# Patient Record
Sex: Male | Born: 1935 | Race: White | Hispanic: No | Marital: Married | Smoking: Former smoker
Health system: Southern US, Community
[De-identification: ages and names within clinical notes are randomized; demographics above are authoritative.]

## PROBLEM LIST (undated history)

## (undated) DIAGNOSIS — N200 Calculus of kidney: Secondary | ICD-10-CM

## (undated) DIAGNOSIS — E785 Hyperlipidemia, unspecified: Secondary | ICD-10-CM

## (undated) DIAGNOSIS — N3941 Urge incontinence: Secondary | ICD-10-CM

## (undated) DIAGNOSIS — C189 Malignant neoplasm of colon, unspecified: Secondary | ICD-10-CM

## (undated) DIAGNOSIS — I10 Essential (primary) hypertension: Secondary | ICD-10-CM

## (undated) HISTORY — DX: Malignant neoplasm of colon, unspecified: C18.9

## (undated) HISTORY — DX: Hyperlipidemia, unspecified: E78.5

## (undated) HISTORY — DX: Calculus of kidney: N20.0

## (undated) HISTORY — DX: Urge incontinence: N39.41

## (undated) HISTORY — PX: CATARACT EXTRACTION W/ INTRAOCULAR LENS  IMPLANT, BILATERAL: SHX1307

## (undated) HISTORY — DX: Essential (primary) hypertension: I10

---
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
## 1998-02-14 DIAGNOSIS — N200 Calculus of kidney: Secondary | ICD-10-CM

## 1998-02-14 HISTORY — DX: Calculus of kidney: N20.0

## 1998-02-14 HISTORY — PX: LITHOTRIPSY: SUR834

## 2001-02-14 DIAGNOSIS — C189 Malignant neoplasm of colon, unspecified: Secondary | ICD-10-CM

## 2001-02-14 HISTORY — PX: APPENDECTOMY: SHX54

## 2001-02-14 HISTORY — PX: PARTIAL COLECTOMY: SHX5273

## 2001-02-14 HISTORY — DX: Malignant neoplasm of colon, unspecified: C18.9

## 2009-02-14 HISTORY — PX: OTHER SURGICAL HISTORY: SHX169

## 2010-09-07 ENCOUNTER — Ambulatory Visit: Payer: Self-pay | Admitting: Internal Medicine

## 2010-10-08 ENCOUNTER — Encounter: Payer: Self-pay | Admitting: Internal Medicine

## 2010-10-08 ENCOUNTER — Ambulatory Visit (INDEPENDENT_AMBULATORY_CARE_PROVIDER_SITE_OTHER): Payer: Medicare Other | Admitting: Internal Medicine

## 2010-10-08 DIAGNOSIS — I358 Other nonrheumatic aortic valve disorders: Secondary | ICD-10-CM | POA: Insufficient documentation

## 2010-10-08 DIAGNOSIS — Z85038 Personal history of other malignant neoplasm of large intestine: Secondary | ICD-10-CM | POA: Insufficient documentation

## 2010-10-08 DIAGNOSIS — N3941 Urge incontinence: Secondary | ICD-10-CM

## 2010-10-08 DIAGNOSIS — I359 Nonrheumatic aortic valve disorder, unspecified: Secondary | ICD-10-CM

## 2010-10-08 DIAGNOSIS — N200 Calculus of kidney: Secondary | ICD-10-CM | POA: Insufficient documentation

## 2010-10-08 DIAGNOSIS — I10 Essential (primary) hypertension: Secondary | ICD-10-CM

## 2010-10-08 DIAGNOSIS — E785 Hyperlipidemia, unspecified: Secondary | ICD-10-CM | POA: Insufficient documentation

## 2010-10-08 DIAGNOSIS — E1140 Type 2 diabetes mellitus with diabetic neuropathy, unspecified: Secondary | ICD-10-CM | POA: Insufficient documentation

## 2010-10-08 DIAGNOSIS — E119 Type 2 diabetes mellitus without complications: Secondary | ICD-10-CM

## 2010-10-08 DIAGNOSIS — C189 Malignant neoplasm of colon, unspecified: Secondary | ICD-10-CM

## 2010-10-08 LAB — CBC WITH DIFFERENTIAL/PLATELET
Basophils Absolute: 0 10*3/uL (ref 0.0–0.1)
Hemoglobin: 13.1 g/dL (ref 13.0–17.0)
Lymphocytes Relative: 13 % (ref 12.0–46.0)
Monocytes Relative: 6.6 % (ref 3.0–12.0)
Platelets: 286 10*3/uL (ref 150.0–400.0)
RDW: 13.2 % (ref 11.5–14.6)

## 2010-10-08 LAB — BASIC METABOLIC PANEL
Calcium: 10 mg/dL (ref 8.4–10.5)
GFR: 71.27 mL/min (ref >60.00)
Glucose, Bld: 153 mg/dL — ABNORMAL HIGH (ref 70–99)
Sodium: 137 mEq/L (ref 135–145)

## 2010-10-08 LAB — TSH: TSH: 1.37 u[IU]/mL (ref 0.35–5.50)

## 2010-10-08 LAB — HEPATIC FUNCTION PANEL
Alkaline Phosphatase: 88 U/L (ref 39–117)
Bilirubin, Direct: 0.1 mg/dL (ref 0.0–0.3)

## 2010-10-08 LAB — MICROALBUMIN / CREATININE URINE RATIO
Creatinine,U: 135.6 mg/dL
Microalb, Ur: 7.2 mg/dL — ABNORMAL HIGH (ref 0.0–1.9)

## 2010-10-08 LAB — LIPID PANEL
HDL: 47.4 mg/dL (ref >39.00)
Total CHOL/HDL Ratio: 3

## 2010-10-08 NOTE — Assessment & Plan Note (Signed)
BP Readings from Last 3 Encounters:  10/08/10 126/62   Good control Due for labs

## 2010-10-08 NOTE — Assessment & Plan Note (Signed)
Mild Advised using pad for now when out of house

## 2010-10-08 NOTE — Assessment & Plan Note (Signed)
Previously undiagnosed Sounds like mild stenosis or sclerosis Will consider echo

## 2010-10-08 NOTE — Patient Instructions (Signed)
Please request your records for the past 3 years Get your shingles vaccine (zostavax) Please check your sugars 1-2 times per month fasting. Call if they are over 200 regularly

## 2010-10-08 NOTE — Assessment & Plan Note (Signed)
Seems to have good control but doesn't check i advised checking once or twice a month Will check labs

## 2010-10-08 NOTE — Progress Notes (Signed)
Subjective:    Patient ID: Shane Grant, male    DOB: 1931-12-18, 77 y.o.   MRN: [id number]  HPI Establishing here Moved to [facility] about 2.5 months ago  Has diabetes diagnosed some years ago Checked sugars in past but not much lately No hypoglycemic reactions  HTN has been controlled Diagnosed about 15 years ago  High cholesterol for 10 years or so No troubles with the med Has had good numbers  Has noted occ urinary incontinence Gets urgency and then wets underwear before he makes it to toilet Not too bad Kidney stones in past--lithotripsy in past Had been following with urology  Colonic polyps in 2003----cancer there Required partial colectomy then Last colonoscopy in 2010  No current outpatient prescriptions on file prior to visit.    Allergies  Allergen Reactions  . Ancef (Cefazolin Sodium) Nausea Only    Past Medical History  Diagnosis Date  . Hypertension ~1997  . Hyperlipidemia   . Diabetes mellitus   . Colon cancer 2003    Partial colectomy for cancer in polyp  . Kidney stones 2000    lithotripsy  . Urge incontinence of urine     Past Surgical History  Procedure Date  . Tonsillectomy and adenoidectomy 1947  . Appendectomy 2003    with colectomy  . Partial colectomy 2003    ascending colon  . Hepatic lesion 2011    Cryoablation by radiologist  . Lithotripsy 2000    Family History  Problem Relation Age of Onset  . Heart disease Neg Hx   . Hypertension Neg Hx   . Diabetes Neg Hx     History   Social History  . Marital Status: Married    Spouse Name: N/A    Number of Children: 3  . Years of Education: N/A   Occupational History  . Scientific laboratory technician   Social History Main Topics  . Smoking status: Former Smoker    Types: Cigars  . Smokeless tobacco: Never Used  . Alcohol Use: Yes     Glass of wine or scotch before dinner  . Drug Use: No  . Sexually Active: Not on file   Other Topics Concern  . Not  on file   Social History Narrative   3 sons all married with 2 childrenHas living willWife, then son Arline Asp, is health care POANot sure about DNRWould probably not want tube feedings   Review of Systems  Constitutional:       Weight stable Trying to exercise regularly Wears seat belt  HENT: Negative for hearing loss, dental problem and tinnitus.        Keeps up with dentist  Eyes: Negative for visual disturbance.       Vision okay with glasses   Respiratory: Negative for cough and shortness of breath.   Cardiovascular: Negative for chest pain, palpitations and leg swelling.  Gastrointestinal: Negative for nausea, vomiting, constipation and blood in stool.       No heartburn  Genitourinary: Positive for decreased urine volume. Negative for frequency and difficulty urinating.       Weak urine flow occ incontinence  Musculoskeletal: Positive for arthralgias. Negative for back pain and joint swelling.       Occ knee pain--esp after all the moving Ibuprofen 200-400 really helps  Skin: Negative for rash.       No suspicious areas  Neurological: Positive for dizziness. Negative for syncope, light-headedness and headaches.       Mild orthostatic dizziness  at times   Hematological: Negative for adenopathy. Does not bruise/bleed easily.  Psychiatric/Behavioral: Negative for sleep disturbance and dysphoric mood. The patient is not nervous/anxious.        Objective:   Physical Exam  Constitutional: He appears well-developed and well-nourished. No distress.  HENT:  Mouth/Throat: Oropharynx is clear and moist. No oropharyngeal exudate.  Eyes: Conjunctivae and EOM are normal. Pupils are equal, round, and reactive to light.  Neck: Normal range of motion. Neck supple. No JVD present. No thyromegaly present.       No carotid bruits  Cardiovascular: Normal rate, regular rhythm and intact distal pulses.  Exam reveals no gallop.   Murmur heard.      Gr 2/6 aortic systolic murmur radiating to  right carotid  Pulmonary/Chest: Effort normal and breath sounds normal. No respiratory distress. He has no wheezes. He has no rales.  Abdominal: Soft. There is no tenderness.  Musculoskeletal: Normal range of motion. He exhibits edema. He exhibits no tenderness.       Trace right foot and ankle edema  Lymphadenopathy:    He has no cervical adenopathy.  Neurological:       Mild decreased sensation on right plantar foot  Skin: Skin is warm. No rash noted.       No lesions  Psychiatric: He has a normal mood and affect. His behavior is normal. Judgment and thought content normal.          Assessment & Plan:

## 2010-10-08 NOTE — Assessment & Plan Note (Signed)
No problems with med Will check labs 

## 2010-10-08 NOTE — Assessment & Plan Note (Signed)
Due for colonoscopy probably next year Will check records

## 2010-10-28 ENCOUNTER — Other Ambulatory Visit: Payer: Self-pay | Admitting: *Deleted

## 2010-10-28 MED ORDER — LISINOPRIL 10 MG PO TABS: 10.0000 mg | ORAL_TABLET | Freq: Every day | ORAL | Status: DC

## 2010-11-22 ENCOUNTER — Encounter: Payer: Self-pay | Admitting: Family Medicine

## 2010-11-22 ENCOUNTER — Ambulatory Visit (INDEPENDENT_AMBULATORY_CARE_PROVIDER_SITE_OTHER)
Admission: RE | Admit: 2010-11-22 | Discharge: 2010-11-22 | Disposition: A | Discharge status: Home / Self Care | Payer: Medicare Other | Source: Ambulatory Visit | Attending: Family Medicine | Admitting: Family Medicine

## 2010-11-22 ENCOUNTER — Ambulatory Visit (INDEPENDENT_AMBULATORY_CARE_PROVIDER_SITE_OTHER): Payer: Medicare Other | Admitting: Family Medicine

## 2010-11-22 VITALS — BP 112/80 | HR 78 | Temp 98.0°F | Ht 78.0 in | Wt 276.0 lb

## 2010-11-22 DIAGNOSIS — M25569 Pain in unspecified knee: Secondary | ICD-10-CM

## 2010-11-22 DIAGNOSIS — M25561 Pain in right knee: Secondary | ICD-10-CM

## 2010-11-22 MED ORDER — DICLOFENAC SODIUM 75 MG PO TBEC: 75.0000 mg | DELAYED_RELEASE_TABLET | Freq: Two times a day (BID) | ORAL | Status: DC

## 2010-11-22 NOTE — Progress Notes (Signed)
  Subjective:    Patient ID: Shane Grant, male    DOB: 1931-08-22, 77 y.o.   MRN: [id number]  HPI  Shane Grant, a 77 y.o. male presents today in the office for the following:    R knee pain: In June, moved from [place], at the end of the month, knee was bothering him a lot. Hurts a lot to sit down, feels week, feels like it will give way. He is in a mild degree of swelling. He has mainly pain medially, but primarily is having pain with forced flexion. He was able to play golf to 3 weeks ago when he tried. Pain when going up and down and walking up hills.  The PMH, PSH, Social History, Family History, Medications, and allergies have been reviewed in [facility], and have been updated if relevant.   Review of Systems REVIEW OF SYSTEMS  GEN: No fevers, chills. Nontoxic. Primarily MSK c/o today. MSK: Detailed in the HPI GI: tolerating PO intake without difficulty Neuro: No numbness, parasthesias, or tingling associated. Otherwise the pertinent positives of the ROS are noted above.      Objective:   Physical Exam   Physical Exam  Blood pressure 112/80, pulse 78, temperature 98 F (36.7 C), temperature source Oral, height 6\' 6"  (1.981 m), weight 276 lb (125.193 kg), SpO2 97.00%.  GEN: Well-developed,well-nourished,in no acute distress; alert,appropriate and cooperative throughout examination HEENT: Normocephalic and atraumatic without obvious abnormalities. Ears, externally no deformities PULM: Breathing comfortably in no respiratory distress EXT: No clubbing, cyanosis, or edema PSYCH: Normally interactive. Cooperative during the interview. Pleasant. Friendly and conversant. Not anxious or depressed appearing. Normal, full affect.  Gait: mild limp ROM: loss of 3 deg to 115 Effusion: mild effusion Echymosis or edema: none Patellar tendon NT Painful PLICA: neg Patellar grind: negative Medial and lateral patellar facet loading: negative medial and lateral joint  lines: ttp medial joint line Mcmurray's pain only Flexion-pinch pos Varus and valgus stress: stable Lachman: neg Ant and Post drawer: neg Hip abduction, IR, ER: WNL Hip flexion str: 5/5 Hip abd: 5/5 Quad: 5/5 VMO atrophy: mild Hamstring concentric and eccentric: 5/5       Assessment & Plan:   1. Right knee pain  DG Knee Bilateral Standing AP, DG Knee 1-2 Views Right    X-rays: AP Bilateral Weight-bearing, Weightbearing Lateral, Sunrise views Indication: knee pain Findings:  Mild deg tricompartmental OA  Change to voltaren, inject knee OA flare vs. Degenerative mensical tear  Knee Injection,R Patient verbally consented to procedure. Risks, benefits, and alternatives explained. Sterilely prepped with betadine. Ethyl cholride used for anesthesia. 9 cc Lidocaine 1% mixed with 1 cc of Depo-medrol 40 mg injected using the anterolateral approach without difficulty. No complications with procedure and tolerated well. Patient had decreased pain post-injection.

## 2010-11-22 NOTE — Patient Instructions (Signed)
Recheck in 1 month with Dr. Patsy Lager

## 2010-12-22 ENCOUNTER — Ambulatory Visit (INDEPENDENT_AMBULATORY_CARE_PROVIDER_SITE_OTHER): Payer: Medicare Other | Admitting: Family Medicine

## 2010-12-22 ENCOUNTER — Encounter: Payer: Self-pay | Admitting: Family Medicine

## 2010-12-22 VITALS — BP 118/64 | HR 92 | Temp 98.4°F | Wt 281.0 lb

## 2010-12-22 DIAGNOSIS — M25569 Pain in unspecified knee: Secondary | ICD-10-CM

## 2010-12-22 DIAGNOSIS — M25561 Pain in right knee: Secondary | ICD-10-CM

## 2010-12-22 NOTE — Progress Notes (Signed)
  Subjective:    Patient ID: Shane Grant, male    DOB: 14-Mar-1931, 77 y.o.   MRN: [id number]  HPI  Shane Grant, a 77 y.o. male presents today in the office for the following:    F/u R knee pain, s/p intraarticular injection, injury 6-8 weeks ago.  Every once in a while will hurt a little bit. Overall, he is doing much better. He played 318 holes of golf rounds a week or so after I saw him last. He has no functional limitation, he is walking on the treadmill and doing elliptical as well as drug abuse and quadriceps strengthening.   Review of Systems REVIEW OF SYSTEMS  GEN: No fevers, chills. Nontoxic. Primarily MSK c/o today. MSK: Detailed in the HPI GI: tolerating PO intake without difficulty Neuro: No numbness, parasthesias, or tingling associated. Otherwise the pertinent positives of the ROS are noted above.      Objective:   Physical Exam   Physical Exam  Blood pressure 118/64, pulse 92, temperature 98.4 F (36.9 C), temperature source Oral, weight 281 lb (127.461 kg).  GEN: WDWN, NAD, Non-toxic, A & O x 3 HEENT: Atraumatic, Normocephalic. Neck supple. No masses, No LAD. Ears and Nose: No external deformity. EXTR: No c/c/e NEURO Normal gait.  PSYCH: Normally interactive. Conversant. Not depressed or anxious appearing.  Calm demeanor.   Right knee: Mild effusion. Loss of about 40 of extension. Flexion to 115. Nontender on either joint line. Nontender with patellar compression. Stable anterior cruciate ligament, PCL, MCL, LCL. Nontender with a McMurray's test and bounce home test, as well as flexion pinched      Assessment & Plan:   Right knee: Pain is improved. At this point, think that all he needs to do his focus on quadriceps strengthening and being active. Follow up when necessary.

## 2011-01-25 ENCOUNTER — Other Ambulatory Visit: Payer: Self-pay | Admitting: Internal Medicine

## 2011-01-31 ENCOUNTER — Other Ambulatory Visit: Payer: Self-pay | Admitting: *Deleted

## 2011-01-31 MED ORDER — ATORVASTATIN CALCIUM 80 MG PO TABS: 80.0000 mg | ORAL_TABLET | Freq: Every day | ORAL | Status: DC

## 2011-03-18 LAB — HM DIABETES EYE EXAM

## 2011-03-28 DIAGNOSIS — H2510 Age-related nuclear cataract, unspecified eye: Secondary | ICD-10-CM | POA: Diagnosis not present

## 2011-03-31 ENCOUNTER — Ambulatory Visit (INDEPENDENT_AMBULATORY_CARE_PROVIDER_SITE_OTHER): Payer: Medicare Other | Admitting: Internal Medicine

## 2011-03-31 ENCOUNTER — Encounter: Payer: Self-pay | Admitting: Internal Medicine

## 2011-03-31 VITALS — BP 116/60 | HR 84 | Temp 98.1°F | Ht 78.0 in | Wt 276.0 lb

## 2011-03-31 DIAGNOSIS — I358 Other nonrheumatic aortic valve disorders: Secondary | ICD-10-CM

## 2011-03-31 DIAGNOSIS — I359 Nonrheumatic aortic valve disorder, unspecified: Secondary | ICD-10-CM

## 2011-03-31 DIAGNOSIS — E119 Type 2 diabetes mellitus without complications: Secondary | ICD-10-CM

## 2011-03-31 DIAGNOSIS — I10 Essential (primary) hypertension: Secondary | ICD-10-CM

## 2011-03-31 DIAGNOSIS — E785 Hyperlipidemia, unspecified: Secondary | ICD-10-CM

## 2011-03-31 MED ORDER — LISINOPRIL 10 MG PO TABS: 10.0000 mg | ORAL_TABLET | Freq: Every day | ORAL | Status: DC

## 2011-03-31 NOTE — Progress Notes (Signed)
Subjective:    Patient ID: Shane Grant, male    DOB: 03-24-1931, 78 y.o.   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 aspirin 81 MG tablet Take 81 mg by mouth daily.        Marland Kitchen atorvastatin (LIPITOR) 80 MG tablet Take 1 tablet (80 mg total) by mouth daily.  90 tablet  1  . glipiZIDE-metformin (METAGLIP) 5-500 MG per tablet take 1 tablet by mouth twice a day  60 tablet  2    Allergies  Allergen Reactions  . Ancef (Cefazolin Sodium) Nausea Only    Past Medical History  Diagnosis Date  . Hypertension ~1997  . Hyperlipidemia   . Diabetes mellitus   . Colon cancer 2003    Partial colectomy for cancer in polyp  . Kidney stones 2000    lithotripsy  . Urge incontinence of urine     Past Surgical History  Procedure Date  . Tonsillectomy and adenoidectomy 1947  . Appendectomy 2003    with colectomy  . Partial colectomy 2003    ascending colon  . Hepatic lesion 2011    Cryoablation by radiologist  . Lithotripsy 2000    Family History  Problem Relation Age of Onset  . Heart disease Neg Hx   . Hypertension Neg Hx   . Diabetes Neg Hx     History   Social History  . Marital Status: Married    Spouse Name: N/A    Number of Children: 3  . Years of Education: N/A   Occupational History  . Media planner   Social History Main Topics  . Smoking status: Former Smoker    Types: Cigars  . Smokeless tobacco: Never Used  . Alcohol Use: Yes     Glass of wine or scotch before dinner  . Drug Use: No  . Sexually Active: Not on file   Other Topics Concern  . Not on file   Social History Narrative   3 sons all married with 2 childrenHas living willWife, then son Arline Asp, is health care POANot sure about DNRWould probably not want tube feedings   Review of Systems Sleeps well Appetite is down slightly  Weight stable No dizziness or syncope     Objective:   Physical Exam  Constitutional: He appears well-developed and well-nourished. No distress.  Neck: Normal range of motion. Neck supple.  Cardiovascular: Normal rate, regular rhythm and intact distal pulses.  Exam reveals no gallop.   Murmur heard.      Gr 2/6 aortic murmur  Pulmonary/Chest: Effort normal and breath sounds normal. No respiratory distress. He has no wheezes. He has no rales.  Musculoskeletal: He exhibits no edema and no tenderness.  Lymphadenopathy:    He has no cervical adenopathy.  Skin:       Mycotic great toenails  Psychiatric:  He has a normal mood and affect. His behavior is normal. Judgment and thought content normal.          Assessment & Plan:

## 2011-03-31 NOTE — Assessment & Plan Note (Signed)
Lab Results  Component Value Date   HGBA1C 8.1* 10/08/2010   Not quite at goal Discussed lifestyle changes and weight loss

## 2011-03-31 NOTE — Assessment & Plan Note (Signed)
Lab Results  Component Value Date   LDLCALC 74 10/08/2010   At goal on med No apparent side effects

## 2011-03-31 NOTE — Assessment & Plan Note (Signed)
AS or sclerosis No symptoms so will hold off on echo

## 2011-03-31 NOTE — Assessment & Plan Note (Signed)
BP Readings from Last 3 Encounters:  03/31/11 116/60  12/22/10 118/64  11/22/10 112/80   Doing well No changes needed Lab Results  Component Value Date   CREATININE 1.1 10/08/2010

## 2011-04-01 LAB — HEMOGLOBIN A1C: Hgb A1c MFr Bld: 8.3 % — ABNORMAL HIGH (ref 4.6–6.5)

## 2011-04-21 ENCOUNTER — Other Ambulatory Visit: Payer: Self-pay | Admitting: Internal Medicine

## 2011-04-25 ENCOUNTER — Other Ambulatory Visit: Payer: Self-pay | Admitting: Internal Medicine

## 2011-07-21 ENCOUNTER — Other Ambulatory Visit: Payer: Self-pay | Admitting: Internal Medicine

## 2011-07-26 ENCOUNTER — Other Ambulatory Visit: Payer: Self-pay | Admitting: Internal Medicine

## 2011-10-03 ENCOUNTER — Encounter: Payer: Self-pay | Admitting: Internal Medicine

## 2011-10-03 ENCOUNTER — Ambulatory Visit (INDEPENDENT_AMBULATORY_CARE_PROVIDER_SITE_OTHER): Payer: Medicare Other | Admitting: Internal Medicine

## 2011-10-03 VITALS — BP 110/70 | HR 79 | Temp 97.9°F | Ht 78.0 in | Wt 260.0 lb

## 2011-10-03 DIAGNOSIS — E785 Hyperlipidemia, unspecified: Secondary | ICD-10-CM

## 2011-10-03 DIAGNOSIS — E119 Type 2 diabetes mellitus without complications: Secondary | ICD-10-CM | POA: Diagnosis not present

## 2011-10-03 DIAGNOSIS — I10 Essential (primary) hypertension: Secondary | ICD-10-CM

## 2011-10-03 DIAGNOSIS — Z0000 Encounter for general adult medical examination without abnormal findings: Secondary | ICD-10-CM | POA: Insufficient documentation

## 2011-10-03 DIAGNOSIS — IMO0002 Reserved for concepts with insufficient information to code with codable children: Secondary | ICD-10-CM | POA: Diagnosis not present

## 2011-10-03 DIAGNOSIS — S86911A Strain of unspecified muscle(s) and tendon(s) at lower leg level, right leg, initial encounter: Secondary | ICD-10-CM | POA: Insufficient documentation

## 2011-10-03 DIAGNOSIS — I358 Other nonrheumatic aortic valve disorders: Secondary | ICD-10-CM

## 2011-10-03 DIAGNOSIS — I359 Nonrheumatic aortic valve disorder, unspecified: Secondary | ICD-10-CM

## 2011-10-03 NOTE — Progress Notes (Signed)
Subjective:    Patient ID: Shane Grant, male    DOB: 07-30-1931, 78 y.o.   MRN: [id number]  HPI Here for Wellness visit and regular check up Reviewed his other physicians No depression or anhedonia Independent with ADLs and instrumental ADLs Continues to work out regularly--gym at [employer] No falls or instability Former smoker. Does drink alcohol regularly Vision is fine. Some hearing loss No cognitive problems  Has ongoing right knee pain Able to work out but it still hurts Had originally injured it a year ago while moving in Interested in PT  Checks sugars randomly---usually 11AM Now down in 150-160 range No low sugar reactions Did have eye exam this year  No chest pain No SOB No syncope but occ can have mild orthostatic dizziness  No myalgia or stomach problems on statin  Current Outpatient Prescriptions on File Prior to Visit  Medication Sig Dispense Refill  . aspirin 81 MG tablet Take 81 mg by mouth daily.        Marland Kitchen atorvastatin (LIPITOR) 80 MG tablet take 1 tablet by mouth once daily  90 tablet  1  . glipiZIDE-metformin (METAGLIP) 5-500 MG per tablet take 1 tablet by mouth twice a day  60 tablet  2  . lisinopril (PRINIVIL,ZESTRIL) 10 MG tablet take 1 tablet by mouth once daily  90 tablet  1  . polyethylene glycol (MIRALAX / GLYCOLAX) packet Take 17 g by mouth daily as needed.        Allergies  Allergen Reactions  . Ancef (Cefazolin Sodium) Nausea Only    Past Medical History  Diagnosis Date  . Hypertension ~1997  . Hyperlipidemia   . Diabetes mellitus   . Colon cancer 2003    Partial colectomy for cancer in polyp  . Kidney stones 2000    lithotripsy  . Urge incontinence of urine     Past Surgical History  Procedure Date  . Tonsillectomy and adenoidectomy 1947  . Appendectomy 2003    with colectomy  . Partial colectomy 2003    ascending colon  . Hepatic lesion 2011    Cryoablation by radiologist  . Lithotripsy 2000    Family  History  Problem Relation Age of Onset  . Heart disease Neg Hx   . Hypertension Neg Hx   . Diabetes Neg Hx     History   Social History  . Marital Status: Married    Spouse Name: N/A    Number of Children: 3  . Years of Education: N/A   Occupational History  . Scientific laboratory technician   Social History Main Topics  . Smoking status: Former Smoker    Types: Cigars  . Smokeless tobacco: Never Used  . Alcohol Use: Yes     Glass of wine or scotch before dinner  . Drug Use: No  . Sexually Active: Not on file   Other Topics Concern  . Not on file   Social History Narrative   3 sons all married with 2 childrenHas living willWife, then son Arline Asp, is health care POAWould accept resuscitation but no prolonged artificial ventilationWould probably not want tube feedings   Review of Systems Weight is down 16-20#--really working on fitness Sleeps well Bowels are regular using miralax 2-3 times per week    Objective:   Physical Exam  Constitutional: He is oriented to person, place, and time. He appears well-developed and well-nourished. No distress.  Neck: Normal range of motion. Neck supple. No thyromegaly present.  Cardiovascular: Normal rate,  regular rhythm and intact distal pulses.  Exam reveals no gallop.   Murmur heard.      Same Gr2-3/6 aortic systolic murmur towards right carotid  Pulmonary/Chest: Effort normal and breath sounds normal. No respiratory distress. He has no wheezes. He has no rales.  Abdominal: Soft. There is no tenderness.  Musculoskeletal: He exhibits no edema and no tenderness.       Fairly normal ROM of right knee No effusion No sig crepitus  Lymphadenopathy:    He has no cervical adenopathy.  Neurological: He is alert and oriented to person, place, and time.       President-- "Obama, Bush, Clinton" [phone] [id number] Recall 2/3  Psychiatric: He has a normal mood and affect. His behavior is normal. Thought content normal.           Assessment & Plan:

## 2011-10-03 NOTE — Assessment & Plan Note (Signed)
Seems to have better control with weight loss Discussed checking fasting sugars Due for labs

## 2011-10-03 NOTE — Assessment & Plan Note (Signed)
Still no symptoms Discussed whether we should do echo We agree to hold off unless he has symptoms

## 2011-10-03 NOTE — Assessment & Plan Note (Signed)
Lab Results  Component Value Date   LDLCALC 74 10/08/2010   No problems with the statin Due for labs

## 2011-10-03 NOTE — Assessment & Plan Note (Signed)
Mostly some pain on treadmill Discussed trying elliptical instead Will refer to PT if ongoing problems

## 2011-10-03 NOTE — Assessment & Plan Note (Signed)
I have personally reviewed the Medicare Annual Wellness questionnaire and have noted 1. The patient's medical and social history 2. Their use of alcohol, tobacco or illicit drugs 3. Their current medications and supplements 4. The patient's functional ability including ADL's, fall risks, home safety risks and hearing or visual             impairment. 5. Diet and physical activities 6. Evidence for depression or mood disorders  The patients weight, height, BMI and visual acuity have been recorded in the chart I have made referrals, counseling and provided education to the patient based review of the above and I have provided the pt with a written personalized care plan for preventive services.  I have provided you with a copy of your personalized plan for preventive services. Please take the time to review along with your updated medication list.  No sig concerns Due for colonoscopy in follow up in next couple of years

## 2011-10-03 NOTE — Assessment & Plan Note (Signed)
BP Readings from Last 3 Encounters:  10/03/11 110/70  03/31/11 116/60  12/22/10 118/64   Good control No changes needed

## 2011-10-04 ENCOUNTER — Encounter: Payer: Self-pay | Admitting: *Deleted

## 2011-10-04 LAB — LIPID PANEL
LDL Cholesterol: 80 mg/dL (ref 0–99)
Total CHOL/HDL Ratio: 3

## 2011-10-04 LAB — CBC WITH DIFFERENTIAL/PLATELET
Basophils Relative: 0.3 % (ref 0.0–3.0)
Eosinophils Relative: 3.5 % (ref 0.0–5.0)
HCT: 38.4 % — ABNORMAL LOW (ref 39.0–52.0)
Hemoglobin: 12.7 g/dL — ABNORMAL LOW (ref 13.0–17.0)
Lymphs Abs: 1.5 10*3/uL (ref 0.7–4.0)
MCV: 92.2 fl (ref 78.0–100.0)
Monocytes Absolute: 0.8 10*3/uL (ref 0.1–1.0)
Neutro Abs: 8.7 10*3/uL — ABNORMAL HIGH (ref 1.4–7.7)
Neutrophils Relative %: 75.8 % (ref 43.0–77.0)
RBC: 4.16 Mil/uL — ABNORMAL LOW (ref 4.22–5.81)
WBC: 11.5 10*3/uL — ABNORMAL HIGH (ref 4.5–10.5)

## 2011-10-04 LAB — BASIC METABOLIC PANEL
Chloride: 106 mEq/L (ref 96–112)
Potassium: 4.7 mEq/L (ref 3.5–5.1)
Sodium: 139 mEq/L (ref 135–145)

## 2011-10-04 LAB — TSH: TSH: 1.79 u[IU]/mL (ref 0.35–5.50)

## 2011-10-04 LAB — HEPATIC FUNCTION PANEL
Albumin: 4 g/dL (ref 3.5–5.2)
Total Bilirubin: 0.8 mg/dL (ref 0.3–1.2)

## 2011-10-18 ENCOUNTER — Other Ambulatory Visit: Payer: Self-pay | Admitting: Internal Medicine

## 2011-12-13 DIAGNOSIS — Z23 Encounter for immunization: Secondary | ICD-10-CM | POA: Diagnosis not present

## 2012-01-19 ENCOUNTER — Other Ambulatory Visit: Payer: Self-pay | Admitting: Internal Medicine

## 2012-01-29 ENCOUNTER — Other Ambulatory Visit: Payer: Self-pay | Admitting: Internal Medicine

## 2012-02-28 ENCOUNTER — Other Ambulatory Visit: Payer: Self-pay | Admitting: *Deleted

## 2012-02-28 MED ORDER — LISINOPRIL 10 MG PO TABS: 10.0000 mg | ORAL_TABLET | Freq: Every day | ORAL | Status: DC

## 2012-04-04 ENCOUNTER — Encounter: Payer: Self-pay | Admitting: Internal Medicine

## 2012-04-04 ENCOUNTER — Ambulatory Visit (INDEPENDENT_AMBULATORY_CARE_PROVIDER_SITE_OTHER): Payer: Medicare Other | Admitting: Internal Medicine

## 2012-04-04 VITALS — BP 128/70 | HR 77 | Temp 98.3°F | Ht 78.0 in | Wt 267.0 lb

## 2012-04-04 DIAGNOSIS — I10 Essential (primary) hypertension: Secondary | ICD-10-CM | POA: Diagnosis not present

## 2012-04-04 LAB — HM DIABETES FOOT EXAM

## 2012-04-04 MED ORDER — ATORVASTATIN CALCIUM 80 MG PO TABS: 80.0000 mg | ORAL_TABLET | Freq: Every day | ORAL | Status: DC

## 2012-04-04 MED ORDER — GLIPIZIDE 5 MG PO TABS: 5.0000 mg | ORAL_TABLET | Freq: Two times a day (BID) | ORAL | Status: DC

## 2012-04-04 MED ORDER — LISINOPRIL 10 MG PO TABS: 10.0000 mg | ORAL_TABLET | Freq: Every day | ORAL | Status: DC

## 2012-04-04 MED ORDER — METFORMIN HCL 500 MG PO TABS: 500.0000 mg | ORAL_TABLET | Freq: Two times a day (BID) | ORAL | Status: DC

## 2012-04-04 NOTE — Assessment & Plan Note (Signed)
BP Readings from Last 3 Encounters:  04/04/12 128/70  10/03/11 110/70  03/31/11 116/60   Good control No changes needed

## 2012-04-04 NOTE — Assessment & Plan Note (Signed)
Has had some dizziness ?mitral murmur also Will check echo

## 2012-04-04 NOTE — Addendum Note (Signed)
Addended by: Sueanne Margarita on: 04/04/2012 08:51 AM   Modules accepted: Orders

## 2012-04-04 NOTE — Assessment & Plan Note (Signed)
Hopefully still good control Goal under 8% Action if close or over 9%

## 2012-04-04 NOTE — Assessment & Plan Note (Signed)
May be arthritic or calcific tendonitis Discussed exercise with weights and stretching PT eval if worsens

## 2012-04-04 NOTE — Progress Notes (Signed)
Subjective:    Patient ID: Shane Grant, male    DOB: 1932/01/21, 79 y.o.   MRN: [id number]  HPI Doing well in general  Has had some pain in right shoulder Hurts when externally rotating fully or extending upwards Hasn't had to stop any activities  Right knee is "coming along very slowly" Stiff and some pain when first getting up after prolonged sitting or going up steps Still does exercise program 3 times per week---now on bicycle instead of treadmill  Checks sugars occasionally Has been higher recently---but erratic Average 200 but not fasting No hypoglycemic  No chest pain No SOB Stable exercise tolerance Occ mild dizziness but no syncope  Current Outpatient Prescriptions on File Prior to Visit  Medication Sig Dispense Refill  . aspirin 81 MG tablet Take 81 mg by mouth daily.        Marland Kitchen atorvastatin (LIPITOR) 80 MG tablet take 1 tablet by mouth once daily  90 tablet  1  . lisinopril (PRINIVIL,ZESTRIL) 10 MG tablet Take 1 tablet (10 mg total) by mouth daily.  90 tablet  3  . polyethylene glycol (MIRALAX / GLYCOLAX) packet Take 17 g by mouth daily as needed.       No current facility-administered medications on file prior to visit.    Allergies  Allergen Reactions  . Ancef (Cefazolin Sodium) Nausea Only    Past Medical History  Diagnosis Date  . Hypertension ~1997  . Hyperlipidemia   . Diabetes mellitus   . Colon cancer 2003    Partial colectomy for cancer in polyp  . Kidney stones 2000    lithotripsy  . Urge incontinence of urine     Past Surgical History  Procedure Laterality Date  . Tonsillectomy and adenoidectomy  1947  . Appendectomy  2003    with colectomy  . Partial colectomy  2003    ascending colon  . Hepatic lesion  2011    Cryoablation by radiologist  . Lithotripsy  2000    Family History  Problem Relation Age of Onset  . Heart disease Neg Hx   . Hypertension Neg Hx   . Diabetes Neg Hx     History   Social History  .  Marital Status: Married    Spouse Name: N/A    Number of Children: 3  . Years of Education: N/A   Occupational History  . Scientific laboratory technician   Social History Main Topics  . Smoking status: Former Smoker    Types: Cigars  . Smokeless tobacco: Never Used  . Alcohol Use: Yes     Comment: Glass of wine or scotch before dinner  . Drug Use: No  . Sexually Active: Not on file   Other Topics Concern  . Not on file   Social History Narrative   3 sons all married with 2 children   Has living will   Wife, then son Arline Asp, is health care POA   Would accept resuscitation but no prolonged artificial ventilation   Would probably not want tube feedings   Review of Systems Appetite is fine Weight is up a few pounds---?winter Sleeps well Mood is good     Objective:   Physical Exam  Constitutional: He appears well-developed and well-nourished. No distress.  Neck: Normal range of motion. Neck supple. No thyromegaly present.  Cardiovascular: Normal rate, regular rhythm and intact distal pulses.  Exam reveals no gallop.   Murmur heard. Has what may be 2 separate murmurs---aortic and mitral  Pulmonary/Chest:  Effort normal and breath sounds normal. No respiratory distress. He has no wheezes. He has no rales.  Musculoskeletal: He exhibits no edema and no tenderness.  Decreased abduction and external rotation of right shoulder without crepitus  Lymphadenopathy:    He has no cervical adenopathy.  Skin: No rash noted. No erythema.  No foot lesions  Psychiatric: He has a normal mood and affect. His behavior is normal.          Assessment & Plan:

## 2012-04-05 LAB — HM DIABETES EYE EXAM

## 2012-04-10 ENCOUNTER — Telehealth: Payer: Self-pay | Admitting: *Deleted

## 2012-04-10 MED ORDER — METFORMIN HCL ER 500 MG PO TB24: 500.0000 mg | ORAL_TABLET | Freq: Every day | ORAL | Status: DC

## 2012-04-10 NOTE — Telephone Encounter (Signed)
Spoke with patient and advised results rx sent to pharmacy by e-script Lab appt scheduled  

## 2012-04-10 NOTE — Telephone Encounter (Signed)
Message copied by Sueanne Margarita on Tue Apr 10, 2012  4:38 PM ------      Message from: [name]      Created: Wed Apr 04, 2012 12:11 PM       Please call him      His diabetes control has seriously worsened-- A1c up to 9.3%      Unfortunately, that means it is time to start meds      Please have him start metformin ER 500mg  with breakfast (#30 x 11)      Set up appt for 3 months to recheck on his control and labs      He needs to be more careful with his eating also ------

## 2012-04-11 ENCOUNTER — Telehealth: Payer: Self-pay

## 2012-04-11 NOTE — Telephone Encounter (Signed)
Pt left v/m pt thought was to increase metformin but when picked up med instructions read take once daily and that is half of what pt was taking.(Pt was taking Metformin 500 mg twice a day). Please clarify.

## 2012-04-11 NOTE — Telephone Encounter (Signed)
His metformin was not on his listed---somehow deleted. Not picked up on med reconciliation unfortunately  Apologize and tell him somehow we had dropped his metformin from his list and I didn't realize he was already on it. Please increase his metformin to 1000mg  bid (1 year Rx) He can use the rest of his 500mg  tabs (including the new ones) taking 2 bid Sorry for the misunderstanding and extra prescription Remind him that we are relying on him to check the list we give him to make sure everything is on it

## 2012-04-13 ENCOUNTER — Encounter: Payer: Self-pay | Admitting: Internal Medicine

## 2012-04-13 MED ORDER — METFORMIN HCL 1000 MG PO TABS: 1000.0000 mg | ORAL_TABLET | Freq: Two times a day (BID) | ORAL | Status: DC

## 2012-04-13 NOTE — Telephone Encounter (Signed)
Spoke with patient and advised results rx sent to pharmacy by e-script Patient understood

## 2012-04-27 ENCOUNTER — Other Ambulatory Visit: Payer: Self-pay

## 2012-04-27 ENCOUNTER — Other Ambulatory Visit (INDEPENDENT_AMBULATORY_CARE_PROVIDER_SITE_OTHER): Payer: Medicare Other

## 2012-04-27 DIAGNOSIS — I359 Nonrheumatic aortic valve disorder, unspecified: Secondary | ICD-10-CM

## 2012-05-08 ENCOUNTER — Other Ambulatory Visit: Payer: Medicare Other

## 2012-07-02 ENCOUNTER — Other Ambulatory Visit (INDEPENDENT_AMBULATORY_CARE_PROVIDER_SITE_OTHER): Payer: Medicare Other

## 2012-07-02 DIAGNOSIS — E119 Type 2 diabetes mellitus without complications: Secondary | ICD-10-CM | POA: Diagnosis not present

## 2012-10-03 ENCOUNTER — Ambulatory Visit (INDEPENDENT_AMBULATORY_CARE_PROVIDER_SITE_OTHER): Payer: Medicare Other | Admitting: Internal Medicine

## 2012-10-03 ENCOUNTER — Encounter: Payer: Self-pay | Admitting: Internal Medicine

## 2012-10-03 VITALS — BP 112/68 | HR 77 | Temp 98.1°F | Ht 78.0 in | Wt 263.0 lb

## 2012-10-03 DIAGNOSIS — Z0000 Encounter for general adult medical examination without abnormal findings: Secondary | ICD-10-CM

## 2012-10-03 DIAGNOSIS — I10 Essential (primary) hypertension: Secondary | ICD-10-CM | POA: Diagnosis not present

## 2012-10-03 DIAGNOSIS — E1149 Type 2 diabetes mellitus with other diabetic neurological complication: Secondary | ICD-10-CM

## 2012-10-03 DIAGNOSIS — N3941 Urge incontinence: Secondary | ICD-10-CM | POA: Diagnosis not present

## 2012-10-03 DIAGNOSIS — E785 Hyperlipidemia, unspecified: Secondary | ICD-10-CM

## 2012-10-03 DIAGNOSIS — I359 Nonrheumatic aortic valve disorder, unspecified: Secondary | ICD-10-CM

## 2012-10-03 DIAGNOSIS — I358 Other nonrheumatic aortic valve disorders: Secondary | ICD-10-CM

## 2012-10-03 LAB — CBC WITH DIFFERENTIAL/PLATELET
Basophils Relative: 0.3 % (ref 0.0–3.0)
Eosinophils Relative: 3 % (ref 0.0–5.0)
HCT: 38.2 % — ABNORMAL LOW (ref 39.0–52.0)
Hemoglobin: 13.1 g/dL (ref 13.0–17.0)
Lymphs Abs: 1.4 10*3/uL (ref 0.7–4.0)
MCV: 90.1 fl (ref 78.0–100.0)
Monocytes Absolute: 0.8 10*3/uL (ref 0.1–1.0)
Neutro Abs: 8.9 10*3/uL — ABNORMAL HIGH (ref 1.4–7.7)
Platelets: 297 10*3/uL (ref 150.0–400.0)
RBC: 4.24 Mil/uL (ref 4.22–5.81)
WBC: 11.5 10*3/uL — ABNORMAL HIGH (ref 4.5–10.5)

## 2012-10-03 LAB — HEPATIC FUNCTION PANEL
ALT: 23 U/L (ref 0–53)
AST: 15 U/L (ref 0–37)
Albumin: 3.9 g/dL (ref 3.5–5.2)
Total Protein: 7.2 g/dL (ref 6.0–8.3)

## 2012-10-03 LAB — LIPID PANEL
Cholesterol: 152 mg/dL (ref 0–200)
LDL Cholesterol: 81 mg/dL (ref 0–99)

## 2012-10-03 LAB — BASIC METABOLIC PANEL
BUN: 25 mg/dL — ABNORMAL HIGH (ref 6–23)
Chloride: 101 mEq/L (ref 96–112)
Potassium: 4.6 mEq/L (ref 3.5–5.1)

## 2012-10-03 LAB — HEMOGLOBIN A1C: Hgb A1c MFr Bld: 7.9 % — ABNORMAL HIGH (ref 4.6–6.5)

## 2012-10-03 NOTE — Assessment & Plan Note (Signed)
No problems with med.

## 2012-10-03 NOTE — Assessment & Plan Note (Signed)
I have personally reviewed the Medicare Annual Wellness questionnaire and have noted 1. The patient's medical and social history 2. Their use of alcohol, tobacco or illicit drugs 3. Their current medications and supplements 4. The patient's functional ability including ADL's, fall risks, home safety risks and hearing or visual             impairment. 5. Diet and physical activities 6. Evidence for depression or mood disorders  The patients weight, height, BMI and visual acuity have been recorded in the chart I have made referrals, counseling and provided education to the patient based review of the above and I have provided the pt with a written personalized care plan for preventive services.  I have provided you with a copy of your personalized plan for preventive services. Please take the time to review along with your updated medication list.  UTD on screening and imms Counseling done

## 2012-10-03 NOTE — Assessment & Plan Note (Signed)
Discussed checking fasting sugars  Will recheck labs on increased meds

## 2012-10-03 NOTE — Assessment & Plan Note (Signed)
Echo benign

## 2012-10-03 NOTE — Progress Notes (Signed)
Subjective:    Patient ID: Shane Grant, male    DOB: 31-Aug-1931, 79 y.o.   MRN: [id number]  HPI Here for Medicare wellness visit and follow up Regular exercise--walking and health center. Some leg aching No falls No depression or anhedonia No tobacco 0-2 alcohol drinks per day Independent with instrumental ADLs Mild vision and hearing problems No cognitive problems  About 1 month ago, got bad dizzy spell in [employer] Better after sitting for 10 minutes Still felt off when got home BP was 90 systolic Cut back on lisinopril to 5mg  daily Still dips down at times Still under 100 and never above 120 systolic Wonders about stopping No further dizzy spells  Checks sugars randomly Usually 150-170  Has nocturia x 3 Flow is weak Mild urge incontinence at times Not really a problem when he goes out--- more careful  Still with right shoulder pain Only hurts if lifting something heavy or reaches back Knee is better  Current Outpatient Prescriptions on File Prior to Visit  Medication Sig Dispense Refill  . aspirin 81 MG tablet Take 81 mg by mouth daily.        Marland Kitchen atorvastatin (LIPITOR) 80 MG tablet Take 1 tablet (80 mg total) by mouth daily.  90 tablet  3  . glipiZIDE (GLUCOTROL) 5 MG tablet Take 1 tablet (5 mg total) by mouth 2 (two) times daily before a meal.  180 tablet  3  . metFORMIN (GLUCOPHAGE) 1000 MG tablet Take 1 tablet (1,000 mg total) by mouth 2 (two) times daily with a meal.  180 tablet  3  . polyethylene glycol (MIRALAX / GLYCOLAX) packet Take 17 g by mouth daily as needed.       No current facility-administered medications on file prior to visit.    Allergies  Allergen Reactions  . Ancef [Cefazolin Sodium] Nausea Only    Past Medical History  Diagnosis Date  . Hypertension ~1997  . Hyperlipidemia   . Diabetes mellitus   . Colon cancer 2003    Partial colectomy for cancer in polyp  . Kidney stones 2000    lithotripsy  . Urge incontinence  of urine     Past Surgical History  Procedure Laterality Date  . Tonsillectomy and adenoidectomy  1947  . Appendectomy  2003    with colectomy  . Partial colectomy  2003    ascending colon  . Hepatic lesion  2011    Cryoablation by radiologist  . Lithotripsy  2000    Family History  Problem Relation Age of Onset  . Heart disease Neg Hx   . Hypertension Neg Hx   . Diabetes Neg Hx   . Cancer Brother     lung    History   Social History  . Marital Status: Married    Spouse Name: N/A    Number of Children: 3  . Years of Education: N/A   Occupational History  . Scientific laboratory technician    Retired   Social History Main Topics  . Smoking status: Former Smoker    Types: Cigars  . Smokeless tobacco: Never Used  . Alcohol Use: Yes     Comment: Glass of wine or scotch before dinner  . Drug Use: No  . Sexual Activity: Not on file   Other Topics Concern  . Not on file   Social History Narrative   3 sons all married with 2 children   Has living will   Wife, then son Arline Asp, is health  care POA   Would accept resuscitation but no prolonged artificial ventilation   Would probably not want tube feedings   Review of Systems Some rectal staining in underwear-- no incontinence. Will have loose stools at times. Goes bid regularly.  Discussed trying fiber---will consider using pad No blood Vision off some---will need cataract surgery soon Sleeps well in general Appetite is good Weight is down 4#     Objective:   Physical Exam  Constitutional: He is oriented to person, place, and time. He appears well-developed and well-nourished. No distress.  HENT:  Mouth/Throat: Oropharynx is clear and moist. No oropharyngeal exudate.  Neck: Normal range of motion. Neck supple. No thyromegaly present.  Cardiovascular: Normal rate, regular rhythm and intact distal pulses.  Exam reveals no gallop.   Murmur heard. Gr 3/6 aortic systolic murmur  Pulmonary/Chest: Effort normal  and breath sounds normal. No respiratory distress. He has no wheezes. He has no rales.  Abdominal: Soft. There is no tenderness.  Musculoskeletal: He exhibits no edema and no tenderness.  Lymphadenopathy:    He has no cervical adenopathy.  Neurological: He is alert and oriented to person, place, and time.  President-- "Obama, Bush, Clinton" [phone] [id number] Recall 3/3  Decreased sensation in feet  Skin:  Thickened toenails  No ulcers   Psychiatric: He has a normal mood and affect. His behavior is normal.          Assessment & Plan:

## 2012-10-03 NOTE — Assessment & Plan Note (Signed)
Mild symptoms No meds 

## 2012-10-03 NOTE — Assessment & Plan Note (Signed)
BP Readings from Last 3 Encounters:  10/03/12 112/68  04/04/12 128/70  10/03/11 110/70   Orthostatic spell so on decreased lisinopril No change now

## 2012-11-05 DIAGNOSIS — H2510 Age-related nuclear cataract, unspecified eye: Secondary | ICD-10-CM | POA: Diagnosis not present

## 2012-11-13 ENCOUNTER — Ambulatory Visit (INDEPENDENT_AMBULATORY_CARE_PROVIDER_SITE_OTHER): Payer: Medicare Other

## 2012-11-13 DIAGNOSIS — Z23 Encounter for immunization: Secondary | ICD-10-CM

## 2012-11-22 ENCOUNTER — Encounter: Payer: Self-pay | Admitting: Family Medicine

## 2012-11-22 ENCOUNTER — Ambulatory Visit (INDEPENDENT_AMBULATORY_CARE_PROVIDER_SITE_OTHER): Payer: Medicare Other | Admitting: Family Medicine

## 2012-11-22 VITALS — BP 96/62 | HR 89 | Temp 98.7°F | Ht 78.0 in | Wt 266.0 lb

## 2012-11-22 DIAGNOSIS — J029 Acute pharyngitis, unspecified: Secondary | ICD-10-CM | POA: Diagnosis not present

## 2012-11-22 DIAGNOSIS — J069 Acute upper respiratory infection, unspecified: Secondary | ICD-10-CM | POA: Diagnosis not present

## 2012-11-22 LAB — POCT RAPID STREP A (OFFICE): Rapid Strep A Screen: NEGATIVE

## 2012-11-22 NOTE — Progress Notes (Signed)
  Subjective:    Patient ID: Shane Grant, male    DOB: 06/02/1931, 79 y.o.   MRN: [id number]  Cough This is a new problem. The current episode started yesterday. The problem has been gradually worsening. The problem occurs every few minutes. The cough is productive of sputum. Associated symptoms include a sore throat. Pertinent negatives include no chills, ear congestion, ear pain, fever, headaches, nasal congestion, postnasal drip, rash, rhinorrhea, shortness of breath or wheezing. Associated symptoms comments: Severe ST  no sinus pressure or pain. Nothing aggravates the symptoms. Risk factors: former smoker.. cigars remotely. Treatments tried: aspirin. The treatment provided mild relief. There is no history of asthma, bronchiectasis, bronchitis, COPD, emphysema, environmental allergies or pneumonia.    No sick exposures.  Review of Systems  Constitutional: Negative for fever and chills.  HENT: Positive for sore throat. Negative for ear pain, postnasal drip and rhinorrhea.   Respiratory: Positive for cough. Negative for shortness of breath and wheezing.   Skin: Negative for rash.  Allergic/Immunologic: Negative for environmental allergies.  Neurological: Negative for headaches.       Objective:   Physical Exam        Assessment & Plan:

## 2012-11-22 NOTE — Assessment & Plan Note (Signed)
No sign of bacterial infection.. Likely viral pharyngitis/URI. Call if not improving.

## 2012-11-22 NOTE — Patient Instructions (Signed)
Likely viral pharyngitis. Symptomatic care such as ibuprofen 800 mg every eight hours for pain. Can use mucinex DM for cough... expect 7-10 days of illness.

## 2012-11-23 ENCOUNTER — Telehealth: Payer: Self-pay | Admitting: Internal Medicine

## 2012-11-23 ENCOUNTER — Encounter: Payer: Self-pay | Admitting: Family Medicine

## 2012-11-23 MED ORDER — MAGIC MOUTHWASH W/LIDOCAINE: 5.0000 mL | Freq: Four times a day (QID) | ORAL | Status: DC | PRN

## 2012-11-23 MED ORDER — AMOXICILLIN 500 MG PO CAPS: 1000.0000 mg | ORAL_CAPSULE | Freq: Two times a day (BID) | ORAL | Status: DC

## 2012-11-23 NOTE — Telephone Encounter (Signed)
Please make sure pt recieves mychart note by end of day.

## 2012-11-23 NOTE — Telephone Encounter (Signed)
Patient Information:  Caller Name: Tyler Aas  Phone: [phone]  Patient: Mani, Celestin  Gender: Male  DOB: Jun 11, 1931  Age: 79 Years  PCP: Tillman Abide [facility])  Office Follow Up:  Does the office need to follow up with this patient?: Yes  Instructions For The Office: Pls see RN note  RN Note:  Follow up regarding Sore throat w/ Cough.  Pt was seen on 10-10, negative rapid, no results seen for Culture, Pt is not getting any better, wife states throat pain is not improving after Motrin 800mg  q8 hrs since eval.  Per [facility], Dr Ermalene Searing advised Pt to call back if sxs worsen. No same day appts.  Discussed warm salt water gargling and herbal tea w/ honey until Pt hears back from office.  Please review w/ Dr Ermalene Searing and f/u w/ Wife.  Symptoms  Reason For Call & Symptoms: Sore Throat, Cough, green sputum.  Reviewed Health History In EMR: Yes  Reviewed Medications In EMR: Yes  Reviewed Allergies In EMR: Yes  Reviewed Surgeries / Procedures: Yes  Date of Onset of Symptoms: 11/22/2012  Treatments Tried: Motrin 800mg  q8 hrs, Mucinex DM  Treatments Tried Worked: No  Guideline(s) Used:  Sore Throat  Disposition Per Guideline:   See Today in Office  Reason For Disposition Reached:   Severe sore throat pain  Advice Given:  N/A  Patient Will Follow Care Advice:  YES

## 2012-11-24 NOTE — Telephone Encounter (Signed)
This should really not be sent to me while I am on vacation. Can you please have someone check on his status on Monday?

## 2012-11-26 ENCOUNTER — Encounter: Payer: Self-pay | Admitting: Family Medicine

## 2012-11-26 NOTE — Telephone Encounter (Signed)
Spoke with pt's wife, Tyler Aas.  Patient is much better and said he does not need to recheck.

## 2012-12-19 ENCOUNTER — Ambulatory Visit: Payer: Self-pay | Admitting: Ophthalmology

## 2012-12-19 DIAGNOSIS — I10 Essential (primary) hypertension: Secondary | ICD-10-CM | POA: Diagnosis not present

## 2012-12-19 DIAGNOSIS — H25049 Posterior subcapsular polar age-related cataract, unspecified eye: Secondary | ICD-10-CM | POA: Diagnosis not present

## 2012-12-19 DIAGNOSIS — Z01810 Encounter for preprocedural cardiovascular examination: Secondary | ICD-10-CM | POA: Diagnosis not present

## 2012-12-19 DIAGNOSIS — I119 Hypertensive heart disease without heart failure: Secondary | ICD-10-CM | POA: Diagnosis not present

## 2013-01-01 ENCOUNTER — Ambulatory Visit: Payer: Self-pay | Admitting: Ophthalmology

## 2013-01-01 DIAGNOSIS — Z888 Allergy status to other drugs, medicaments and biological substances status: Secondary | ICD-10-CM | POA: Diagnosis not present

## 2013-01-01 DIAGNOSIS — H9190 Unspecified hearing loss, unspecified ear: Secondary | ICD-10-CM | POA: Diagnosis not present

## 2013-01-01 DIAGNOSIS — Z7982 Long term (current) use of aspirin: Secondary | ICD-10-CM | POA: Diagnosis not present

## 2013-01-01 DIAGNOSIS — H269 Unspecified cataract: Secondary | ICD-10-CM | POA: Diagnosis not present

## 2013-01-01 DIAGNOSIS — Z85038 Personal history of other malignant neoplasm of large intestine: Secondary | ICD-10-CM | POA: Diagnosis not present

## 2013-01-01 DIAGNOSIS — H25049 Posterior subcapsular polar age-related cataract, unspecified eye: Secondary | ICD-10-CM | POA: Diagnosis not present

## 2013-01-01 DIAGNOSIS — Z79899 Other long term (current) drug therapy: Secondary | ICD-10-CM | POA: Diagnosis not present

## 2013-01-01 DIAGNOSIS — E119 Type 2 diabetes mellitus without complications: Secondary | ICD-10-CM | POA: Diagnosis not present

## 2013-01-01 DIAGNOSIS — I10 Essential (primary) hypertension: Secondary | ICD-10-CM | POA: Diagnosis not present

## 2013-01-09 DIAGNOSIS — H2510 Age-related nuclear cataract, unspecified eye: Secondary | ICD-10-CM | POA: Diagnosis not present

## 2013-01-22 ENCOUNTER — Ambulatory Visit: Payer: Self-pay | Admitting: Ophthalmology

## 2013-01-22 DIAGNOSIS — Z7982 Long term (current) use of aspirin: Secondary | ICD-10-CM | POA: Diagnosis not present

## 2013-01-22 DIAGNOSIS — Z79899 Other long term (current) drug therapy: Secondary | ICD-10-CM | POA: Diagnosis not present

## 2013-01-22 DIAGNOSIS — H9190 Unspecified hearing loss, unspecified ear: Secondary | ICD-10-CM | POA: Diagnosis not present

## 2013-01-22 DIAGNOSIS — E7800 Pure hypercholesterolemia, unspecified: Secondary | ICD-10-CM | POA: Diagnosis not present

## 2013-01-22 DIAGNOSIS — H269 Unspecified cataract: Secondary | ICD-10-CM | POA: Diagnosis not present

## 2013-01-22 DIAGNOSIS — E119 Type 2 diabetes mellitus without complications: Secondary | ICD-10-CM | POA: Diagnosis not present

## 2013-01-22 DIAGNOSIS — I059 Rheumatic mitral valve disease, unspecified: Secondary | ICD-10-CM | POA: Diagnosis not present

## 2013-01-22 DIAGNOSIS — H2510 Age-related nuclear cataract, unspecified eye: Secondary | ICD-10-CM | POA: Diagnosis not present

## 2013-01-22 DIAGNOSIS — I10 Essential (primary) hypertension: Secondary | ICD-10-CM | POA: Diagnosis not present

## 2013-01-22 DIAGNOSIS — Z888 Allergy status to other drugs, medicaments and biological substances status: Secondary | ICD-10-CM | POA: Diagnosis not present

## 2013-01-22 DIAGNOSIS — Z85038 Personal history of other malignant neoplasm of large intestine: Secondary | ICD-10-CM | POA: Diagnosis not present

## 2013-04-03 ENCOUNTER — Other Ambulatory Visit: Payer: Self-pay | Admitting: Internal Medicine

## 2013-04-09 ENCOUNTER — Ambulatory Visit: Payer: Medicare Other | Admitting: Internal Medicine

## 2013-04-12 ENCOUNTER — Ambulatory Visit: Payer: Medicare Other | Admitting: Internal Medicine

## 2013-04-17 ENCOUNTER — Telehealth: Payer: Self-pay | Admitting: Internal Medicine

## 2013-04-17 ENCOUNTER — Encounter: Payer: Self-pay | Admitting: Internal Medicine

## 2013-04-17 ENCOUNTER — Ambulatory Visit (INDEPENDENT_AMBULATORY_CARE_PROVIDER_SITE_OTHER): Payer: Medicare Other | Admitting: Internal Medicine

## 2013-04-17 VITALS — BP 130/68 | HR 88 | Temp 97.7°F | Wt 262.0 lb

## 2013-04-17 DIAGNOSIS — E1142 Type 2 diabetes mellitus with diabetic polyneuropathy: Secondary | ICD-10-CM

## 2013-04-17 DIAGNOSIS — E1149 Type 2 diabetes mellitus with other diabetic neurological complication: Secondary | ICD-10-CM

## 2013-04-17 DIAGNOSIS — N3941 Urge incontinence: Secondary | ICD-10-CM

## 2013-04-17 DIAGNOSIS — I10 Essential (primary) hypertension: Secondary | ICD-10-CM

## 2013-04-17 DIAGNOSIS — I739 Peripheral vascular disease, unspecified: Secondary | ICD-10-CM | POA: Insufficient documentation

## 2013-04-17 LAB — HEMOGLOBIN A1C: Hgb A1c MFr Bld: 9 % — ABNORMAL HIGH (ref 4.6–6.5)

## 2013-04-17 NOTE — Assessment & Plan Note (Signed)
Using protective garments

## 2013-04-17 NOTE — Assessment & Plan Note (Signed)
Right more than left Faint distal pulses Not likely spinal stenosis Will start with ABI

## 2013-04-17 NOTE — Assessment & Plan Note (Signed)
Hopefully still acceptable control High fastings may be acceptable if okay overall (next step lantus)

## 2013-04-17 NOTE — Telephone Encounter (Signed)
Relevant patient education assigned to patient using Emmi. ° °

## 2013-04-17 NOTE — Progress Notes (Signed)
Subjective:    Patient ID: Shane Grant, male    DOB: 24-Oct-1931, 80 y.o.   MRN: [id number]  HPI Doing well  Checks sugars about once a week--fasting Usually 150-160 No hypoglycemic reactions Exercises 3 days per week but has trouble walking--- gets pain in legs after about 1/4 mile  No chest pain No SOB Did have dizzy spell 3 mornings ago after standing up. BP 74/48 No edema  Still has urinary frequency Has incontinence Is starting to wear protective garment  Current Outpatient Prescriptions on File Prior to Visit  Medication Sig Dispense Refill  . aspirin 81 MG tablet Take 81 mg by mouth daily.        Marland Kitchen atorvastatin (LIPITOR) 80 MG tablet Take 1 tablet (80 mg total) by mouth daily.  90 tablet  3  . glipiZIDE (GLUCOTROL) 5 MG tablet TAKE 1 TABLET (5 MG TOTAL) BY MOUTH 2 (TWO) TIMES DAILY BEFORE A MEAL.  180 tablet  3  . lisinopril (PRINIVIL,ZESTRIL) 10 MG tablet Take 5 mg by mouth daily.      . metFORMIN (GLUCOPHAGE) 1000 MG tablet Take 1 tablet (1,000 mg total) by mouth 2 (two) times daily with a meal.  180 tablet  3  . polyethylene glycol (MIRALAX / GLYCOLAX) packet Take 17 g by mouth daily as needed.       No current facility-administered medications on file prior to visit.    Allergies  Allergen Reactions  . Ancef [Cefazolin Sodium] Nausea Only    Past Medical History  Diagnosis Date  . Hypertension ~1997  . Hyperlipidemia   . Diabetes mellitus   . Colon cancer 2003    Partial colectomy for cancer in polyp  . Kidney stones 2000    lithotripsy  . Urge incontinence of urine     Past Surgical History  Procedure Laterality Date  . Tonsillectomy and adenoidectomy  1947  . Appendectomy  2003    with colectomy  . Partial colectomy  2003    ascending colon  . Hepatic lesion  2011    Cryoablation by radiologist  . Lithotripsy  2000    Family History  Problem Relation Age of Onset  . Heart disease Neg Hx   . Hypertension Neg Hx   . Diabetes  Neg Hx   . Cancer Brother     lung    History   Social History  . Marital Status: Married    Spouse Name: N/A    Number of Children: 3  . Years of Education: N/A   Occupational History  . Land    Retired   Social History Main Topics  . Smoking status: Former Smoker    Types: Cigars  . Smokeless tobacco: Never Used  . Alcohol Use: Yes     Comment: Glass of wine or scotch before dinner  . Drug Use: No  . Sexual Activity: Not on file   Other Topics Concern  . Not on file   Social History Narrative   3 sons all married with 2 children   Has living will   Wife, then son Arva Chafe, is health care POA   Would accept resuscitation but no prolonged artificial ventilation   Would probably not want tube feedings    Review of Systems Weight is down 4# Sleeps well Bowels are okay No back pain or leg weakness    Objective:   Physical Exam  Constitutional: He appears well-developed and well-nourished. No distress.  Neck: Normal  range of motion. Neck supple. No thyromegaly present.  Cardiovascular: Normal rate and regular rhythm.  Exam reveals no gallop.   Murmur heard. Faint pedal pulses Good femoral pulses without bruit Soft systolic murmur over mitral area  Pulmonary/Chest: Effort normal and breath sounds normal. No respiratory distress. He has no wheezes. He has no rales.  Abdominal: Soft. There is no tenderness.  No palpable AAA  Musculoskeletal: He exhibits no edema and no tenderness.  Lymphadenopathy:    He has no cervical adenopathy.  Skin:  No foot lesions  Psychiatric: He has a normal mood and affect. His behavior is normal.          Assessment & Plan:

## 2013-04-17 NOTE — Assessment & Plan Note (Signed)
BP Readings from Last 3 Encounters:  04/17/13 130/68  11/22/12 96/62  10/03/12 112/68   Good control Orthostatic dizziness if dry---discussed

## 2013-04-17 NOTE — Progress Notes (Signed)
Pre visit review using our clinic review tool, if applicable. No additional management support is needed unless otherwise documented below in the visit note. 

## 2013-04-17 NOTE — Assessment & Plan Note (Signed)
Mild sensory change but no pain

## 2013-04-18 ENCOUNTER — Other Ambulatory Visit: Payer: Self-pay | Admitting: Internal Medicine

## 2013-04-19 ENCOUNTER — Telehealth: Payer: Self-pay

## 2013-04-19 NOTE — Telephone Encounter (Signed)
Relevant patient education assigned to patient using Emmi. ° °

## 2013-04-25 ENCOUNTER — Encounter (INDEPENDENT_AMBULATORY_CARE_PROVIDER_SITE_OTHER): Payer: Medicare Other

## 2013-04-25 DIAGNOSIS — I739 Peripheral vascular disease, unspecified: Secondary | ICD-10-CM | POA: Diagnosis not present

## 2013-04-27 ENCOUNTER — Other Ambulatory Visit: Payer: Self-pay | Admitting: Internal Medicine

## 2013-05-07 ENCOUNTER — Other Ambulatory Visit: Payer: Self-pay | Admitting: Internal Medicine

## 2013-07-26 ENCOUNTER — Other Ambulatory Visit: Payer: Self-pay | Admitting: Internal Medicine

## 2013-07-26 DIAGNOSIS — E1149 Type 2 diabetes mellitus with other diabetic neurological complication: Secondary | ICD-10-CM

## 2013-07-30 ENCOUNTER — Other Ambulatory Visit (INDEPENDENT_AMBULATORY_CARE_PROVIDER_SITE_OTHER): Payer: Medicare Other

## 2013-07-30 DIAGNOSIS — E1149 Type 2 diabetes mellitus with other diabetic neurological complication: Secondary | ICD-10-CM | POA: Diagnosis not present

## 2013-07-30 LAB — HEMOGLOBIN A1C: Hgb A1c MFr Bld: 8.7 % — ABNORMAL HIGH (ref 4.6–6.5)

## 2013-08-09 ENCOUNTER — Encounter: Payer: Self-pay | Admitting: Internal Medicine

## 2013-08-09 ENCOUNTER — Ambulatory Visit (INDEPENDENT_AMBULATORY_CARE_PROVIDER_SITE_OTHER): Payer: Medicare Other | Admitting: Internal Medicine

## 2013-08-09 VITALS — BP 110/70 | HR 85 | Wt 259.0 lb

## 2013-08-09 DIAGNOSIS — E1149 Type 2 diabetes mellitus with other diabetic neurological complication: Secondary | ICD-10-CM | POA: Diagnosis not present

## 2013-08-09 MED ORDER — CANAGLIFLOZIN 100 MG PO TABS: 1.0000 | ORAL_TABLET | Freq: Every day | ORAL | Status: DC

## 2013-08-09 NOTE — Progress Notes (Signed)
Pre visit review using our clinic review tool, if applicable. No additional management support is needed unless otherwise documented below in the visit note. 

## 2013-08-09 NOTE — Progress Notes (Signed)
   Subjective:    Patient ID: Shane Grant, male    DOB: 03/28/1931, 80 y.o.   MRN: [id number]  HPI Here to discuss the elevated sugar He is surprised due to better sugars lately (120-130 fasting) and some weight loss Has tried to be compliant with diet Limited exercise tolerance---tires after walking a while  No hypoglycemic reactions  Current Outpatient Prescriptions on File Prior to Visit  Medication Sig Dispense Refill  . aspirin 81 MG tablet Take 81 mg by mouth daily.        Marland Kitchen atorvastatin (LIPITOR) 80 MG tablet TAKE 1 TABLET (80 MG TOTAL) BY MOUTH DAILY.  90 tablet  1  . glipiZIDE (GLUCOTROL) 5 MG tablet TAKE 1 TABLET (5 MG TOTAL) BY MOUTH 2 (TWO) TIMES DAILY BEFORE A MEAL.  180 tablet  3  . lisinopril (PRINIVIL,ZESTRIL) 10 MG tablet TAKE 1 TABLET (10 MG TOTAL) BY MOUTH DAILY.  90 tablet  2  . metFORMIN (GLUCOPHAGE) 1000 MG tablet TAKE 1 TABLET (1,000 MG TOTAL) BY MOUTH 2 (TWO) TIMES DAILY WITH A MEAL.  180 tablet  3  . polyethylene glycol (MIRALAX / GLYCOLAX) packet Take 17 g by mouth daily as needed.       No current facility-administered medications on file prior to visit.    Allergies  Allergen Reactions  . Ancef [Cefazolin Sodium] Nausea Only    Past Medical History  Diagnosis Date  . Hypertension ~1997  . Hyperlipidemia   . Diabetes mellitus   . Colon cancer 2003    Partial colectomy for cancer in polyp  . Kidney stones 2000    lithotripsy  . Urge incontinence of urine     Past Surgical History  Procedure Laterality Date  . Tonsillectomy and adenoidectomy  1947  . Appendectomy  2003    with colectomy  . Partial colectomy  2003    ascending colon  . Hepatic lesion  2011    Cryoablation by radiologist  . Lithotripsy  2000    Family History  Problem Relation Age of Onset  . Heart disease Neg Hx   . Hypertension Neg Hx   . Diabetes Neg Hx   . Cancer Brother     lung    History   Social History  . Marital Status: Married    Spouse  Name: N/A    Number of Children: 3  . Years of Education: N/A   Occupational History  . Land    Retired   Social History Main Topics  . Smoking status: Former Smoker    Types: Cigars  . Smokeless tobacco: Never Used  . Alcohol Use: Yes     Comment: Glass of wine or scotch before dinner  . Drug Use: No  . Sexual Activity: Not on file   Other Topics Concern  . Not on file   Social History Narrative   3 sons all married with 2 children   Has living will   Wife, then son Arva Chafe, is health care POA   Would accept resuscitation but no prolonged artificial ventilation   Would probably not want tube feedings   Review of Systems     Objective:   Physical Exam        Assessment & Plan:

## 2013-08-09 NOTE — Assessment & Plan Note (Signed)
Discussed options He is willing to try another med Will try low dose invokana Discussed possible side effects--including cost, urinary symptoms, etc  Counseled all of ~15 minute visit

## 2013-08-19 DIAGNOSIS — IMO0001 Reserved for inherently not codable concepts without codable children: Secondary | ICD-10-CM | POA: Diagnosis not present

## 2013-08-19 LAB — HM DIABETES EYE EXAM

## 2013-09-06 ENCOUNTER — Other Ambulatory Visit: Payer: Self-pay | Admitting: Internal Medicine

## 2013-09-06 ENCOUNTER — Encounter: Payer: Self-pay | Admitting: Internal Medicine

## 2013-10-31 ENCOUNTER — Other Ambulatory Visit: Payer: Self-pay | Admitting: Internal Medicine

## 2013-11-20 ENCOUNTER — Ambulatory Visit (INDEPENDENT_AMBULATORY_CARE_PROVIDER_SITE_OTHER): Payer: Medicare Other

## 2013-11-20 DIAGNOSIS — Z23 Encounter for immunization: Secondary | ICD-10-CM | POA: Diagnosis not present

## 2013-11-27 ENCOUNTER — Ambulatory Visit (INDEPENDENT_AMBULATORY_CARE_PROVIDER_SITE_OTHER): Payer: Medicare Other | Admitting: Family Medicine

## 2013-11-27 ENCOUNTER — Encounter: Payer: Self-pay | Admitting: Family Medicine

## 2013-11-27 ENCOUNTER — Other Ambulatory Visit: Payer: Self-pay | Admitting: Family Medicine

## 2013-11-27 ENCOUNTER — Ambulatory Visit (INDEPENDENT_AMBULATORY_CARE_PROVIDER_SITE_OTHER)
Admission: RE | Admit: 2013-11-27 | Discharge: 2013-11-27 | Disposition: A | Discharge status: Home / Self Care | Payer: Medicare Other | Source: Ambulatory Visit | Attending: Family Medicine | Admitting: Family Medicine

## 2013-11-27 VITALS — BP 102/58 | HR 94 | Temp 98.4°F | Ht 78.0 in | Wt 257.2 lb

## 2013-11-27 DIAGNOSIS — M25561 Pain in right knee: Secondary | ICD-10-CM | POA: Diagnosis not present

## 2013-11-27 DIAGNOSIS — M1711 Unilateral primary osteoarthritis, right knee: Secondary | ICD-10-CM | POA: Diagnosis not present

## 2013-11-27 DIAGNOSIS — S8991XA Unspecified injury of right lower leg, initial encounter: Secondary | ICD-10-CM | POA: Diagnosis not present

## 2013-11-27 NOTE — Progress Notes (Signed)
Pre visit review using our clinic review tool, if applicable. No additional management support is needed unless otherwise documented below in the visit note. 

## 2013-11-27 NOTE — Progress Notes (Signed)
Dr. Frederico Hamman T. Arvon Schreiner, MD, [facility] [place], [address] Phone: [phone] Fax: [phone]  11/27/2013  Patient: Shane Grant, MRN: [id number], DOB: 1931/05/25, 80 y.o.  Primary Physician:  Viviana Simpler, MD  Chief Complaint: Knee Pain and Fall  Subjective:   Shane Grant is a 80 y.o. very pleasant male patient who presents with the following:  Fall and knee pain: The patient fell over the weekend, and he traumatized his right knee. He struck the anterior aspect, and he thinks that he also struck the medial aspect. There is one abrasion. There is no significant bruising. He is having some great deal difficulty walking. He is not using either a cane or a walker right now. They do not have one at home. Prior to this he did have some knee pain, but it was only intermittent. I saw him several years ago, and at that point we injected his knee, and he got over the problem that he was having. Now he has an effusion, and he is also having difficulty extending as well as flexing his knee.   Past Medical History, Surgical History, Social History, Family History, Problem List, Medications, and Allergies have been reviewed and updated if relevant.  GEN: No fevers, chills. Nontoxic. Primarily MSK c/o today. MSK: Detailed in the HPI GI: tolerating PO intake without difficulty Neuro: No numbness, parasthesias, or tingling associated. Otherwise the pertinent positives of the ROS are noted above.   Objective:   BP 102/58  Pulse 94  Temp(Src) 98.4 F (36.9 C) (Oral)  Ht 6\' 6"  (1.981 m)  Wt 257 lb 4 oz (116.688 kg)  BMI 29.73 kg/m2  SpO2 95%   GEN: WDWN, NAD, Non-toxic, Alert & Oriented x 3 HEENT: Atraumatic, Normocephalic.  Ears and Nose: No external deformity. EXTR: No clubbing/cyanosis/edema PSYCH: Normally interactive. Conversant. Not depressed or anxious appearing.  Calm demeanor.   RIGHT knee: The patient  lacks 10 of ext. Flexion to 90. There is no significant tenderness  Proximal tibia. There is also no tenderness at the patella or the fibula. LCL and MCL appear intact. Moderate effusion. All other exam limited.  Radiology: Dg Knee Ap/lat W/sunrise Right  11/27/2013   CLINICAL DATA:  Right knee pain after trauma.  EXAM: DG KNEE - 3 VIEWS  COMPARISON:  November 22, 2010.  FINDINGS: There is no evidence of fracture or dislocation. Minimal suprapatellar joint effusion is noted. Severe narrowing of the medial and lateral joint spaces is noted, as well as moderate narrowing of the patellofemoral space. Osteophyte formation is noted in multiple areas. Soft tissues are unremarkable.  IMPRESSION: Severe degenerative joint disease. No acute abnormality seen in the right knee.   Electronically Signed   By: Sabino Dick M.D.   On: 11/27/2013 12:01    Assessment and Plan:   Right knee pain - Plan: CANCELED: DG Knee Complete 4 Views Right  Primary osteoarthritis of right knee  MDM Number of Diagnoses or Management Options Primary osteoarthritis of right knee:  Right knee pain:    >25 minutes spent in face to face time with patient, >50% spent in counselling or coordination of care: management, anatomy  Trauma without occult fracture. Certainly will have bone bruising, at least meniscal contusion. Walker for 10-14 days, transition to cane when able. 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   Close f/u  Follow-up: Return in about 1 month (around 12/28/2013).  Signed,  Frederico Hamman  Celedonio Savage, MD   Patient's Medications  New Prescriptions   No medications on file  Previous Medications   ASPIRIN 81 MG TABLET    Take 81 mg by mouth daily.     ATORVASTATIN (LIPITOR) 80 MG TABLET    TAKE 1 TABLET (80 MG TOTAL) BY MOUTH DAILY.   GLIPIZIDE (GLUCOTROL) 5 MG TABLET    TAKE 1 TABLET (5 MG TOTAL) BY MOUTH 2 (TWO) TIMES DAILY BEFORE A MEAL.   LISINOPRIL (PRINIVIL,ZESTRIL) 10  MG TABLET    TAKE 1 TABLET (10 MG TOTAL) BY MOUTH DAILY.   METFORMIN (GLUCOPHAGE) 1000 MG TABLET    TAKE 1 TABLET (1,000 MG TOTAL) BY MOUTH 2 (TWO) TIMES DAILY WITH A MEAL.   POLYETHYLENE GLYCOL (MIRALAX / GLYCOLAX) PACKET    Take 17 g by mouth daily as needed.  Modified Medications   No medications on file  Discontinued Medications   No medications on file

## 2013-12-04 ENCOUNTER — Encounter: Payer: Self-pay | Admitting: Internal Medicine

## 2013-12-04 ENCOUNTER — Ambulatory Visit (INDEPENDENT_AMBULATORY_CARE_PROVIDER_SITE_OTHER): Payer: Medicare Other | Admitting: Internal Medicine

## 2013-12-04 VITALS — BP 108/58 | HR 74 | Temp 97.6°F | Ht 78.0 in | Wt 259.0 lb

## 2013-12-04 DIAGNOSIS — E1159 Type 2 diabetes mellitus with other circulatory complications: Secondary | ICD-10-CM | POA: Diagnosis not present

## 2013-12-04 DIAGNOSIS — C189 Malignant neoplasm of colon, unspecified: Secondary | ICD-10-CM | POA: Diagnosis not present

## 2013-12-04 DIAGNOSIS — I10 Essential (primary) hypertension: Secondary | ICD-10-CM

## 2013-12-04 DIAGNOSIS — E1165 Type 2 diabetes mellitus with hyperglycemia: Secondary | ICD-10-CM | POA: Diagnosis not present

## 2013-12-04 DIAGNOSIS — E1142 Type 2 diabetes mellitus with diabetic polyneuropathy: Secondary | ICD-10-CM

## 2013-12-04 DIAGNOSIS — I739 Peripheral vascular disease, unspecified: Secondary | ICD-10-CM

## 2013-12-04 DIAGNOSIS — Z7189 Other specified counseling: Secondary | ICD-10-CM | POA: Insufficient documentation

## 2013-12-04 DIAGNOSIS — IMO0002 Reserved for concepts with insufficient information to code with codable children: Secondary | ICD-10-CM

## 2013-12-04 DIAGNOSIS — E785 Hyperlipidemia, unspecified: Secondary | ICD-10-CM | POA: Diagnosis not present

## 2013-12-04 DIAGNOSIS — E1140 Type 2 diabetes mellitus with diabetic neuropathy, unspecified: Secondary | ICD-10-CM | POA: Diagnosis not present

## 2013-12-04 DIAGNOSIS — E1151 Type 2 diabetes mellitus with diabetic peripheral angiopathy without gangrene: Secondary | ICD-10-CM | POA: Insufficient documentation

## 2013-12-04 DIAGNOSIS — Z0000 Encounter for general adult medical examination without abnormal findings: Secondary | ICD-10-CM | POA: Diagnosis not present

## 2013-12-04 LAB — CBC WITH DIFFERENTIAL/PLATELET
BASOS PCT: 0.8 % (ref 0.0–3.0)
Basophils Absolute: 0.1 10*3/uL (ref 0.0–0.1)
Eosinophils Absolute: 0.3 10*3/uL (ref 0.0–0.7)
Eosinophils Relative: 2.5 % (ref 0.0–5.0)
HEMATOCRIT: 39.7 % (ref 39.0–52.0)
HEMOGLOBIN: 12.9 g/dL — AB (ref 13.0–17.0)
LYMPHS ABS: 1.4 10*3/uL (ref 0.7–4.0)
Lymphocytes Relative: 10.7 % — ABNORMAL LOW (ref 12.0–46.0)
MCHC: 32.5 g/dL (ref 30.0–36.0)
MCV: 92.4 fl (ref 78.0–100.0)
MONOS PCT: 4.8 % (ref 3.0–12.0)
Monocytes Absolute: 0.6 10*3/uL (ref 0.1–1.0)
NEUTROS ABS: 10.9 10*3/uL — AB (ref 1.4–7.7)
Neutrophils Relative %: 81.2 % — ABNORMAL HIGH (ref 43.0–77.0)
Platelets: 321 10*3/uL (ref 150.0–400.0)
RBC: 4.29 Mil/uL (ref 4.22–5.81)
RDW: 13.2 % (ref 11.5–15.5)
WBC: 13.5 10*3/uL — ABNORMAL HIGH (ref 4.0–10.5)

## 2013-12-04 LAB — HM DIABETES FOOT EXAM

## 2013-12-04 LAB — HEMOGLOBIN A1C: Hgb A1c MFr Bld: 9.3 % — ABNORMAL HIGH (ref 4.6–6.5)

## 2013-12-04 NOTE — Assessment & Plan Note (Signed)
BP Readings from Last 3 Encounters:  12/04/13 108/58  11/27/13 102/58  08/09/13 110/70   Good control On ACEI

## 2013-12-04 NOTE — Progress Notes (Signed)
Subjective:    Patient ID: Shane Grant, male    DOB: [birth year], 80 y.o.   MRN: [id number]  HPI Here for Medicare wellness and follow up Reviewed form and advanced directives Reviewed other physicians Did have one recent fall--bruised knee (but now better) 1 drink daily usually. No tobacco. Tries to exercise regularly Vision is okay No cognitive problems Independent with all instrumental ADLs  Checks his sugars occasionally-- "all over the place" Not sure about the variability No hypoglycemic episodes At most mild foot numbness-- no pain  Ongoing pain in legs if he pushes it Has to limit walking--- only 1/4-1/2 mile and then needs to rest briefly No chest pain No SOB Rare dizzy sensation upon first standing--no recently. No syncope No edema  No problems with statin No myalgias No GI problems  Current Outpatient Prescriptions on File Prior to Visit  Medication Sig Dispense Refill  . aspirin 81 MG tablet Take 81 mg by mouth daily.        Marland Kitchen atorvastatin (LIPITOR) 80 MG tablet TAKE 1 TABLET (80 MG TOTAL) BY MOUTH DAILY.  90 tablet  1  . glipiZIDE (GLUCOTROL) 5 MG tablet TAKE 1 TABLET (5 MG TOTAL) BY MOUTH 2 (TWO) TIMES DAILY BEFORE A MEAL.  180 tablet  3  . metFORMIN (GLUCOPHAGE) 1000 MG tablet TAKE 1 TABLET (1,000 MG TOTAL) BY MOUTH 2 (TWO) TIMES DAILY WITH A MEAL.  180 tablet  3  . polyethylene glycol (MIRALAX / GLYCOLAX) packet Take 17 g by mouth daily as needed.       No current facility-administered medications on file prior to visit.    Allergies  Allergen Reactions  . Invokana [Canagliflozin]     Raised sugars and he felt bad  . Ancef [Cefazolin Sodium] Nausea Only    Past Medical History  Diagnosis Date  . Hypertension ~1997  . Hyperlipidemia   . Diabetes mellitus   . Colon cancer 2003    Partial colectomy for cancer in polyp  . Kidney stones 2000    lithotripsy  . Urge incontinence of urine     Past Surgical History  Procedure Laterality Date    . Tonsillectomy and adenoidectomy  1947  . Appendectomy  2003    with colectomy  . Partial colectomy  2003    ascending colon  . Hepatic lesion  2011    Cryoablation by radiologist  . Lithotripsy  2000  . Cataract extraction w/ intraocular lens  implant, bilateral Bilateral 11/14, 12/14    Dr Shane Grant    Family History  Problem Relation Age of Onset  . Heart disease Neg Hx   . Hypertension Neg Hx   . Diabetes Neg Hx   . Cancer Brother     lung    History   Social History  . Marital Status: Married    Spouse Name: N/A    Number of Children: 3  . Years of Education: N/A   Occupational History  . Land    Retired   Social History Main Topics  . Smoking status: Former Smoker    Types: Cigars  . Smokeless tobacco: Never Used  . Alcohol Use: Yes     Comment: Glass of wine or scotch before dinner  . Drug Use: No  . Sexual Activity: Not on file   Other Topics Concern  . Not on file   Social History Narrative   3 sons all married with 2 children   Has living will  Wife, then son Shane Grant, is health care POA   Would accept resuscitation but no prolonged artificial ventilation   Would probably not want tube feedings   Review of Systems Bowels are fine. Has had 2 colonoscopies after cancerous polyps--he is comfortable not doing any more. Sleeps fine Appetite is okay--has cut down on eating Weight is down 20-30# over the past few years Regular with dentist--teeth are okay. No sig arthritis problems    Objective:   Physical Exam  Constitutional: He is oriented to person, place, and time. He appears well-developed and well-nourished. No distress.  Neck: Normal range of motion. Neck supple. No thyromegaly present.  Cardiovascular: Normal rate, regular rhythm, normal heart sounds and intact distal pulses.  Exam reveals no gallop.   No murmur heard. Pulmonary/Chest: Effort normal and breath sounds normal. No respiratory distress. He has no  wheezes. He has no rales.  Abdominal: Soft. There is no tenderness.  Musculoskeletal: He exhibits no edema and no tenderness.  Lymphadenopathy:    He has no cervical adenopathy.  Neurological: He is alert and oriented to person, place, and time.  President-- "Obama, Bush, Clinton" [phone] [id number] Recall 1/3  Absent fine touch sensation in feet  Skin: No rash noted.  No foot lesions Several mycotic toenails  Psychiatric: He has a normal mood and affect. His behavior is normal.          Assessment & Plan:

## 2013-12-04 NOTE — Assessment & Plan Note (Signed)
Stable status No intervention indicated (except pushing through pain as much as possible)

## 2013-12-04 NOTE — Assessment & Plan Note (Signed)
lantus if a1c up more

## 2013-12-04 NOTE — Progress Notes (Signed)
   Subjective:    Patient ID: Shane Grant, male    DOB: 1931-08-19, 80 y.o.   MRN: [id number]  HPI    Review of Systems     Objective:   Physical Exam  Cardiovascular:  Murmur heard. Soft systolic murmur heard at base and apex          Assessment & Plan:

## 2013-12-04 NOTE — Assessment & Plan Note (Signed)
I have personally reviewed the Medicare Annual Wellness questionnaire and have noted 1. The patient's medical and social history 2. Their use of alcohol, tobacco or illicit drugs 3. Their current medications and supplements 4. The patient's functional ability including ADL's, fall risks, home safety risks and hearing or visual             impairment. 5. Diet and physical activities 6. Evidence for depression or mood disorders  The patients weight, height, BMI and visual acuity have been recorded in the chart I have made referrals, counseling and provided education to the patient based review of the above and I have provided the pt with a written personalized care plan for preventive services.  I have provided you with a copy of your personalized plan for preventive services. Please take the time to review along with your updated medication list.  Will need prevnar next time--had flu shot 2 weeks ago No PSA due to age 80 to stay fit Will monitor cognitive status--very minor deficiencies today

## 2013-12-04 NOTE — Progress Notes (Signed)
Pre visit review using our clinic review tool, if applicable. No additional management support is needed unless otherwise documented below in the visit note. 

## 2013-12-04 NOTE — Assessment & Plan Note (Signed)
Numb but no pain No Rx needed

## 2013-12-04 NOTE — Assessment & Plan Note (Signed)
See social history 

## 2013-12-04 NOTE — Assessment & Plan Note (Signed)
No problems with statin 

## 2013-12-04 NOTE — Assessment & Plan Note (Signed)
Working on fitness If A1c over 9%, will add low dose lantus

## 2013-12-04 NOTE — Assessment & Plan Note (Signed)
He prefers no follow colonoscopy at this point

## 2013-12-05 ENCOUNTER — Telehealth: Payer: Self-pay | Admitting: Internal Medicine

## 2013-12-05 LAB — LIPID PANEL
CHOL/HDL RATIO: 4
Cholesterol: 161 mg/dL (ref 0–200)
HDL: 42.5 mg/dL (ref >39.00)
LDL CALC: 85 mg/dL (ref 0–99)
NONHDL: 118.5
Triglycerides: 167 mg/dL — ABNORMAL HIGH (ref 0.0–149.0)
VLDL: 33.4 mg/dL (ref 0.0–40.0)

## 2013-12-05 LAB — COMPREHENSIVE METABOLIC PANEL
ALK PHOS: 100 U/L (ref 39–117)
ALT: 21 U/L (ref 0–53)
AST: 17 U/L (ref 0–37)
Albumin: 3.3 g/dL — ABNORMAL LOW (ref 3.5–5.2)
BILIRUBIN TOTAL: 0.9 mg/dL (ref 0.2–1.2)
BUN: 24 mg/dL — ABNORMAL HIGH (ref 6–23)
CO2: 28 mEq/L (ref 19–32)
Calcium: 9.7 mg/dL (ref 8.4–10.5)
Chloride: 104 mEq/L (ref 96–112)
Creatinine, Ser: 1.3 mg/dL (ref 0.4–1.5)
GFR: 59.08 mL/min — ABNORMAL LOW (ref >60.00)
GLUCOSE: 195 mg/dL — AB (ref 70–99)
Potassium: 5 mEq/L (ref 3.5–5.1)
Sodium: 139 mEq/L (ref 135–145)
Total Protein: 6.9 g/dL (ref 6.0–8.3)

## 2013-12-05 LAB — T4, FREE: Free T4: 1.18 ng/dL (ref 0.60–1.60)

## 2013-12-05 NOTE — Telephone Encounter (Signed)
emmi emailed °

## 2013-12-09 ENCOUNTER — Other Ambulatory Visit: Payer: Self-pay | Admitting: *Deleted

## 2013-12-09 MED ORDER — INSULIN GLARGINE 100 UNIT/ML SOLOSTAR PEN: 10.0000 [IU] | PEN_INJECTOR | Freq: Every day | SUBCUTANEOUS | Status: DC

## 2013-12-09 MED ORDER — "PEN NEEDLES 5/16"" 31G X 8 MM MISC": Status: DC

## 2013-12-30 ENCOUNTER — Ambulatory Visit: Payer: Medicare Other | Admitting: Family Medicine

## 2014-03-05 ENCOUNTER — Ambulatory Visit (INDEPENDENT_AMBULATORY_CARE_PROVIDER_SITE_OTHER): Payer: Medicare Other | Admitting: Internal Medicine

## 2014-03-05 ENCOUNTER — Encounter: Payer: Self-pay | Admitting: Internal Medicine

## 2014-03-05 VITALS — BP 100/60 | HR 108 | Temp 97.0°F | Wt 251.0 lb

## 2014-03-05 DIAGNOSIS — E1159 Type 2 diabetes mellitus with other circulatory complications: Secondary | ICD-10-CM | POA: Diagnosis not present

## 2014-03-05 DIAGNOSIS — IMO0002 Reserved for concepts with insufficient information to code with codable children: Secondary | ICD-10-CM

## 2014-03-05 DIAGNOSIS — E1151 Type 2 diabetes mellitus with diabetic peripheral angiopathy without gangrene: Secondary | ICD-10-CM

## 2014-03-05 DIAGNOSIS — E1165 Type 2 diabetes mellitus with hyperglycemia: Principal | ICD-10-CM

## 2014-03-05 LAB — HEMOGLOBIN A1C: Hgb A1c MFr Bld: 7.7 % — ABNORMAL HIGH (ref 4.6–6.5)

## 2014-03-05 NOTE — Progress Notes (Signed)
Pre visit review using our clinic review tool, if applicable. No additional management support is needed unless otherwise documented below in the visit note. 

## 2014-03-05 NOTE — Assessment & Plan Note (Signed)
Much better based on his measurements i suspect that is as much due to his diet changes as the insulin---but he is tolerating this well Counseled all of 15 minute visit

## 2014-03-05 NOTE — Progress Notes (Signed)
Subjective:    Patient ID: Shane Grant, male    DOB: 1931-10-29, 81 y.o.   MRN: [id number]  HPI Here for follow up of diabetes  He is very satisfied with the insulin Has just been taking the 10 units No problems using it fasting sugars every morning--- 10 day average went from 188 to 123 in past month  Has really adjusted his diet---despite getting conflicting ideas from his reading Has cut out white bread--just having rye and sourdough Stopped his fruit juices Weight down 8# despite starting the insulin  Current Outpatient Prescriptions on File Prior to Visit  Medication Sig Dispense Refill  . aspirin 81 MG tablet Take 81 mg by mouth daily.      Marland Kitchen atorvastatin (LIPITOR) 80 MG tablet TAKE 1 TABLET (80 MG TOTAL) BY MOUTH DAILY. 90 tablet 1  . glipiZIDE (GLUCOTROL) 5 MG tablet TAKE 1 TABLET (5 MG TOTAL) BY MOUTH 2 (TWO) TIMES DAILY BEFORE A MEAL. 180 tablet 3  . Insulin Glargine (LANTUS SOLOSTAR) 100 UNIT/ML Solostar Pen Inject 10 Units into the skin daily. 15 mL 11  . Insulin Pen Needle (PEN NEEDLES 31GX5/16") 31G X 8 MM MISC Use to inject insulin dx: E11.40 100 each 1  . lisinopril (PRINIVIL,ZESTRIL) 5 MG tablet Take 5 mg by mouth daily.    . metFORMIN (GLUCOPHAGE) 1000 MG tablet TAKE 1 TABLET (1,000 MG TOTAL) BY MOUTH 2 (TWO) TIMES DAILY WITH A MEAL. 180 tablet 3  . polyethylene glycol (MIRALAX / GLYCOLAX) packet Take 17 g by mouth daily as needed.     No current facility-administered medications on file prior to visit.    Allergies  Allergen Reactions  . Invokana [Canagliflozin]     Raised sugars and he felt bad  . Ancef [Cefazolin Sodium] Nausea Only    Past Medical History  Diagnosis Date  . Hypertension ~1997  . Hyperlipidemia   . Diabetes mellitus   . Colon cancer 2003    Partial colectomy for cancer in polyp  . Kidney stones 2000    lithotripsy  . Urge incontinence of urine     Past Surgical History  Procedure Laterality Date  . Tonsillectomy and  adenoidectomy  1947  . Appendectomy  2003    with colectomy  . Partial colectomy  2003    ascending colon  . Hepatic lesion  2011    Cryoablation by radiologist  . Lithotripsy  2000  . Cataract extraction w/ intraocular lens  implant, bilateral Bilateral 11/14, 12/14    Dr George Ina    Family History  Problem Relation Age of Onset  . Heart disease Neg Hx   . Hypertension Neg Hx   . Diabetes Neg Hx   . Cancer Brother     lung    History   Social History  . Marital Status: Married    Spouse Name: N/A    Number of Children: 3  . Years of Education: N/A   Occupational History  . Land    Retired   Social History Main Topics  . Smoking status: Former Smoker    Types: Cigars  . Smokeless tobacco: Never Used  . Alcohol Use: Yes     Comment: Glass of wine or scotch before dinner  . Drug Use: No  . Sexual Activity: Not on file   Other Topics Concern  . Not on file   Social History Narrative   3 sons all married with 2 children   Has living will  Wife, then son Arva Chafe, is health care POA   Would accept resuscitation but no prolonged artificial ventilation   Would probably not want tube feedings   Review of Systems Still on glipizide and metformin Sleeping fine No pain or problems with the injection sites    Objective:   Physical Exam  Psychiatric: He has a normal mood and affect. His behavior is normal.          Assessment & Plan:

## 2014-04-05 ENCOUNTER — Other Ambulatory Visit: Payer: Self-pay | Admitting: Internal Medicine

## 2014-04-17 ENCOUNTER — Other Ambulatory Visit: Payer: Self-pay | Admitting: Internal Medicine

## 2014-04-23 ENCOUNTER — Other Ambulatory Visit: Payer: Self-pay | Admitting: Internal Medicine

## 2014-06-04 ENCOUNTER — Encounter: Payer: Self-pay | Admitting: Internal Medicine

## 2014-06-04 ENCOUNTER — Ambulatory Visit (INDEPENDENT_AMBULATORY_CARE_PROVIDER_SITE_OTHER): Payer: Medicare Other | Admitting: Internal Medicine

## 2014-06-04 VITALS — BP 120/70 | HR 77 | Temp 98.6°F | Wt 249.0 lb

## 2014-06-04 DIAGNOSIS — E785 Hyperlipidemia, unspecified: Secondary | ICD-10-CM

## 2014-06-04 DIAGNOSIS — I739 Peripheral vascular disease, unspecified: Secondary | ICD-10-CM

## 2014-06-04 DIAGNOSIS — I10 Essential (primary) hypertension: Secondary | ICD-10-CM | POA: Diagnosis not present

## 2014-06-04 DIAGNOSIS — I350 Nonrheumatic aortic (valve) stenosis: Secondary | ICD-10-CM | POA: Insufficient documentation

## 2014-06-04 DIAGNOSIS — E1140 Type 2 diabetes mellitus with diabetic neuropathy, unspecified: Secondary | ICD-10-CM

## 2014-06-04 DIAGNOSIS — I358 Other nonrheumatic aortic valve disorders: Secondary | ICD-10-CM

## 2014-06-04 DIAGNOSIS — I359 Nonrheumatic aortic valve disorder, unspecified: Secondary | ICD-10-CM

## 2014-06-04 LAB — HM DIABETES FOOT EXAM

## 2014-06-04 LAB — HEMOGLOBIN A1C: Hgb A1c MFr Bld: 6.7 % — ABNORMAL HIGH (ref 4.6–6.5)

## 2014-06-04 MED ORDER — LISINOPRIL 10 MG PO TABS: 5.0000 mg | ORAL_TABLET | Freq: Every day | ORAL | Status: DC

## 2014-06-04 NOTE — Assessment & Plan Note (Signed)
Seems to have better control Very slight neuropathic changes--- may remit with better control Will recheck A1c

## 2014-06-04 NOTE — Assessment & Plan Note (Signed)
Still notes right > left leg pain after walking ~1/4 mile. Normal circulation so probably from spinal stenosis

## 2014-06-04 NOTE — Assessment & Plan Note (Signed)
BP Readings from Last 3 Encounters:  06/04/14 120/70  03/05/14 100/60  12/04/13 108/58   Good control Not sure if lisinopril 5 or 10--but will continue the current dose

## 2014-06-04 NOTE — Progress Notes (Signed)
Subjective:    Patient ID: Shane Grant, male    DOB: 1931/10/27, 81 y.o.   MRN: [id number]  HPI Here for follow up of diabetes and other conditions  Still satisfied with the insulin Continues to check daily in AM 75-158====average about 120 No hypoglycemic reactions He doesn't notice pain or sensory changes in feet Really has cut out sugars so weight down  No chest pain  No SOB Does get some mild orthostatic dizziness but no syncope No ankle swelling He is not sure of lisinopril dose--may be cutting 10's in half  No myalgia No GI problems  Current Outpatient Prescriptions on File Prior to Visit  Medication Sig Dispense Refill  . aspirin 81 MG tablet Take 81 mg by mouth daily.      Marland Kitchen atorvastatin (LIPITOR) 80 MG tablet TAKE 1 TABLET BY MOUTH EVERY DAY 90 tablet 3  . glipiZIDE (GLUCOTROL) 5 MG tablet TAKE 1 TABLET (5 MG TOTAL) BY MOUTH 2 (TWO) TIMES DAILY BEFORE A MEAL. 180 tablet 2  . Insulin Glargine (LANTUS SOLOSTAR) 100 UNIT/ML Solostar Pen Inject 10 Units into the skin daily. 15 mL 11  . Insulin Pen Needle (PEN NEEDLES 31GX5/16") 31G X 8 MM MISC Use to inject insulin dx: E11.40 100 each 1  . metFORMIN (GLUCOPHAGE) 1000 MG tablet TAKE 1 TABLET (1,000 MG TOTAL) BY MOUTH 2 (TWO) TIMES DAILY WITH A MEAL. 180 tablet 3  . polyethylene glycol (MIRALAX / GLYCOLAX) packet Take 17 g by mouth daily as needed.     No current facility-administered medications on file prior to visit.    Allergies  Allergen Reactions  . Invokana [Canagliflozin]     Raised sugars and he felt bad  . Ancef [Cefazolin Sodium] Nausea Only    Past Medical History  Diagnosis Date  . Hypertension ~1997  . Hyperlipidemia   . Diabetes mellitus   . Colon cancer 2003    Partial colectomy for cancer in polyp  . Kidney stones 2000    lithotripsy  . Urge incontinence of urine     Past Surgical History  Procedure Laterality Date  . Tonsillectomy and adenoidectomy  1947  . Appendectomy  2003   with colectomy  . Partial colectomy  2003    ascending colon  . Hepatic lesion  2011    Cryoablation by radiologist  . Lithotripsy  2000  . Cataract extraction w/ intraocular lens  implant, bilateral Bilateral 11/14, 12/14    Dr Shane Grant    Family History  Problem Relation Age of Onset  . Heart disease Neg Hx   . Hypertension Neg Hx   . Diabetes Neg Hx   . Cancer Brother     lung    History   Social History  . Marital Status: Married    Spouse Name: N/A  . Number of Children: 3  . Years of Education: N/A   Occupational History  . Land    Retired   Social History Main Topics  . Smoking status: Former Smoker    Types: Cigars  . Smokeless tobacco: Never Used  . Alcohol Use: Yes     Comment: Glass of wine or scotch before dinner  . Drug Use: No  . Sexual Activity: Not on file   Other Topics Concern  . Not on file   Social History Narrative   3 sons all married with 2 children   Has living will   Wife, then son Shane Grant, is health care POA  Would accept resuscitation but no prolonged artificial ventilation   Would probably not want tube feedings   Review of Systems Sleeps fairly well. Does have frequent nocturia. No daytime urgency but does wear a pad if out for a while due to some leakage Appetite is fine Bowels are good    Objective:   Physical Exam  Constitutional: He appears well-developed and well-nourished. No distress.  Neck: Normal range of motion. Neck supple. No thyromegaly present.  Cardiovascular: Normal rate, regular rhythm and intact distal pulses.  Exam reveals no gallop.   Soft aortic systolic murmur  Pulmonary/Chest: Effort normal and breath sounds normal. No respiratory distress. He has no wheezes. He has no rales.  Musculoskeletal: He exhibits no edema or tenderness.  Lymphadenopathy:    He has no cervical adenopathy.  Neurological:  Only decrease in filament testing--feels soft touch on feet  Skin:  No foot  lesions  Psychiatric: He has a normal mood and affect. His behavior is normal.          Assessment & Plan:

## 2014-06-04 NOTE — Assessment & Plan Note (Signed)
Probably just sclerosis since echo fairly normal in 2014

## 2014-06-04 NOTE — Assessment & Plan Note (Signed)
No problems with statin Lab Results  Component Value Date   LDLCALC 85 12/04/2013

## 2014-06-05 ENCOUNTER — Telehealth: Payer: Self-pay | Admitting: Internal Medicine

## 2014-06-05 ENCOUNTER — Other Ambulatory Visit: Payer: Self-pay | Admitting: Internal Medicine

## 2014-06-06 NOTE — Op Note (Signed)
PATIENT NAME:  Shane Grant, Shane Grant MR#:  [id number] DATE OF BIRTH:  [birth year]  DATE OF PROCEDURE:  01/01/2013  ADDENDUM:   Please note the cefuroxime was not placed within the anterior chamber due to Keflex allergy.  Rather, 0.2 mL of a 50/50 mixture of Vigamox and BSS was placed within the eye at the end of the case.    ____________________________ Livingston Diones. Salina Stanfield, MD wlp:cc D: 01/01/2013 20:44:21 ET T: 01/01/2013 21:31:10 ET JOB#: [id number]  cc: Katisha Shimizu L. Elisse Pennick, MD, <Dictator> Livingston Diones Kawika Bischoff MD ELECTRONICALLY SIGNED 01/02/2013 13:28

## 2014-06-06 NOTE — Op Note (Signed)
PATIENT NAME:  Shane Grant, Shane Grant MR#:  [id number] DATE OF BIRTH:  10-19-1931  DATE OF PROCEDURE:  01/22/2013  PREOPERATIVE DIAGNOSIS: Visually significant cataract of the left eye.   POSTOPERATIVE DIAGNOSIS: Visually significant cataract of the left eye.   OPERATIVE PROCEDURE: Cataract extraction by phacoemulsification with implant of intraocular lens to the left eye.   SURGEON: Birder Robson, MD.   ANESTHESIA:  1. Managed anesthesia care.  2. Topical tetracaine drops followed by 2% Xylocaine jelly applied in the preoperative holding area.   COMPLICATIONS: None.   TECHNIQUE:  Stop and chop.   DESCRIPTION OF PROCEDURE: The patient was examined and consented in the preoperative holding area where the aforementioned topical anesthesia was applied to the left eye and then brought back to the Operating Room where the left eye was prepped and draped in the usual sterile ophthalmic fashion and a lid speculum was placed. A paracentesis was created with the side port blade and the anterior chamber was filled with viscoelastic. A near clear corneal incision was performed with the steel keratome. A continuous curvilinear capsulorrhexis was performed with a cystotome followed by the capsulorrhexis forceps. Hydrodissection and hydrodelineation were carried out with BSS on a blunt cannula. The lens was removed in a stop and chop technique and the remaining cortical material was removed with the irrigation-aspiration handpiece. The capsular bag was inflated with viscoelastic and the Tecnis ZCB00 22.0-diopter lens, serial number [id number] was placed in the capsular bag without complication. The remaining viscoelastic was removed from the eye with the irrigation-aspiration handpiece. The wounds were hydrated. The anterior chamber was flushed with Miostat and the eye was inflated to physiologic pressure. Please note that cefuroxime was not placed in the anterior chamber due to CEPHALOSPORIN allergy. The wounds  were found to be water tight. The eye was dressed with Vigamox. The patient was given protective glasses to wear throughout the day and a shield with which to sleep tonight. The patient was also given drops with which to begin a drop regimen today and will follow-up with me in one day.   ____________________________ Livingston Diones. Calieb Lichtman, MD wlp:gb D: 01/22/2013 22:09:23 ET T: 01/22/2013 22:41:49 ET JOB#: [id number]  cc: Xaniyah Buchholz L. Arcadia Gorgas, MD, <Dictator> Livingston Diones Teana Lindahl MD ELECTRONICALLY SIGNED 01/23/2013 12:08

## 2014-06-06 NOTE — Op Note (Signed)
PATIENT NAME:  Shane Grant, Shane Grant MR#:  [id number] DATE OF BIRTH:  Aug 07, 1931  DATE OF PROCEDURE:   01/01/2013  PREOPERATIVE DIAGNOSIS: Visually significant cataract of the right eye.   POSTOPERATIVE DIAGNOSIS: Visually significant cataract of the right eye.   OPERATIVE PROCEDURE: Cataract extraction by phacoemulsification with implant of intraocular lens to right eye.   SURGEON: Birder Robson, MD.   ANESTHESIA:  1. Managed anesthesia care.  2. Topical tetracaine drops followed by 2% Xylocaine jelly applied in the preoperative holding area.   COMPLICATIONS: None.   TECHNIQUE:  Stop and chop.  DESCRIPTION OF PROCEDURE: The patient was examined and consented in the preoperative holding area where the aforementioned topical anesthesia was applied to the right eye and then brought back to the Operating Room where the right eye was prepped and draped in the usual sterile ophthalmic fashion and a lid speculum was placed. A paracentesis was created with the side port blade and the anterior chamber was filled with viscoelastic. A near clear corneal incision was performed with the steel keratome. A continuous curvilinear capsulorrhexis was performed with a cystotome followed by the capsulorrhexis forceps. Hydrodissection and hydrodelineation were carried out with BSS on a blunt cannula. The lens was removed in a stop and chop technique and the remaining cortical material was removed with the irrigation-aspiration handpiece. The capsular bag was inflated with viscoelastic and the Tecnis ZCB00 21.0-diopter lens, serial number [id number] was placed in the capsular bag without complication. The remaining viscoelastic was removed from the eye with the irrigation-aspiration handpiece. The wounds were hydrated. The anterior chamber was flushed with Miostat and the eye was inflated to physiologic pressure. 0.1 mL of cefuroxime concentration 10 mg/mL was placed in the anterior chamber. The wounds were found to be  water tight. The eye was dressed with Vigamox. The patient was given protective glasses to wear throughout the day and a shield with which to sleep tonight. The patient was also given drops with which to begin a drop regimen today and will follow up with me in one day.   ____________________________ Livingston Diones. Brookelin Felber, MD wlp:dmm D: 01/25/2013 12:12:00 ET T: 01/25/2013 12:19:52 ET JOB#: [id number]  cc: [place] Talcott L. Enis Riecke, MD, <Dictator> Livingston Diones Tyreisha Ungar MD ELECTRONICALLY SIGNED 01/30/2013 9:49

## 2014-07-19 ENCOUNTER — Other Ambulatory Visit: Payer: Self-pay | Admitting: Internal Medicine

## 2014-08-22 DIAGNOSIS — H3554 Dystrophies primarily involving the retinal pigment epithelium: Secondary | ICD-10-CM | POA: Diagnosis not present

## 2014-08-22 LAB — HM DIABETES EYE EXAM

## 2014-08-26 ENCOUNTER — Encounter: Payer: Self-pay | Admitting: Internal Medicine

## 2014-10-02 ENCOUNTER — Other Ambulatory Visit: Payer: Self-pay | Admitting: Internal Medicine

## 2014-12-12 ENCOUNTER — Other Ambulatory Visit: Payer: Self-pay | Admitting: Internal Medicine

## 2014-12-18 ENCOUNTER — Encounter: Payer: Medicare Other | Admitting: Internal Medicine

## 2015-01-01 ENCOUNTER — Ambulatory Visit (INDEPENDENT_AMBULATORY_CARE_PROVIDER_SITE_OTHER): Payer: Medicare Other | Admitting: Internal Medicine

## 2015-01-01 ENCOUNTER — Encounter: Payer: Self-pay | Admitting: Internal Medicine

## 2015-01-01 VITALS — BP 110/60 | HR 76 | Temp 98.0°F | Ht 78.0 in | Wt 243.0 lb

## 2015-01-01 DIAGNOSIS — I739 Peripheral vascular disease, unspecified: Secondary | ICD-10-CM | POA: Diagnosis not present

## 2015-01-01 DIAGNOSIS — E1140 Type 2 diabetes mellitus with diabetic neuropathy, unspecified: Secondary | ICD-10-CM | POA: Diagnosis not present

## 2015-01-01 DIAGNOSIS — E785 Hyperlipidemia, unspecified: Secondary | ICD-10-CM | POA: Diagnosis not present

## 2015-01-01 DIAGNOSIS — Z0000 Encounter for general adult medical examination without abnormal findings: Secondary | ICD-10-CM

## 2015-01-01 DIAGNOSIS — I700 Atherosclerosis of aorta: Secondary | ICD-10-CM

## 2015-01-01 DIAGNOSIS — Z23 Encounter for immunization: Secondary | ICD-10-CM | POA: Diagnosis not present

## 2015-01-01 DIAGNOSIS — I10 Essential (primary) hypertension: Secondary | ICD-10-CM

## 2015-01-01 DIAGNOSIS — Z85038 Personal history of other malignant neoplasm of large intestine: Secondary | ICD-10-CM

## 2015-01-01 DIAGNOSIS — IMO0001 Reserved for inherently not codable concepts without codable children: Secondary | ICD-10-CM

## 2015-01-01 MED ORDER — TETANUS-DIPHTHERIA TOXOIDS TD 5-2 LFU IM INJ: 0.5000 mL | INJECTION | Freq: Once | INTRAMUSCULAR | Status: DC

## 2015-01-01 NOTE — Assessment & Plan Note (Signed)
Doing great with lifestyle Will check A1c again

## 2015-01-01 NOTE — Assessment & Plan Note (Signed)
No worrisome symptoms  No colonoscopy after discussion

## 2015-01-01 NOTE — Assessment & Plan Note (Signed)
BP Readings from Last 3 Encounters:  01/01/15 110/60  06/04/14 120/70  03/05/14 100/60   Good control On ACEI

## 2015-01-01 NOTE — Progress Notes (Signed)
Subjective:    Patient ID: Shane Grant, male    DOB: 1931/12/11, 81 y.o.   MRN: [id number]  HPI Here for Medicare wellness and follow up of chronic medical conditions Reviewed form and advanced directives Reviewed other doctors 1-2 drinks--wine or scotch--daily No tobacco Regular exercise-- 3 days per week Vision is good. Cataracts removed >1 year ago Hearing okay with aides No falls No depression or anhedonia Independent with instrumental ADLS Mild memory issues---but nothing that is worrisome  Has really given up sweets and carbs Has lost 18# in past year Sugars 85-150---mostly in 120's.  Checks once a day Decreased sensation in feet---no pain though No leg pain but has leg weakness with extended walking  Worsening urinary incontinence Has to wear a pad all the time Trying to limit travel due to this--but no changes in local activities He thinks the stream is fine  Bowels are fine Some leakage once in a while No blood  No chest pain No SOB No dizziness or syncope. Just slight orthostatic dizziness--passes quickly Exercise tolerance better on exercise bike/elliptical--than walking No edema  Current Outpatient Prescriptions on File Prior to Visit  Medication Sig Dispense Refill  . aspirin 81 MG tablet Take 81 mg by mouth daily.      Marland Kitchen atorvastatin (LIPITOR) 80 MG tablet TAKE 1 TABLET BY MOUTH EVERY DAY 90 tablet 3  . B-D ULTRAFINE III SHORT PEN 31G X 8 MM MISC USE TO INJECT INSULIN DX: E11.40 100 each 3  . glipiZIDE (GLUCOTROL) 5 MG tablet TAKE 1 TABLET (5 MG TOTAL) BY MOUTH 2 (TWO) TIMES DAILY BEFORE A MEAL. 180 tablet 2  . Insulin Glargine (LANTUS SOLOSTAR) 100 UNIT/ML Solostar Pen Inject 10 Units into the skin daily. - Subcutaneous 15 mL 0  . lisinopril (PRINIVIL,ZESTRIL) 10 MG tablet TAKE 1 TABLET (10 MG TOTAL) BY MOUTH DAILY. 90 tablet 3  . metFORMIN (GLUCOPHAGE) 1000 MG tablet TAKE 1 TABLET (1,000 MG TOTAL) BY MOUTH 2 (TWO) TIMES DAILY WITH A MEAL. 180  tablet 3  . polyethylene glycol (MIRALAX / GLYCOLAX) packet Take 17 g by mouth daily as needed.     No current facility-administered medications on file prior to visit.    Allergies  Allergen Reactions  . Invokana [Canagliflozin]     Raised sugars and he felt bad  . Ancef [Cefazolin Sodium] Nausea Only    Past Medical History  Diagnosis Date  . Hypertension ~1997  . Hyperlipidemia   . Diabetes mellitus   . Colon cancer ([place]) 2003    Partial colectomy for cancer in polyp  . Kidney stones 2000    lithotripsy  . Urge incontinence of urine     Past Surgical History  Procedure Laterality Date  . Tonsillectomy and adenoidectomy  1947  . Appendectomy  2003    with colectomy  . Partial colectomy  2003    ascending colon  . Hepatic lesion  2011    Cryoablation by radiologist  . Lithotripsy  2000  . Cataract extraction w/ intraocular lens  implant, bilateral Bilateral 11/14, 12/14    Dr George Ina    Family History  Problem Relation Age of Onset  . Heart disease Neg Hx   . Hypertension Neg Hx   . Diabetes Neg Hx   . Cancer Brother     lung    Social History   Social History  . Marital Status: Married    Spouse Name: N/A  . Number of Children: 3  . Years of  Education: N/A   Occupational History  . Land    Retired   Social History Main Topics  . Smoking status: Former Smoker    Types: Cigars  . Smokeless tobacco: Never Used  . Alcohol Use: Yes     Comment: Glass of wine or scotch before dinner  . Drug Use: No  . Sexual Activity: Not on file   Other Topics Concern  . Not on file   Social History Narrative   3 sons all married with 2 children   Has living will   Wife, then son Arva Chafe, is health care POA   Would accept resuscitation but no prolonged artificial ventilation   Would probably not want tube feedings   Review of Systems Nocturia x 4--but sleeps okay Appetite is okay Wears seat belt Teeth are good---keeps up with  dentist No sig joint problems No rashes or suspicious lesions    Objective:   Physical Exam  Constitutional: He is oriented to person, place, and time. He appears well-developed and well-nourished. No distress.  HENT:  Mouth/Throat: Oropharynx is clear and moist. No oropharyngeal exudate.  Neck: Normal range of motion. Neck supple. No thyromegaly present.  Cardiovascular: Normal rate and regular rhythm.  Exam reveals no gallop.   Faint pedal pulses Gr 3/6 aortic systolic murmur radiating to apex  Pulmonary/Chest: Effort normal and breath sounds normal. No respiratory distress. He has no wheezes. He has no rales.  Abdominal: Soft. There is no tenderness.  Musculoskeletal: He exhibits no edema or tenderness.  Lymphadenopathy:    He has no cervical adenopathy.  Neurological: He is alert and oriented to person, place, and time.  President-- "Obama, Clinton, (then) Bush" [id number] D-l-r-o-w Recall 2/3  Decreased sensation in feet  Skin: No rash noted. No erythema.  No foot lesions  Psychiatric: He has a normal mood and affect.          Assessment & Plan:

## 2015-01-01 NOTE — Assessment & Plan Note (Signed)
No striking stenosis No action needed

## 2015-01-01 NOTE — Assessment & Plan Note (Signed)
Still with symptoms but vascular studies reassuring No action needed

## 2015-01-01 NOTE — Assessment & Plan Note (Signed)
I have personally reviewed the Medicare Annual Wellness questionnaire and have noted 1. The patient's medical and social history 2. Their use of alcohol, tobacco or illicit drugs 3. Their current medications and supplements 4. The patient's functional ability including ADL's, fall risks, home safety risks and hearing or visual             impairment. 5. Diet and physical activities 6. Evidence for depression or mood disorders  The patients weight, height, BMI and visual acuity have been recorded in the chart I have made referrals, counseling and provided education to the patient based review of the above and I have provided the pt with a written personalized care plan for preventive services.  I have provided you with a copy of your personalized plan for preventive services. Please take the time to review along with your updated medication list.  Flu and prevnar today No cancer screening due to age Otherwise UTD

## 2015-01-01 NOTE — Assessment & Plan Note (Signed)
No problems with statin 

## 2015-01-01 NOTE — Addendum Note (Signed)
Addended by: Despina Hidden on: 01/01/2015 03:30 PM   Modules accepted: Orders

## 2015-01-01 NOTE — Progress Notes (Signed)
Pre visit review using our clinic review tool, if applicable. No additional management support is needed unless otherwise documented below in the visit note. 

## 2015-01-02 LAB — T4, FREE: Free T4: 0.91 ng/dL (ref 0.60–1.60)

## 2015-01-02 LAB — CBC WITH DIFFERENTIAL/PLATELET
BASOS PCT: 0.2 % (ref 0.0–3.0)
Basophils Absolute: 0 10*3/uL (ref 0.0–0.1)
EOS ABS: 0.4 10*3/uL (ref 0.0–0.7)
EOS PCT: 3.2 % (ref 0.0–5.0)
HEMATOCRIT: 39.7 % (ref 39.0–52.0)
Hemoglobin: 13 g/dL (ref 13.0–17.0)
LYMPHS PCT: 15.4 % (ref 12.0–46.0)
Lymphs Abs: 1.8 10*3/uL (ref 0.7–4.0)
MCHC: 32.7 g/dL (ref 30.0–36.0)
MCV: 91.1 fl (ref 78.0–100.0)
MONO ABS: 0.6 10*3/uL (ref 0.1–1.0)
Monocytes Relative: 5.2 % (ref 3.0–12.0)
NEUTROS ABS: 9 10*3/uL — AB (ref 1.4–7.7)
Neutrophils Relative %: 76 % (ref 43.0–77.0)
PLATELETS: 290 10*3/uL (ref 150.0–400.0)
RBC: 4.36 Mil/uL (ref 4.22–5.81)
RDW: 13.5 % (ref 11.5–15.5)
WBC: 11.9 10*3/uL — ABNORMAL HIGH (ref 4.0–10.5)

## 2015-01-02 LAB — HEMOGLOBIN A1C: HEMOGLOBIN A1C: 6.6 % — AB (ref 4.6–6.5)

## 2015-01-02 LAB — COMPREHENSIVE METABOLIC PANEL
ALT: 17 U/L (ref 0–53)
AST: 15 U/L (ref 0–37)
Albumin: 3.8 g/dL (ref 3.5–5.2)
Alkaline Phosphatase: 84 U/L (ref 39–117)
BUN: 31 mg/dL — AB (ref 6–23)
CHLORIDE: 106 meq/L (ref 96–112)
CO2: 26 meq/L (ref 19–32)
CREATININE: 1.18 mg/dL (ref 0.40–1.50)
Calcium: 10.1 mg/dL (ref 8.4–10.5)
GFR: 62.97 mL/min (ref >60.00)
Glucose, Bld: 82 mg/dL (ref 70–99)
Potassium: 4.7 mEq/L (ref 3.5–5.1)
SODIUM: 140 meq/L (ref 135–145)
Total Bilirubin: 0.6 mg/dL (ref 0.2–1.2)
Total Protein: 6.7 g/dL (ref 6.0–8.3)

## 2015-01-02 LAB — LIPID PANEL
CHOLESTEROL: 163 mg/dL (ref 0–200)
HDL: 46.9 mg/dL (ref >39.00)
LDL CALC: 99 mg/dL (ref 0–99)
NONHDL: 116.56
Total CHOL/HDL Ratio: 3
Triglycerides: 86 mg/dL (ref 0.0–149.0)
VLDL: 17.2 mg/dL (ref 0.0–40.0)

## 2015-01-06 ENCOUNTER — Other Ambulatory Visit: Payer: Self-pay | Admitting: Internal Medicine

## 2015-02-09 ENCOUNTER — Encounter: Payer: Self-pay | Admitting: Internal Medicine

## 2015-02-12 ENCOUNTER — Encounter: Payer: Self-pay | Admitting: *Deleted

## 2015-04-14 ENCOUNTER — Encounter: Payer: Self-pay | Admitting: Internal Medicine

## 2015-04-14 ENCOUNTER — Ambulatory Visit (INDEPENDENT_AMBULATORY_CARE_PROVIDER_SITE_OTHER): Payer: Medicare Other | Admitting: Internal Medicine

## 2015-04-14 VITALS — BP 118/60 | HR 70 | Temp 98.7°F | Wt 246.0 lb

## 2015-04-14 DIAGNOSIS — J209 Acute bronchitis, unspecified: Secondary | ICD-10-CM | POA: Diagnosis not present

## 2015-04-14 DIAGNOSIS — R29898 Other symptoms and signs involving the musculoskeletal system: Secondary | ICD-10-CM | POA: Insufficient documentation

## 2015-04-14 MED ORDER — INSULIN GLARGINE 100 UNIT/ML SOLOSTAR PEN: PEN_INJECTOR | SUBCUTANEOUS | Status: DC

## 2015-04-14 MED ORDER — ATORVASTATIN CALCIUM 80 MG PO TABS: 80.0000 mg | ORAL_TABLET | Freq: Every day | ORAL | Status: DC

## 2015-04-14 MED ORDER — METFORMIN HCL 1000 MG PO TABS: 1000.0000 mg | ORAL_TABLET | Freq: Two times a day (BID) | ORAL | Status: DC

## 2015-04-14 NOTE — Assessment & Plan Note (Signed)
Brief Also "smoky" vision and shuffling All in 1 day but separate No symptoms that suggest true TIA---suspect side effect from the cold meds No action now Discussed warning signs of CVA--- call 911 if these occur

## 2015-04-14 NOTE — Assessment & Plan Note (Signed)
Probably viral Discussed supportive Rx Consider antibiotic if worsening in several days

## 2015-04-14 NOTE — Progress Notes (Signed)
Pre visit review using our clinic review tool, if applicable. No additional management support is needed unless otherwise documented below in the visit note. 

## 2015-04-14 NOTE — Addendum Note (Signed)
Addended by: Despina Hidden on: 04/14/2015 08:30 AM   Modules accepted: Orders

## 2015-04-14 NOTE — Progress Notes (Signed)
Subjective:    Patient ID: Shane Grant, male    DOB: Aug 04, 1931, 82 y.o.   MRN: [id number]  HPI Here due to cough--with wife  In the 4th day of cold Thick congestion in chest Cough productive at times---not able to sleep in bed No fever No chills or sweats Not SOB No ear pain but does have sore throat at first No muscle aches  Incident 3 days ago--was on couch and couldn't get up Needed security to help him get up--brought him to bed Then able to stand and use walker Arms were not weak Noticed some "smoky" vision that morning Shuffling walking that afternoon--only 1 time before the incident on couch No facial droop, aphasia, dysphagia, no arm weakness  Took aspirin for the cold. mucinex at first --- with decongestant (same day as episode)  Current Outpatient Prescriptions on File Prior to Visit  Medication Sig Dispense Refill  . aspirin 81 MG tablet Take 81 mg by mouth daily.      Marland Kitchen atorvastatin (LIPITOR) 80 MG tablet Take 1 tablet (80 mg total) by mouth daily. 90 tablet 0  . B-D ULTRAFINE III SHORT PEN 31G X 8 MM MISC USE TO INJECT INSULIN DX: E11.40 100 each 3  . glipiZIDE (GLUCOTROL) 5 MG tablet TAKE 1 TABLET BY MOUTH TWICE A DAY BEFORE A MEAL 180 tablet 2  . Insulin Glargine (LANTUS SOLOSTAR) 100 UNIT/ML Solostar Pen Inject 10 Units into the skin daily. - Subcutaneous 15 mL 0  . lisinopril (PRINIVIL,ZESTRIL) 10 MG tablet TAKE 1 TABLET (10 MG TOTAL) BY MOUTH DAILY. 90 tablet 3  . metFORMIN (GLUCOPHAGE) 1000 MG tablet Take 1 tablet (1,000 mg total) by mouth 2 (two) times daily with a meal. 180 tablet 0  . polyethylene glycol (MIRALAX / GLYCOLAX) packet Take 17 g by mouth daily as needed.     No current facility-administered medications on file prior to visit.    Allergies  Allergen Reactions  . Invokana [Canagliflozin]     Raised sugars and he felt bad  . Ancef [Cefazolin Sodium] Nausea Only    Past Medical History  Diagnosis Date  . Hypertension ~1997  .  Hyperlipidemia   . Diabetes mellitus   . Colon cancer ([place]) 2003    Partial colectomy for cancer in polyp  . Kidney stones 2000    lithotripsy  . Urge incontinence of urine     Past Surgical History  Procedure Laterality Date  . Tonsillectomy and adenoidectomy  1947  . Appendectomy  2003    with colectomy  . Partial colectomy  2003    ascending colon  . Hepatic lesion  2011    Cryoablation by radiologist  . Lithotripsy  2000  . Cataract extraction w/ intraocular lens  implant, bilateral Bilateral 11/14, 12/14    Dr George Ina    Family History  Problem Relation Age of Onset  . Heart disease Neg Hx   . Hypertension Neg Hx   . Diabetes Neg Hx   . Cancer Brother     lung    Social History   Social History  . Marital Status: Married    Spouse Name: N/A  . Number of Children: 3  . Years of Education: N/A   Occupational History  . Land    Retired   Social History Main Topics  . Smoking status: Former Smoker    Types: Cigars  . Smokeless tobacco: Never Used  . Alcohol Use: Yes  Comment: Glass of wine or scotch before dinner  . Drug Use: No  . Sexual Activity: Not on file   Other Topics Concern  . Not on file   Social History Narrative   3 sons all married with 2 children   Has living will   Wife, then son Arva Chafe, is health care POA   Would accept resuscitation but no prolonged artificial ventilation   Would probably not want tube feedings   Review of Systems Appetite is okay No vomiting or diarrhea No rash    Objective:   Physical Exam  Constitutional: He is oriented to person, place, and time. He appears well-developed and well-nourished. No distress.  HENT:  No sinus tenderness Pharynx without exudate or injection  Neck: Normal range of motion. Neck supple. No thyromegaly present.  Pulmonary/Chest: Effort normal. No respiratory distress. He has no wheezes. He has no rales.  Scattered rhonchi  Lymphadenopathy:    He  has no cervical adenopathy.  Neurological: He is alert and oriented to person, place, and time. He has normal strength. No cranial nerve deficit. He exhibits normal muscle tone. He displays a negative Romberg sign. Coordination and gait normal.          Assessment & Plan:

## 2015-04-21 ENCOUNTER — Encounter: Payer: Self-pay | Admitting: Internal Medicine

## 2015-05-16 ENCOUNTER — Encounter: Payer: Self-pay | Admitting: Internal Medicine

## 2015-06-04 ENCOUNTER — Other Ambulatory Visit: Payer: Self-pay

## 2015-06-04 MED ORDER — INSULIN PEN NEEDLE 31G X 8 MM MISC: Status: DC

## 2015-06-04 NOTE — Telephone Encounter (Signed)
Rx sent electronically.  

## 2015-07-03 ENCOUNTER — Ambulatory Visit (INDEPENDENT_AMBULATORY_CARE_PROVIDER_SITE_OTHER): Payer: Medicare Other | Admitting: Internal Medicine

## 2015-07-03 ENCOUNTER — Encounter: Payer: Self-pay | Admitting: Internal Medicine

## 2015-07-03 VITALS — BP 118/76 | HR 75 | Temp 98.0°F | Wt 245.5 lb

## 2015-07-03 DIAGNOSIS — E1140 Type 2 diabetes mellitus with diabetic neuropathy, unspecified: Secondary | ICD-10-CM | POA: Diagnosis not present

## 2015-07-03 DIAGNOSIS — IMO0001 Reserved for inherently not codable concepts without codable children: Secondary | ICD-10-CM

## 2015-07-03 DIAGNOSIS — I739 Peripheral vascular disease, unspecified: Secondary | ICD-10-CM

## 2015-07-03 DIAGNOSIS — I700 Atherosclerosis of aorta: Secondary | ICD-10-CM | POA: Diagnosis not present

## 2015-07-03 DIAGNOSIS — I10 Essential (primary) hypertension: Secondary | ICD-10-CM

## 2015-07-03 LAB — MICROALBUMIN / CREATININE URINE RATIO
CREATININE, U: 88.9 mg/dL
MICROALB/CREAT RATIO: 4.4 mg/g (ref 0.0–30.0)
Microalb, Ur: 3.9 mg/dL — ABNORMAL HIGH (ref 0.0–1.9)

## 2015-07-03 LAB — HEMOGLOBIN A1C: Hgb A1c MFr Bld: 6.8 % — ABNORMAL HIGH (ref 4.6–6.5)

## 2015-07-03 LAB — HM DIABETES FOOT EXAM

## 2015-07-03 NOTE — Progress Notes (Signed)
Subjective:    Patient ID: Shane Grant, male    DOB: [birth year], 82 y.o.   MRN: [id number]  HPI Here for follow up of diabetes and other chronic health conditions  Doing well No new concerns Did have the dizziness with standing up This is gone since he stopped the lisinopril No chest pain No SOB No syncope No edema  Still will have leg pain after walking a short time Better after stopping or resting briefly Still tolerates the bike better  Feels his sugars are better than before Checking sugars fasting daily--- 80's -120 generally No hypoglycemic reactions Slight sensory change in feet--barely noticeable (like cut himself trimming nails and didn't notice)  Current Outpatient Prescriptions on File Prior to Visit  Medication Sig Dispense Refill  . aspirin 81 MG tablet Take 81 mg by mouth daily.      Marland Kitchen atorvastatin (LIPITOR) 80 MG tablet Take 1 tablet (80 mg total) by mouth daily. 90 tablet 0  . glipiZIDE (GLUCOTROL) 5 MG tablet TAKE 1 TABLET BY MOUTH TWICE A DAY BEFORE A MEAL 180 tablet 2  . Insulin Glargine (LANTUS SOLOSTAR) 100 UNIT/ML Solostar Pen Inject 10 Units into the skin daily. - Subcutaneous 15 mL 0  . Insulin Pen Needle (B-D ULTRAFINE III SHORT PEN) 31G X 8 MM MISC USE TO INJECT INSULIN DX: E11.40 100 each 3  . metFORMIN (GLUCOPHAGE) 1000 MG tablet Take 1 tablet (1,000 mg total) by mouth 2 (two) times daily with a meal. 180 tablet 0  . polyethylene glycol (MIRALAX / GLYCOLAX) packet Take 17 g by mouth daily as needed.     No current facility-administered medications on file prior to visit.    Allergies  Allergen Reactions  . Invokana [Canagliflozin]     Raised sugars and he felt bad  . Ancef [Cefazolin Sodium] Nausea Only    Past Medical History  Diagnosis Date  . Hypertension ~1997  . Hyperlipidemia   . Diabetes mellitus   . Colon cancer ([place]) 2003    Partial colectomy for cancer in polyp  . Kidney stones 2000    lithotripsy  . Urge incontinence  of urine     Past Surgical History  Procedure Laterality Date  . Tonsillectomy and adenoidectomy  1947  . Appendectomy  2003    with colectomy  . Partial colectomy  2003    ascending colon  . Hepatic lesion  2011    Cryoablation by radiologist  . Lithotripsy  2000  . Cataract extraction w/ intraocular lens  implant, bilateral Bilateral 11/14, 12/14    Dr George Ina    Family History  Problem Relation Age of Onset  . Heart disease Neg Hx   . Hypertension Neg Hx   . Diabetes Neg Hx   . Cancer Brother     lung    Social History   Social History  . Marital Status: Married    Spouse Name: N/A  . Number of Children: 3  . Years of Education: N/A   Occupational History  . Land    Retired   Social History Main Topics  . Smoking status: Former Smoker    Types: Cigars  . Smokeless tobacco: Never Used  . Alcohol Use: Yes     Comment: Glass of wine or scotch before dinner  . Drug Use: No  . Sexual Activity: Not on file   Other Topics Concern  . Not on file   Social History Narrative   3 sons all  married with 2 children   Has living will   Wife, then son Arva Chafe, is health care POA   Would accept resuscitation but no prolonged artificial ventilation   Would probably not want tube feedings   Review of Systems Ongoing incontinence --wears pad. Nocturia x 3 usually Sleeps fairly well despite that Appetite is good Weight is up slightly    Objective:   Physical Exam  Constitutional: He appears well-developed and well-nourished. No distress.  Neck: Normal range of motion. Neck supple. No thyromegaly present.  Cardiovascular: Normal rate and regular rhythm.  Exam reveals no gallop.   Faint pedal pulses Gr 2/6 systolic murmur at base to left sternal border and carotids  Pulmonary/Chest: Effort normal and breath sounds normal. No respiratory distress. He has no wheezes. He has no rales.  Musculoskeletal: He exhibits no edema.  Lymphadenopathy:      He has no cervical adenopathy.  Neurological:  Mildly decreased sensation in feet  Skin:  No foot lesions  Psychiatric: He has a normal mood and affect.          Assessment & Plan:

## 2015-07-03 NOTE — Assessment & Plan Note (Signed)
No sig stenosis but did have orthostasis (better off ACEI)

## 2015-07-03 NOTE — Assessment & Plan Note (Signed)
Seems to have good control Due for A1c Mild neuropathy doesn't need Rx

## 2015-07-03 NOTE — Progress Notes (Signed)
Pre visit review using our clinic review tool, if applicable. No additional management support is needed unless otherwise documented below in the visit note. 

## 2015-07-03 NOTE — Assessment & Plan Note (Signed)
Mild and stable No Rx needed

## 2015-07-03 NOTE — Assessment & Plan Note (Signed)
BP Readings from Last 3 Encounters:  07/03/15 118/76  04/14/15 118/60  01/01/15 110/60   Has been low Symptoms so lisinopril stopped

## 2015-07-16 ENCOUNTER — Other Ambulatory Visit: Payer: Self-pay

## 2015-07-16 MED ORDER — METFORMIN HCL 1000 MG PO TABS: 1000.0000 mg | ORAL_TABLET | Freq: Two times a day (BID) | ORAL | Status: DC

## 2015-07-16 NOTE — Telephone Encounter (Signed)
Rx sent electronically.  

## 2015-08-05 ENCOUNTER — Other Ambulatory Visit: Payer: Self-pay

## 2015-08-05 MED ORDER — ATORVASTATIN CALCIUM 80 MG PO TABS: 80.0000 mg | ORAL_TABLET | Freq: Every day | ORAL | Status: DC

## 2015-08-05 NOTE — Telephone Encounter (Signed)
Rx sent electronically.  

## 2015-08-24 ENCOUNTER — Other Ambulatory Visit: Payer: Self-pay

## 2015-08-24 MED ORDER — INSULIN GLARGINE 100 UNIT/ML SOLOSTAR PEN: PEN_INJECTOR | SUBCUTANEOUS | Status: DC

## 2015-08-24 NOTE — Telephone Encounter (Signed)
Rx sent electronically.  

## 2015-10-09 ENCOUNTER — Other Ambulatory Visit: Payer: Self-pay | Admitting: Internal Medicine

## 2015-11-24 ENCOUNTER — Ambulatory Visit (INDEPENDENT_AMBULATORY_CARE_PROVIDER_SITE_OTHER): Payer: Medicare Other

## 2015-11-24 DIAGNOSIS — Z23 Encounter for immunization: Secondary | ICD-10-CM | POA: Diagnosis not present

## 2016-01-06 ENCOUNTER — Other Ambulatory Visit: Payer: Self-pay | Admitting: Internal Medicine

## 2016-01-22 ENCOUNTER — Ambulatory Visit (INDEPENDENT_AMBULATORY_CARE_PROVIDER_SITE_OTHER): Payer: Medicare Other | Admitting: Internal Medicine

## 2016-01-22 ENCOUNTER — Encounter: Payer: Self-pay | Admitting: Internal Medicine

## 2016-01-22 ENCOUNTER — Other Ambulatory Visit: Payer: Self-pay

## 2016-01-22 VITALS — BP 122/84 | HR 96 | Temp 97.7°F | Ht 78.0 in | Wt 245.0 lb

## 2016-01-22 DIAGNOSIS — N3941 Urge incontinence: Secondary | ICD-10-CM

## 2016-01-22 DIAGNOSIS — Z7189 Other specified counseling: Secondary | ICD-10-CM

## 2016-01-22 DIAGNOSIS — I10 Essential (primary) hypertension: Secondary | ICD-10-CM

## 2016-01-22 DIAGNOSIS — Z0000 Encounter for general adult medical examination without abnormal findings: Secondary | ICD-10-CM

## 2016-01-22 DIAGNOSIS — I739 Peripheral vascular disease, unspecified: Secondary | ICD-10-CM

## 2016-01-22 DIAGNOSIS — I350 Nonrheumatic aortic (valve) stenosis: Secondary | ICD-10-CM

## 2016-01-22 DIAGNOSIS — E785 Hyperlipidemia, unspecified: Secondary | ICD-10-CM | POA: Diagnosis not present

## 2016-01-22 DIAGNOSIS — E1140 Type 2 diabetes mellitus with diabetic neuropathy, unspecified: Secondary | ICD-10-CM

## 2016-01-22 LAB — COMPREHENSIVE METABOLIC PANEL
ALT: 19 U/L (ref 0–53)
AST: 15 U/L (ref 0–37)
Albumin: 4.1 g/dL (ref 3.5–5.2)
Alkaline Phosphatase: 86 U/L (ref 39–117)
BILIRUBIN TOTAL: 0.6 mg/dL (ref 0.2–1.2)
BUN: 26 mg/dL — ABNORMAL HIGH (ref 6–23)
CO2: 29 meq/L (ref 19–32)
CREATININE: 1.05 mg/dL (ref 0.40–1.50)
Calcium: 10.2 mg/dL (ref 8.4–10.5)
Chloride: 107 mEq/L (ref 96–112)
GFR: 71.86 mL/min (ref >60.00)
GLUCOSE: 144 mg/dL — AB (ref 70–99)
Potassium: 4.8 mEq/L (ref 3.5–5.1)
SODIUM: 143 meq/L (ref 135–145)
Total Protein: 7.3 g/dL (ref 6.0–8.3)

## 2016-01-22 LAB — CBC WITH DIFFERENTIAL/PLATELET
BASOS ABS: 0.1 10*3/uL (ref 0.0–0.1)
Basophils Relative: 0.5 % (ref 0.0–3.0)
EOS ABS: 0.3 10*3/uL (ref 0.0–0.7)
Eosinophils Relative: 2.6 % (ref 0.0–5.0)
HCT: 40.1 % (ref 39.0–52.0)
Hemoglobin: 13.4 g/dL (ref 13.0–17.0)
LYMPHS ABS: 1.2 10*3/uL (ref 0.7–4.0)
Lymphocytes Relative: 10.7 % — ABNORMAL LOW (ref 12.0–46.0)
MCHC: 33.4 g/dL (ref 30.0–36.0)
MCV: 90.6 fl (ref 78.0–100.0)
MONOS PCT: 5.4 % (ref 3.0–12.0)
Monocytes Absolute: 0.6 10*3/uL (ref 0.1–1.0)
NEUTROS ABS: 9.2 10*3/uL — AB (ref 1.4–7.7)
NEUTROS PCT: 80.8 % — AB (ref 43.0–77.0)
PLATELETS: 277 10*3/uL (ref 150.0–400.0)
RBC: 4.43 Mil/uL (ref 4.22–5.81)
RDW: 13.6 % (ref 11.5–15.5)
WBC: 11.4 10*3/uL — ABNORMAL HIGH (ref 4.0–10.5)

## 2016-01-22 LAB — LIPID PANEL
Cholesterol: 163 mg/dL (ref 0–200)
HDL: 50.3 mg/dL (ref >39.00)
LDL Cholesterol: 94 mg/dL (ref 0–99)
NONHDL: 112.24
Total CHOL/HDL Ratio: 3
Triglycerides: 90 mg/dL (ref 0.0–149.0)
VLDL: 18 mg/dL (ref 0.0–40.0)

## 2016-01-22 LAB — HEMOGLOBIN A1C: Hgb A1c MFr Bld: 7 % — ABNORMAL HIGH (ref 4.6–6.5)

## 2016-01-22 MED ORDER — TETANUS-DIPHTHERIA TOXOIDS TD 5-2 LFU IM INJ: 0.5000 mL | INJECTION | Freq: Once | INTRAMUSCULAR | 0 refills | Status: AC

## 2016-01-22 NOTE — Assessment & Plan Note (Signed)
Still seems to have good control 

## 2016-01-22 NOTE — Assessment & Plan Note (Signed)
Okay for now Could consider mybetriq for daytime use, or imipramine for night if really worsens

## 2016-01-22 NOTE — Assessment & Plan Note (Signed)
See social history 

## 2016-01-22 NOTE — Assessment & Plan Note (Signed)
No problems with statin 

## 2016-01-22 NOTE — Assessment & Plan Note (Signed)
BP Readings from Last 3 Encounters:  01/22/16 122/84  07/03/15 118/76  04/14/15 118/60   Good control

## 2016-01-22 NOTE — Progress Notes (Signed)
Subjective:    Patient ID: Shane Grant, male    DOB: 1931/04/07, 82 y.o.   MRN: [id number]  HPI Here for Medicare wellness visit and follow up of chronic medical conditions Reviewed form and advanced directives Reviewed other doctors Did have 1 fall recently--tripped over curb. No injury Occasional drink of scotch or wine before dinner No tobacco Tries to exercise Vision is good Hearing is okay with aides No depression or anhedonia Independent with instrumental ADLs Mild memory issues--- no functional problems  Ongoing problems with walking Pain in legs--okay if just sitting or lying Needs cane for extended walking Still doing his regular exercise---but finds limitation with walking (like in a store) Mild deterioration in past year  No nerve pain in feet Some numbness Checks sugars every day-- slightly up from before (usually dietary) Now around 130 about half the time-- otherwise under 110 No hypoglycemia  Ongoing incontinence issues "I work around that" Wears protection and tries to void regularly Doesn't limit his social schedule Still seems to be limited to urge incontinence Gets up 4-5 times at night  No chest pain or SOB No dizziness or syncope Some balance issues No edema Current Outpatient Prescriptions on File Prior to Visit  Medication Sig Dispense Refill  . aspirin 81 MG tablet Take 81 mg by mouth daily.      Marland Kitchen atorvastatin (LIPITOR) 80 MG tablet Take 1 tablet (80 mg total) by mouth daily. 90 tablet 1  . glipiZIDE (GLUCOTROL) 5 MG tablet take 1 tablet by mouth twice a day BEFORE A MEAL 180 tablet 0  . Insulin Glargine (LANTUS SOLOSTAR) 100 UNIT/ML Solostar Pen Inject 10 Units into the skin daily. - Subcutaneous 15 mL 1  . Insulin Pen Needle (B-D ULTRAFINE III SHORT PEN) 31G X 8 MM MISC USE TO INJECT INSULIN DX: E11.40 100 each 3  . metFORMIN (GLUCOPHAGE) 1000 MG tablet Take 1 tablet (1,000 mg total) by mouth 2 (two) times daily with a meal. 180 tablet  3  . polyethylene glycol (MIRALAX / GLYCOLAX) packet Take 17 g by mouth daily as needed.     No current facility-administered medications on file prior to visit.     Allergies  Allergen Reactions  . Invokana [Canagliflozin]     Raised sugars and he felt bad  . Ancef [Cefazolin Sodium] Nausea Only    Past Medical History:  Diagnosis Date  . Colon cancer ([place]) 2003   Partial colectomy for cancer in polyp  . Diabetes mellitus   . Hyperlipidemia   . Hypertension ~1997  . Kidney stones 2000   lithotripsy  . Urge incontinence of urine     Past Surgical History:  Procedure Laterality Date  . APPENDECTOMY  2003   with colectomy  . CATARACT EXTRACTION W/ INTRAOCULAR LENS  IMPLANT, BILATERAL Bilateral 11/14, 12/14   Dr George Ina  . Hepatic lesion  2011   Cryoablation by radiologist  . LITHOTRIPSY  2000  . PARTIAL COLECTOMY  2003   ascending colon  . TONSILLECTOMY AND ADENOIDECTOMY  1947    Family History  Problem Relation Age of Onset  . Cancer Brother     lung  . Heart disease Neg Hx   . Hypertension Neg Hx   . Diabetes Neg Hx     Social History   Social History  . Marital status: Married    Spouse name: N/A  . Number of children: 3  . Years of education: N/A   Occupational History  . 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 Smoking status: Former Smoker    Types: Cigars  . Smokeless tobacco: Never Used  . Alcohol use Yes     Comment: Glass of wine or scotch before dinner  . Drug use: No  . Sexual activity: Not on file   Other Topics Concern  . Not on file   Social History Narrative   3 sons all married with 2 children   Has living will   Wife, then son Shane Grant, is health care POA   Would accept resuscitation but no prolonged artificial ventilation   Would probably not want tube feedings   Review of Systems Appetite is good Weight stable--has kept off the weight Sleeps okay despite the nocturia Wears seat  belt Teeth are okay--keeps up with dentist No heartburn or dysphagia Bowels are okay--- slightly irregular. No blood No rash or suspicious lesions    Objective:   Physical Exam  Constitutional: He is oriented to person, place, and time. He appears well-developed and well-nourished. No distress.  HENT:  Mouth/Throat: Oropharynx is clear and moist. No oropharyngeal exudate.  Neck: Normal range of motion. Neck supple. No thyromegaly present.  Cardiovascular: Normal rate, regular rhythm and intact distal pulses.  Exam reveals no gallop.   Gr 3/6 aortic systolic murmur  Pulmonary/Chest: Effort normal and breath sounds normal. No respiratory distress. He has no wheezes. He has no rales.  Abdominal: Soft. There is no tenderness.  Musculoskeletal: He exhibits no edema or tenderness.  Lymphadenopathy:    He has no cervical adenopathy.  Neurological: He is alert and oriented to person, place, and time.  President-- "Shane Grant, Bush" [phone] D-l-r-o-w Recall 2/3  Mild decreased sensation in feet  Skin: No rash noted. No erythema.  Psychiatric: He has a normal mood and affect. His behavior is normal.          Assessment & Plan:

## 2016-01-22 NOTE — Assessment & Plan Note (Signed)
Probably neurogenic Fairly stable

## 2016-01-22 NOTE — Assessment & Plan Note (Signed)
I have personally reviewed the Medicare Annual Wellness questionnaire and have noted 1. The patient's medical and social history 2. Their use of alcohol, tobacco or illicit drugs 3. Their current medications and supplements 4. The patient's functional ability including ADL's, fall risks, home safety risks and hearing or visual             impairment. 5. Diet and physical activities 6. Evidence for depression or mood disorders  The patients weight, height, BMI and visual acuity have been recorded in the chart I have made referrals, counseling and provided education to the patient based review of the above and I have provided the pt with a written personalized care plan for preventive services.  I have provided you with a copy of your personalized plan for preventive services. Please take the time to review along with your updated medication list.  Rx for Td No cancer screening due to age Discussed adding resistance exercise for muscle strength

## 2016-01-22 NOTE — Assessment & Plan Note (Signed)
No symptoms 

## 2016-01-27 ENCOUNTER — Other Ambulatory Visit: Payer: Self-pay | Admitting: Internal Medicine

## 2016-03-01 ENCOUNTER — Encounter: Payer: Self-pay | Admitting: Emergency Medicine

## 2016-03-01 ENCOUNTER — Emergency Department
Admission: EM | Admit: 2016-03-01 | Discharge: 2016-03-01 | Disposition: A | Discharge status: Home / Self Care | Payer: Medicare Other | Attending: Student in an Organized Health Care Education/Training Program | Admitting: Student in an Organized Health Care Education/Training Program

## 2016-03-01 DIAGNOSIS — I10 Essential (primary) hypertension: Secondary | ICD-10-CM | POA: Diagnosis not present

## 2016-03-01 DIAGNOSIS — W01198A Fall on same level from slipping, tripping and stumbling with subsequent striking against other object, initial encounter: Secondary | ICD-10-CM | POA: Insufficient documentation

## 2016-03-01 DIAGNOSIS — S0121XA Laceration without foreign body of nose, initial encounter: Secondary | ICD-10-CM | POA: Insufficient documentation

## 2016-03-01 DIAGNOSIS — S0181XA Laceration without foreign body of other part of head, initial encounter: Secondary | ICD-10-CM

## 2016-03-01 DIAGNOSIS — Y999 Unspecified external cause status: Secondary | ICD-10-CM | POA: Insufficient documentation

## 2016-03-01 DIAGNOSIS — Z794 Long term (current) use of insulin: Secondary | ICD-10-CM | POA: Insufficient documentation

## 2016-03-01 DIAGNOSIS — E119 Type 2 diabetes mellitus without complications: Secondary | ICD-10-CM | POA: Insufficient documentation

## 2016-03-01 DIAGNOSIS — Z7982 Long term (current) use of aspirin: Secondary | ICD-10-CM | POA: Insufficient documentation

## 2016-03-01 DIAGNOSIS — Z79899 Other long term (current) drug therapy: Secondary | ICD-10-CM | POA: Diagnosis not present

## 2016-03-01 DIAGNOSIS — Z85038 Personal history of other malignant neoplasm of large intestine: Secondary | ICD-10-CM | POA: Insufficient documentation

## 2016-03-01 DIAGNOSIS — Y929 Unspecified place or not applicable: Secondary | ICD-10-CM | POA: Insufficient documentation

## 2016-03-01 DIAGNOSIS — S01112A Laceration without foreign body of left eyelid and periocular area, initial encounter: Secondary | ICD-10-CM | POA: Insufficient documentation

## 2016-03-01 DIAGNOSIS — Y939 Activity, unspecified: Secondary | ICD-10-CM | POA: Diagnosis not present

## 2016-03-01 MED ORDER — BACITRACIN ZINC 500 UNIT/GM EX OINT
TOPICAL_OINTMENT | CUTANEOUS | Status: AC
  Filled 2016-03-01: qty 0.9

## 2016-03-01 MED ORDER — LIDOCAINE HCL (PF) 1 % IJ SOLN
INTRAMUSCULAR | Status: AC
  Filled 2016-03-01: qty 5

## 2016-03-01 NOTE — ED Provider Notes (Signed)
[facility] Emergency Department Provider Note   ____________________________________________   First MD Initiated Contact with Patient 03/01/16 1313     (approximate)  I have reviewed the triage vital signs and the nursing notes.   HISTORY  Chief Complaint Head Laceration    HPI Shane Grant is a 83 y.o. male who tripped and fell hitting his face on concrete. Patient sustained a laceration over the left eyebrow to the bridge of the nose. Bleeding was controlled. Patient denies loss of consciousness. Patient denies status post vertigo or vision disturbance. Patient does use 81 mg aspirin daily.He rates his pain as a 2/10. Patient showed her pain as "achy.   Past Medical History:  Diagnosis Date  . Colon cancer ([place]) 2003   Partial colectomy for cancer in polyp  . Diabetes mellitus   . Hyperlipidemia   . Hypertension ~1997  . Kidney stones 2000   lithotripsy  . Urge incontinence of urine     Patient Active Problem List   Diagnosis Date Noted  . Leg weakness, bilateral 04/14/2015  . Aortic stenosis 06/04/2014  . Advanced directives, counseling/discussion 12/04/2013  . Claudication ([place]) 04/17/2013  . Routine general medical examination at a health care facility 10/03/2011  . Hypertension   . Hyperlipidemia   . Type 2 diabetes, controlled, with neuropathy ([place])   . History of colon cancer   . Kidney stones   . Urge incontinence of urine     Past Surgical History:  Procedure Laterality Date  . APPENDECTOMY  2003   with colectomy  . CATARACT EXTRACTION W/ INTRAOCULAR LENS  IMPLANT, BILATERAL Bilateral 11/14, 12/14   Dr George Ina  . Hepatic lesion  2011   Cryoablation by radiologist  . LITHOTRIPSY  2000  . PARTIAL COLECTOMY  2003   ascending colon  . TONSILLECTOMY AND ADENOIDECTOMY  1947    Prior to Admission medications   Medication Sig Start Date End Date Taking? Authorizing Provider  aspirin 81 MG tablet Take 81 mg by mouth  daily.      Historical Provider, MD  atorvastatin (LIPITOR) 80 MG tablet take 1 tablet by mouth once daily 01/27/16   Venia Carbon, MD  glipiZIDE (GLUCOTROL) 5 MG tablet take 1 tablet by mouth twice a day BEFORE A MEAL 01/06/16   Venia Carbon, MD  Insulin Glargine (LANTUS SOLOSTAR) 100 UNIT/ML Solostar Pen Inject 10 Units into the skin daily. - Subcutaneous 08/24/15   Venia Carbon, MD  Insulin Pen Needle (B-D ULTRAFINE III SHORT PEN) 31G X 8 MM MISC USE TO INJECT INSULIN DX: E11.40 06/04/15   Venia Carbon, MD  metFORMIN (GLUCOPHAGE) 1000 MG tablet Take 1 tablet (1,000 mg total) by mouth 2 (two) times daily with a meal. 07/16/15   Venia Carbon, MD  polyethylene glycol (MIRALAX / GLYCOLAX) packet Take 17 g by mouth daily as needed.    Historical Provider, MD    Allergies Invokana [canagliflozin] and Ancef [cefazolin sodium]  Family History  Problem Relation Age of Onset  . Cancer Brother     lung  . Heart disease Neg Hx   . Hypertension Neg Hx   . Diabetes Neg Hx     Social History Social History  Substance Use Topics  . Smoking status: Former Smoker    Types: Cigars  . Smokeless tobacco: Never Used  . Alcohol use Yes     Comment: Glass of wine or scotch before dinner    Review of Systems Constitutional:  No fever/chills Eyes: No visual changes. ENT: No sore throat. Cardiovascular: Denies chest pain. Respiratory: Denies shortness of breath. Gastrointestinal: No abdominal pain.  No nausea, no vomiting.  No diarrhea.  No constipation. Genitourinary: Negative for dysuria. Musculoskeletal: Negative for back pain. Skin: Negative for rash. Neurological: Negative for headaches, focal weakness or numbness. Endocrine:Hypertension, hyperlipidemia, and diabetes. Hematological/Lymphatic: Allergic/Immunilogical: See medication list  ____________________________________________   PHYSICAL EXAM:  VITAL SIGNS: ED Triage Vitals  Enc Vitals Group     BP 03/01/16  1259 121/75     Pulse Rate 03/01/16 1259 97     Resp 03/01/16 1259 20     Temp 03/01/16 1259 98.2 F (36.8 C)     Temp Source 03/01/16 1259 Oral     SpO2 03/01/16 1259 96 %     Weight 03/01/16 1300 242 lb (109.8 kg)     Height 03/01/16 1300 6\' 6"  (1.981 m)     Head Circumference --      Peak Flow --      Pain Score --      Pain Loc --      Pain Edu? --      Excl. in [place]? --     Constitutional: Alert and oriented. Well appearing and in no acute distress. Eyes: Conjunctivae are normal. PERRL. EOMI. Head: Atraumatic. Nose: No congestion/rhinnorhea. Mouth/Throat: Mucous membranes are moist.  Oropharynx non-erythematous. Neck: No stridor.  No cervical spine tenderness to palpation. Hematological/Lymphatic/Immunilogical: No cervical lymphadenopathy. Cardiovascular: Normal rate, regular rhythm. Grossly normal heart sounds.  Good peripheral circulation. Respiratory: Normal respiratory effort.  No retractions. Lungs CTAB. Gastrointestinal: Soft and nontender. No distention. No abdominal bruits. No CVA tenderness. Musculoskeletal: No lower extremity tenderness nor edema.  No joint effusions. Neurologic:  Normal speech and language. No gross focal neurologic deficits are appreciated. No gait instability. Skin:  Skin is warm, dry and intact. No rash noted.Laceration approximately 2 cm left supraorbital area and nasal laceration approximately 0.5 cm anterior nose. Psychiatric: Mood and affect are normal. Speech and behavior are normal.  ____________________________________________   LABS (all labs ordered are listed, but only abnormal results are displayed)  Labs Reviewed - No data to display ____________________________________________  EKG   ____________________________________________  RADIOLOGY   ____________________________________________   PROCEDURES  Procedure(s) performed: LACERATION REPAIR Performed by: Sable Feil Authorized by: Sable Feil Consent: Verbal  consent obtained. Risks and benefits: risks, benefits and alternatives were discussed Consent given by: patient Patient identity confirmed: provided demographic data Prepped and Draped in normal sterile fashion Wound explored  Laceration Location: Left supraorbital area and anterior nose  Laceration Length: 2cm left supraorbital area 0.5 cm anterior nose  No Foreign Bodies seen or palpated  Anesthesia: local infiltration  Local anesthetic: lidocaine 1% with epinephrine  Anesthetic total: 4 ml  Irrigation method: syringe Amount of cleaning: standard  Skin closure: 6-0 nylon and Dermabond Number of sutures: 8 Technique: Simple interrupted  Patient tolerance: Patient tolerated the procedure well with no immediate complications.   Procedures  Critical Care performed: No  ____________________________________________   INITIAL IMPRESSION / ASSESSMENT AND PLAN / ED COURSE  Pertinent labs & imaging results that were available during my care of the patient were reviewed by me and considered in my medical decision making (see chart for details).  Facial laceration involving the left supraorbital area and anterior nose. Patient given discharge care instructions. Patient advised return by ER if wound reopened before healing is complete.  Clinical Course  ____________________________________________   FINAL CLINICAL IMPRESSION(S) / ED DIAGNOSES  Final diagnoses:  Facial laceration, initial encounter      NEW MEDICATIONS STARTED DURING THIS VISIT:  Discharge Medication List as of 03/01/2016  2:01 PM       Note:  This document was prepared using Dragon voice recognition software and may include unintentional dictation errors.    Sable Feil, PA-C 03/01/16 [place], MD 03/01/16 [phone]

## 2016-03-01 NOTE — ED Notes (Signed)
See triage note  [address] forward  Laceration noted to forehead and bridge of nose

## 2016-03-01 NOTE — ED Triage Notes (Signed)
Pt here with laceration to forehead and bridge of nose; bleeding controlled at this time. Pt reports trip and fall, hit face on cement wall. Pt denies LOC, denies headache or pain at present. Pt ambulatory to triage with cane, A/O. Pt reports use of 81mg  aspirin daily.

## 2016-03-04 DIAGNOSIS — E119 Type 2 diabetes mellitus without complications: Secondary | ICD-10-CM | POA: Diagnosis not present

## 2016-03-04 LAB — HM DIABETES EYE EXAM

## 2016-03-07 ENCOUNTER — Telehealth: Payer: Self-pay

## 2016-03-07 NOTE — Telephone Encounter (Signed)
Spoke to pt. He said he is doing well. He needs to get his stitches out 03-09-16. Was asking if we could send an order to [facility] to have the stitches removed

## 2016-03-08 ENCOUNTER — Encounter: Payer: Self-pay | Admitting: Internal Medicine

## 2016-03-08 NOTE — Telephone Encounter (Signed)
Left a message on the Independent Nurse Line at [facility] to remove his stitches tomorrow or later. Spoke to pt. He will call them tomorrow morning to see what is convenient for them.

## 2016-03-08 NOTE — Telephone Encounter (Signed)
Please call the independent nurses at [facility] and give them permission to remove the stitches after 7-10 days--or sooner if indicated

## 2016-04-04 ENCOUNTER — Other Ambulatory Visit: Payer: Self-pay | Admitting: Internal Medicine

## 2016-04-27 ENCOUNTER — Other Ambulatory Visit: Payer: Self-pay | Admitting: Internal Medicine

## 2016-05-24 DIAGNOSIS — R2681 Unsteadiness on feet: Secondary | ICD-10-CM | POA: Diagnosis not present

## 2016-05-24 DIAGNOSIS — M6281 Muscle weakness (generalized): Secondary | ICD-10-CM | POA: Diagnosis not present

## 2016-05-25 DIAGNOSIS — M6281 Muscle weakness (generalized): Secondary | ICD-10-CM | POA: Diagnosis not present

## 2016-05-25 DIAGNOSIS — R2681 Unsteadiness on feet: Secondary | ICD-10-CM | POA: Diagnosis not present

## 2016-05-27 DIAGNOSIS — M6281 Muscle weakness (generalized): Secondary | ICD-10-CM | POA: Diagnosis not present

## 2016-05-27 DIAGNOSIS — R2681 Unsteadiness on feet: Secondary | ICD-10-CM | POA: Diagnosis not present

## 2016-05-30 DIAGNOSIS — R2681 Unsteadiness on feet: Secondary | ICD-10-CM | POA: Diagnosis not present

## 2016-05-30 DIAGNOSIS — M6281 Muscle weakness (generalized): Secondary | ICD-10-CM | POA: Diagnosis not present

## 2016-06-01 DIAGNOSIS — R2681 Unsteadiness on feet: Secondary | ICD-10-CM | POA: Diagnosis not present

## 2016-06-01 DIAGNOSIS — M6281 Muscle weakness (generalized): Secondary | ICD-10-CM | POA: Diagnosis not present

## 2016-06-03 DIAGNOSIS — M6281 Muscle weakness (generalized): Secondary | ICD-10-CM | POA: Diagnosis not present

## 2016-06-03 DIAGNOSIS — R2681 Unsteadiness on feet: Secondary | ICD-10-CM | POA: Diagnosis not present

## 2016-06-06 DIAGNOSIS — M6281 Muscle weakness (generalized): Secondary | ICD-10-CM | POA: Diagnosis not present

## 2016-06-06 DIAGNOSIS — R2681 Unsteadiness on feet: Secondary | ICD-10-CM | POA: Diagnosis not present

## 2016-06-08 DIAGNOSIS — R2681 Unsteadiness on feet: Secondary | ICD-10-CM | POA: Diagnosis not present

## 2016-06-08 DIAGNOSIS — M6281 Muscle weakness (generalized): Secondary | ICD-10-CM | POA: Diagnosis not present

## 2016-06-10 DIAGNOSIS — M6281 Muscle weakness (generalized): Secondary | ICD-10-CM | POA: Diagnosis not present

## 2016-06-10 DIAGNOSIS — R2681 Unsteadiness on feet: Secondary | ICD-10-CM | POA: Diagnosis not present

## 2016-06-13 DIAGNOSIS — M6281 Muscle weakness (generalized): Secondary | ICD-10-CM | POA: Diagnosis not present

## 2016-06-13 DIAGNOSIS — R2681 Unsteadiness on feet: Secondary | ICD-10-CM | POA: Diagnosis not present

## 2016-06-15 DIAGNOSIS — R2681 Unsteadiness on feet: Secondary | ICD-10-CM | POA: Diagnosis not present

## 2016-06-17 DIAGNOSIS — R2681 Unsteadiness on feet: Secondary | ICD-10-CM | POA: Diagnosis not present

## 2016-06-20 DIAGNOSIS — R2681 Unsteadiness on feet: Secondary | ICD-10-CM | POA: Diagnosis not present

## 2016-06-22 DIAGNOSIS — R2681 Unsteadiness on feet: Secondary | ICD-10-CM | POA: Diagnosis not present

## 2016-06-24 DIAGNOSIS — R2681 Unsteadiness on feet: Secondary | ICD-10-CM | POA: Diagnosis not present

## 2016-06-27 DIAGNOSIS — R2681 Unsteadiness on feet: Secondary | ICD-10-CM | POA: Diagnosis not present

## 2016-06-29 DIAGNOSIS — R2681 Unsteadiness on feet: Secondary | ICD-10-CM | POA: Diagnosis not present

## 2016-07-04 DIAGNOSIS — R2681 Unsteadiness on feet: Secondary | ICD-10-CM | POA: Diagnosis not present

## 2016-07-06 ENCOUNTER — Other Ambulatory Visit: Payer: Self-pay | Admitting: Internal Medicine

## 2016-07-06 DIAGNOSIS — R2681 Unsteadiness on feet: Secondary | ICD-10-CM | POA: Diagnosis not present

## 2016-07-08 DIAGNOSIS — R2681 Unsteadiness on feet: Secondary | ICD-10-CM | POA: Diagnosis not present

## 2016-07-12 ENCOUNTER — Other Ambulatory Visit: Payer: Self-pay | Admitting: Internal Medicine

## 2016-07-13 DIAGNOSIS — R2681 Unsteadiness on feet: Secondary | ICD-10-CM | POA: Diagnosis not present

## 2016-07-15 DIAGNOSIS — R2681 Unsteadiness on feet: Secondary | ICD-10-CM | POA: Diagnosis not present

## 2016-07-18 DIAGNOSIS — R2681 Unsteadiness on feet: Secondary | ICD-10-CM | POA: Diagnosis not present

## 2016-07-20 DIAGNOSIS — R2681 Unsteadiness on feet: Secondary | ICD-10-CM | POA: Diagnosis not present

## 2016-07-22 ENCOUNTER — Encounter: Payer: Self-pay | Admitting: Internal Medicine

## 2016-07-22 ENCOUNTER — Ambulatory Visit (INDEPENDENT_AMBULATORY_CARE_PROVIDER_SITE_OTHER): Payer: Medicare Other | Admitting: Internal Medicine

## 2016-07-22 VITALS — BP 118/82 | HR 94 | Temp 98.0°F | Wt 242.0 lb

## 2016-07-22 DIAGNOSIS — I739 Peripheral vascular disease, unspecified: Secondary | ICD-10-CM

## 2016-07-22 DIAGNOSIS — Z23 Encounter for immunization: Secondary | ICD-10-CM

## 2016-07-22 DIAGNOSIS — E1140 Type 2 diabetes mellitus with diabetic neuropathy, unspecified: Secondary | ICD-10-CM | POA: Diagnosis not present

## 2016-07-22 DIAGNOSIS — I350 Nonrheumatic aortic (valve) stenosis: Secondary | ICD-10-CM | POA: Diagnosis not present

## 2016-07-22 DIAGNOSIS — I10 Essential (primary) hypertension: Secondary | ICD-10-CM | POA: Diagnosis not present

## 2016-07-22 LAB — MICROALBUMIN / CREATININE URINE RATIO
Creatinine,U: 98 mg/dL
MICROALB UR: 6.8 mg/dL — AB (ref 0.0–1.9)
Microalb Creat Ratio: 6.9 mg/g (ref 0.0–30.0)

## 2016-07-22 LAB — HEMOGLOBIN A1C: Hgb A1c MFr Bld: 7 % — ABNORMAL HIGH (ref 4.6–6.5)

## 2016-07-22 LAB — HM DIABETES FOOT EXAM

## 2016-07-22 NOTE — Assessment & Plan Note (Signed)
BP Readings from Last 3 Encounters:  07/22/16 118/82  03/01/16 121/75  01/22/16 122/84   Consistently good control without meds

## 2016-07-22 NOTE — Assessment & Plan Note (Signed)
Stable May be combination of neurologic and mild vascular?? No Rx unless changes

## 2016-07-22 NOTE — Assessment & Plan Note (Signed)
No symptoms ??flow component

## 2016-07-22 NOTE — Addendum Note (Signed)
Addended by: Beatriz Stallion on: 07/22/2016 08:16 AM   Modules accepted: Orders

## 2016-07-22 NOTE — Progress Notes (Signed)
Subjective:    Patient ID: Shane Grant, male    DOB: 11/02/1931, 83 y.o.   MRN: [id number]  HPI Here for follow up of diabetes and other chronic health conditions  He notes no new concerns Still check sugars daily-- range 85-150. Mostly 100-130 No hypoglycemic reactions Some numbness in feet--no pain  Still has leg pain Right more than left will hurt with walking Uses cane for walking much of the time Better if he rests  Ongoing incontinence--"I work around it" Wears pad and tries to keep bladder empty Doesn't limit him socially Not much trouble in the day--more issues at night  No chest pain No SOB No dizziness or syncope.  May have some balance issues---and has done some PT that has helped  Current Outpatient Prescriptions on File Prior to Visit  Medication Sig Dispense Refill  . aspirin 81 MG tablet Take 81 mg by mouth daily.      Marland Kitchen atorvastatin (LIPITOR) 80 MG tablet take 1 tablet by mouth once daily 90 tablet 3  . B-D ULTRAFINE III SHORT PEN 31G X 8 MM MISC use as directed 100 each 3  . glipiZIDE (GLUCOTROL) 5 MG tablet take 1 tablet by mouth twice a day before meals 180 tablet 3  . LANTUS SOLOSTAR 100 UNIT/ML Solostar Pen inject 10 units INTO THE SKIN daily 15 mL 1  . metFORMIN (GLUCOPHAGE) 1000 MG tablet take 1 tablet by mouth twice a day WITH A MEAL 180 tablet 0  . polyethylene glycol (MIRALAX / GLYCOLAX) packet Take 17 g by mouth daily as needed.     No current facility-administered medications on file prior to visit.     Allergies  Allergen Reactions  . Invokana [Canagliflozin]     Raised sugars and he felt bad  . Ancef [Cefazolin Sodium] Nausea Only    Past Medical History:  Diagnosis Date  . Colon cancer ([place]) 2003   Partial colectomy for cancer in polyp  . Diabetes mellitus   . Hyperlipidemia   . Hypertension ~1997  . Kidney stones 2000   lithotripsy  . Urge incontinence of urine     Past Surgical History:  Procedure Laterality Date  .  APPENDECTOMY  2003   with colectomy  . CATARACT EXTRACTION W/ INTRAOCULAR LENS  IMPLANT, BILATERAL Bilateral 11/14, 12/14   Dr George Ina  . Hepatic lesion  2011   Cryoablation by radiologist  . LITHOTRIPSY  2000  . PARTIAL COLECTOMY  2003   ascending colon  . TONSILLECTOMY AND ADENOIDECTOMY  1947    Family History  Problem Relation Age of Onset  . Cancer Brother        lung  . Heart disease Neg Hx   . Hypertension Neg Hx   . Diabetes Neg Hx     Social History   Social History  . Marital status: Married    Spouse name: N/A  . Number of children: 3  . Years of education: N/A   Occupational History  . Land    Retired   Social History Main Topics  . Smoking status: Former Smoker    Types: Cigars  . Smokeless tobacco: Never Used  . Alcohol use Yes     Comment: Glass of wine or scotch before dinner  . Drug use: No  . Sexual activity: Not on file   Other Topics Concern  . Not on file   Social History Narrative   3 sons all married with 2 children  Has living will   Wife, then son Arva Chafe, is health care POA   Would accept resuscitation but no prolonged artificial ventilation   Would probably not want tube feedings   Review of Systems Sleeps well--despite the frequent nocturia Appetite is fine Weight down 3#    Objective:   Physical Exam  Constitutional: He appears well-developed and well-nourished. No distress.  Neck: No thyromegaly present.  Cardiovascular: Normal rate and regular rhythm.  Exam reveals no gallop.   Faint pedal pulses Gr 3/6 aortic systolic murmur ---heard at base also  Pulmonary/Chest: Effort normal and breath sounds normal. No respiratory distress. He has no wheezes. He has no rales.  Musculoskeletal: He exhibits no tenderness.  ?trace edema at ankles  Lymphadenopathy:    He has no cervical adenopathy.  Neurological:  Mildly decreased sensation in feet  Skin:  No foot lesions  Psychiatric: He has a normal  mood and affect. His behavior is normal.          Assessment & Plan:

## 2016-07-22 NOTE — Assessment & Plan Note (Signed)
Still seems to have good control No pain in feet--so no Rx

## 2016-08-05 ENCOUNTER — Encounter: Payer: Self-pay | Admitting: Internal Medicine

## 2016-08-10 ENCOUNTER — Telehealth: Payer: Self-pay | Admitting: Internal Medicine

## 2016-08-10 NOTE — Telephone Encounter (Signed)
I spoke with Shane Grant; for 1 1/2 wk Shane Grant having dark black looser BMs (not diarrhea) and having smaller amt of BM recently. No abd pain, or fever. Last normal BM 1 wk ago. Shane Grant has not seen any bright red blood with stools. Shane Grant has not taken Pepto Bismol. Shane Grant said he has no other symptoms than the black stools and Shane Grant does not want to go to UC or ED. Shane Grant wants to see Dr Silvio Pate; Shane Grant scheduled appt with Dr Silvio Pate 08/11/16 at 4:30. If Shane Grant condition changes or worsens prior to appt Shane Grant will go to [facility] or ED.

## 2016-08-10 NOTE — Telephone Encounter (Signed)
Patient Name: Shane Grant  DOB: 1931-07-18    Initial Comment Caller states he is having black stools.    Nurse Assessment  Nurse: Christel Mormon, RN, Levada Dy Date/Time (Eastern Time): 08/10/2016 3:59:53 PM  Confirm and document reason for call. If symptomatic, describe symptoms. ---Caller states he has had black stool for a week and half.  Does the patient have any new or worsening symptoms? ---Yes  Will a triage be completed? ---Yes  Related visit to physician within the last 2 weeks? ---No  Does the PT have any chronic conditions? (i.e. diabetes, asthma, etc.) ---Yes  List chronic conditions. ---diabetes, HTN,  Is this a behavioral health or substance abuse call? ---No     Guidelines    Guideline Title Affirmed Question Affirmed Notes  Rectal Bleeding Tarry or jet black-colored stool (not dark green)    Final Disposition User   Go to ED Now [place], RN, Levada Dy    Comments  Outcome ER- Refused- "what are they going to do for me?"  Spoke with Rena in the office-   Referrals  GO TO FACILITY REFUSED  [place] REFUSED   Disagree/Comply: Disagree  Disagree/Comply Reason: Disagree with instructions

## 2016-08-11 ENCOUNTER — Ambulatory Visit (INDEPENDENT_AMBULATORY_CARE_PROVIDER_SITE_OTHER): Payer: Medicare Other | Admitting: Internal Medicine

## 2016-08-11 ENCOUNTER — Encounter: Payer: Self-pay | Admitting: Internal Medicine

## 2016-08-11 VITALS — BP 124/80 | HR 94 | Temp 97.8°F | Wt 241.0 lb

## 2016-08-11 DIAGNOSIS — K921 Melena: Secondary | ICD-10-CM | POA: Diagnosis not present

## 2016-08-11 NOTE — Patient Instructions (Signed)
NO IMODIUM.  Please start the miralax with a full capful in a large glass of water twice a day. If you have done this for 3 days, and not clearing out, you can try milk of magnesia or an enema. Once you are cleared out, please continue the miralax once daly

## 2016-08-11 NOTE — Telephone Encounter (Signed)
Okay  Will evaluate then and hemoccult stool

## 2016-08-11 NOTE — Assessment & Plan Note (Signed)
Fortunately not blood Discussed NO IMODIUM Use the miralax regularly---instructions given

## 2016-08-11 NOTE — Progress Notes (Signed)
Subjective:    Patient ID: Shane Grant, male    DOB: 12/31/1931, 83 y.o.   MRN: [id number]  HPI Here due to black stools--with wife Tamela Oddi  Stools are very black-- started about 10-14 days ago Digestive system is "all screwed up" Diarrhea and constipation at times Imodium helps if he is loose miralax and MOM as needed when constipated No abdominal pain  No pepto bismol No blood  Current Outpatient Prescriptions on File Prior to Visit  Medication Sig Dispense Refill  . aspirin 81 MG tablet Take 81 mg by mouth daily.      Marland Kitchen atorvastatin (LIPITOR) 80 MG tablet take 1 tablet by mouth once daily 90 tablet 3  . B-D ULTRAFINE III SHORT PEN 31G X 8 MM MISC use as directed 100 each 3  . glipiZIDE (GLUCOTROL) 5 MG tablet take 1 tablet by mouth twice a day before meals 180 tablet 3  . LANTUS SOLOSTAR 100 UNIT/ML Solostar Pen inject 10 units INTO THE SKIN daily 15 mL 1  . metFORMIN (GLUCOPHAGE) 1000 MG tablet take 1 tablet by mouth twice a day WITH A MEAL 180 tablet 0  . polyethylene glycol (MIRALAX / GLYCOLAX) packet Take 17 g by mouth daily as needed.     No current facility-administered medications on file prior to visit.     Allergies  Allergen Reactions  . Invokana [Canagliflozin]     Raised sugars and he felt bad  . Ancef [Cefazolin Sodium] Nausea Only    Past Medical History:  Diagnosis Date  . Colon cancer ([place]) 2003   Partial colectomy for cancer in polyp  . Diabetes mellitus   . Hyperlipidemia   . Hypertension ~1997  . Kidney stones 2000   lithotripsy  . Urge incontinence of urine     Past Surgical History:  Procedure Laterality Date  . APPENDECTOMY  2003   with colectomy  . CATARACT EXTRACTION W/ INTRAOCULAR LENS  IMPLANT, BILATERAL Bilateral 11/14, 12/14   Dr George Ina  . Hepatic lesion  2011   Cryoablation by radiologist  . LITHOTRIPSY  2000  . PARTIAL COLECTOMY  2003   ascending colon  . TONSILLECTOMY AND ADENOIDECTOMY  1947    Family History    Problem Relation Age of Onset  . Cancer Brother        lung  . Heart disease Neg Hx   . Hypertension Neg Hx   . Diabetes Neg Hx     Social History   Social History  . Marital status: Married    Spouse name: N/A  . Number of children: 3  . Years of education: N/A   Occupational History  . Land    Retired   Social History Main Topics  . Smoking status: Former Smoker    Types: Cigars  . Smokeless tobacco: Never Used  . Alcohol use Yes     Comment: Glass of wine or scotch before dinner  . Drug use: No  . Sexual activity: Not on file   Other Topics Concern  . Not on file   Social History Narrative   3 sons all married with 2 children   Has living will   Wife, then son Arva Chafe, is health care POA   Would accept resuscitation but no prolonged artificial ventilation   Would probably not want tube feedings   Review of Systems  Appetite is fine No change in diet or supplements Weight is stable Appetite is fine No N/V  Objective:   Physical Exam  Abdominal: Soft. Bowel sounds are normal. He exhibits no distension and no mass. There is no tenderness. There is no rebound and no guarding.  Genitourinary:  Genitourinary Comments: Stool outside of rectum and on gauze he has there. Dark but not melanotic Large mass of stool fills the entire rectum Heme negative          Assessment & Plan:

## 2016-09-30 ENCOUNTER — Other Ambulatory Visit: Payer: Self-pay | Admitting: Internal Medicine

## 2016-10-31 ENCOUNTER — Encounter: Payer: Self-pay | Admitting: Internal Medicine

## 2016-12-08 ENCOUNTER — Ambulatory Visit (INDEPENDENT_AMBULATORY_CARE_PROVIDER_SITE_OTHER): Payer: Medicare Other

## 2016-12-08 DIAGNOSIS — Z23 Encounter for immunization: Secondary | ICD-10-CM

## 2017-01-02 ENCOUNTER — Other Ambulatory Visit: Payer: Self-pay | Admitting: Internal Medicine

## 2017-01-21 ENCOUNTER — Other Ambulatory Visit: Payer: Self-pay | Admitting: Internal Medicine

## 2017-01-23 ENCOUNTER — Encounter: Payer: Self-pay | Admitting: Internal Medicine

## 2017-01-27 ENCOUNTER — Encounter: Payer: Self-pay | Admitting: Internal Medicine

## 2017-01-27 ENCOUNTER — Ambulatory Visit (INDEPENDENT_AMBULATORY_CARE_PROVIDER_SITE_OTHER): Payer: Medicare Other | Admitting: Internal Medicine

## 2017-01-27 VITALS — BP 110/70 | HR 77 | Temp 98.0°F | Ht 78.0 in | Wt 241.0 lb

## 2017-01-27 DIAGNOSIS — E1140 Type 2 diabetes mellitus with diabetic neuropathy, unspecified: Secondary | ICD-10-CM

## 2017-01-27 DIAGNOSIS — Z0001 Encounter for general adult medical examination with abnormal findings: Secondary | ICD-10-CM | POA: Diagnosis not present

## 2017-01-27 DIAGNOSIS — Z7189 Other specified counseling: Secondary | ICD-10-CM

## 2017-01-27 DIAGNOSIS — I10 Essential (primary) hypertension: Secondary | ICD-10-CM

## 2017-01-27 DIAGNOSIS — I739 Peripheral vascular disease, unspecified: Secondary | ICD-10-CM

## 2017-01-27 DIAGNOSIS — I350 Nonrheumatic aortic (valve) stenosis: Secondary | ICD-10-CM

## 2017-01-27 DIAGNOSIS — N3941 Urge incontinence: Secondary | ICD-10-CM

## 2017-01-27 DIAGNOSIS — Z0000 Encounter for general adult medical examination without abnormal findings: Secondary | ICD-10-CM

## 2017-01-27 LAB — COMPREHENSIVE METABOLIC PANEL
ALBUMIN: 4.1 g/dL (ref 3.5–5.2)
ALK PHOS: 86 U/L (ref 39–117)
ALT: 15 U/L (ref 0–53)
AST: 14 U/L (ref 0–37)
BUN: 23 mg/dL (ref 6–23)
CALCIUM: 9.9 mg/dL (ref 8.4–10.5)
CO2: 29 mEq/L (ref 19–32)
CREATININE: 1.14 mg/dL (ref 0.40–1.50)
Chloride: 103 mEq/L (ref 96–112)
GFR: 65.19 mL/min (ref >60.00)
Glucose, Bld: 120 mg/dL — ABNORMAL HIGH (ref 70–99)
POTASSIUM: 4.5 meq/L (ref 3.5–5.1)
SODIUM: 139 meq/L (ref 135–145)
TOTAL PROTEIN: 7.1 g/dL (ref 6.0–8.3)
Total Bilirubin: 0.8 mg/dL (ref 0.2–1.2)

## 2017-01-27 LAB — CBC
HEMATOCRIT: 39.6 % (ref 39.0–52.0)
HEMOGLOBIN: 13.2 g/dL (ref 13.0–17.0)
MCHC: 33.4 g/dL (ref 30.0–36.0)
MCV: 90.8 fl (ref 78.0–100.0)
PLATELETS: 282 10*3/uL (ref 150.0–400.0)
RBC: 4.36 Mil/uL (ref 4.22–5.81)
RDW: 13.1 % (ref 11.5–15.5)
WBC: 10 10*3/uL (ref 4.0–10.5)

## 2017-01-27 LAB — LIPID PANEL
CHOLESTEROL: 136 mg/dL (ref 0–200)
HDL: 46.1 mg/dL (ref >39.00)
LDL Cholesterol: 64 mg/dL (ref 0–99)
NonHDL: 90.16
TRIGLYCERIDES: 132 mg/dL (ref 0.0–149.0)
Total CHOL/HDL Ratio: 3
VLDL: 26.4 mg/dL (ref 0.0–40.0)

## 2017-01-27 LAB — HEMOGLOBIN A1C: Hgb A1c MFr Bld: 7.3 % — ABNORMAL HIGH (ref 4.6–6.5)

## 2017-01-27 NOTE — Assessment & Plan Note (Signed)
Troubling but no easy answers

## 2017-01-27 NOTE — Assessment & Plan Note (Signed)
Occasional orthostatic dizziness but no major symptoms

## 2017-01-27 NOTE — Progress Notes (Signed)
Subjective:    Patient ID: Shane Grant, male    DOB: Oct 13, 1931, 83 y.o.   MRN: [id number]  HPI Here for Medicare wellness visit and follow up of chronic health conditions Reviewed form and advanced directives Reviewed other doctors Vision okay Wears hearing aides [address] once this year with injury--tripped on cement pathway. Forehead laceration (sutures in ER). A couple of other minor falls Does go to gym---stationery bike. Discussed doing resistance training as well 1-2 scotch or wine before dinner No tobacco Independent with instrumental ADLs Mild memory issues--no functional change No depression or anhedonia  Ongoing urinary incontinence Has to wear pad No dysuria or hematuria  Still has pain in legs Limited in walking distance---uses cane Rests and it gets better No problem at rest  Checks sugars daily Some variability--- 100-130 is typical No hypoglycemic spells No change in feet sensation  No chest pain No SOB Occasional dizziness after getting up---better after sitting down No syncope  Current Outpatient Medications on File Prior to Visit  Medication Sig Dispense Refill  . aspirin 81 MG tablet Take 81 mg by mouth daily.      Marland Kitchen atorvastatin (LIPITOR) 80 MG tablet take 1 tablet by mouth once daily 90 tablet 0  . B-D ULTRAFINE III SHORT PEN 31G X 8 MM MISC use as directed 100 each 3  . glipiZIDE (GLUCOTROL) 5 MG tablet take 1 tablet by mouth twice a day before meals 180 tablet 3  . LANTUS SOLOSTAR 100 UNIT/ML Solostar Pen inject 10 units subcutaneously daily 15 mL 1  . metFORMIN (GLUCOPHAGE) 1000 MG tablet take 1 tablet by mouth twice a day WITH A MEAL 180 tablet 3   No current facility-administered medications on file prior to visit.     Allergies  Allergen Reactions  . Invokana [Canagliflozin]     Raised sugars and he felt bad  . Ancef [Cefazolin Sodium] Nausea Only    Past Medical History:  Diagnosis Date  . Colon cancer ([place]) 2003   Partial  colectomy for cancer in polyp  . Diabetes mellitus   . Hyperlipidemia   . Hypertension ~1997  . Kidney stones 2000   lithotripsy  . Urge incontinence of urine     Past Surgical History:  Procedure Laterality Date  . APPENDECTOMY  2003   with colectomy  . CATARACT EXTRACTION W/ INTRAOCULAR LENS  IMPLANT, BILATERAL Bilateral 11/14, 12/14   Dr George Ina  . Hepatic lesion  2011   Cryoablation by radiologist  . LITHOTRIPSY  2000  . PARTIAL COLECTOMY  2003   ascending colon  . TONSILLECTOMY AND ADENOIDECTOMY  1947    Family History  Problem Relation Age of Onset  . Cancer Brother        lung  . Heart disease Neg Hx   . Hypertension Neg Hx   . Diabetes Neg Hx     Social History   Socioeconomic History  . Marital status: Married    Spouse name: Not on file  . Number of children: 3  . Years of education: Not on file  . Highest education level: Not on file  Social Needs  . Financial resource strain: Not on file  . Food insecurity - worry: Not on file  . Food insecurity - inability: Not on file  . Transportation needs - medical: Not on file  . Transportation needs - non-medical: Not on file  Occupational History  . Occupation: Theatre stage manager: [employer]    Comment: Retired  Tobacco Use  . Smoking status: Former Smoker    Types: Cigars  . Smokeless tobacco: Never Used  Substance and Sexual Activity  . Alcohol use: Yes    Comment: Glass of wine or scotch before dinner  . Drug use: No  . Sexual activity: Not on file  Other Topics Concern  . Not on file  Social History Narrative   3 sons all married with 2 children   Has living will   Wife, then son Arva Chafe, is health care POA   Would accept resuscitation but no prolonged artificial ventilation   Would probably not want tube feedings   Review of Systems Appetite off somewhat Weight stable Wear seat belt Sleeps well--other than the nocturia Teeth okay--keeps up with dentist No rash or  suspicious lesions No sig back or joint pain No heartburn. Rare swallowing problem---like from hiccup Bowels are irregular. Nothing for 3-4 days, then goes with MOM. No blood. Discussed trying miralax prn    Objective:   Physical Exam  Constitutional: He is oriented to person, place, and time. He appears well-developed. No distress.  HENT:  Mouth/Throat: Oropharynx is clear and moist. No oropharyngeal exudate.  Neck: No thyromegaly present.  Cardiovascular: Normal rate and regular rhythm. Exam reveals no gallop and no friction rub.  Gr 3/6 aortic systolic murmur Feet cool with faint pulses  Pulmonary/Chest: Effort normal and breath sounds normal. No respiratory distress. He has no wheezes. He has no rales.  Abdominal: Soft. He exhibits no distension. There is no tenderness. There is no rebound and no guarding.  Musculoskeletal: He exhibits no edema or tenderness.  Lymphadenopathy:    He has no cervical adenopathy.  Neurological: He is alert and oriented to person, place, and time.  President--- "Dwaine Deter, [place]--- ?" [phone] [id number] Recall-- 2/3  Skin: No rash noted. No erythema.  Psychiatric: He has a normal mood and affect. His behavior is normal.          Assessment & Plan:

## 2017-01-27 NOTE — Assessment & Plan Note (Signed)
Still seems to have good control Will check labs Discussed balance with the neuropathy

## 2017-01-27 NOTE — Assessment & Plan Note (Signed)
BP Readings from Last 3 Encounters:  01/27/17 110/70  08/11/16 124/80  07/22/16 118/82   Good control

## 2017-01-27 NOTE — Assessment & Plan Note (Signed)
Stable No rest pain or skin breakdown

## 2017-01-27 NOTE — Assessment & Plan Note (Signed)
I have personally reviewed the Medicare Annual Wellness questionnaire and have noted 1. The patient's medical and social history 2. Their use of alcohol, tobacco or illicit drugs 3. Their current medications and supplements 4. The patient's functional ability including ADL's, fall risks, home safety risks and hearing or visual             impairment. 5. Diet and physical activities 6. Evidence for depression or mood disorders  The patients weight, height, BMI and visual acuity have been recorded in the chart I have made referrals, counseling and provided education to the patient based review of the above and I have provided the pt with a written personalized care plan for preventive services.  I have provided you with a copy of your personalized plan for preventive services. Please take the time to review along with your updated medication list.  No cancer screening UTD on imms--yearly flu vaccine Discussed safety

## 2017-01-27 NOTE — Progress Notes (Signed)
Hearing Screening Comments: Wears hearing aids Vision Screening Comments: January 2018

## 2017-01-27 NOTE — Assessment & Plan Note (Signed)
See social history 

## 2017-03-08 DIAGNOSIS — E119 Type 2 diabetes mellitus without complications: Secondary | ICD-10-CM | POA: Diagnosis not present

## 2017-03-14 LAB — HM DIABETES EYE EXAM

## 2017-03-15 ENCOUNTER — Encounter: Payer: Self-pay | Admitting: Internal Medicine

## 2017-03-29 ENCOUNTER — Other Ambulatory Visit: Payer: Self-pay | Admitting: Internal Medicine

## 2017-04-28 ENCOUNTER — Other Ambulatory Visit: Payer: Self-pay | Admitting: Internal Medicine

## 2017-07-28 ENCOUNTER — Ambulatory Visit: Payer: Medicare Other | Admitting: Internal Medicine

## 2017-08-02 ENCOUNTER — Ambulatory Visit (INDEPENDENT_AMBULATORY_CARE_PROVIDER_SITE_OTHER): Payer: Medicare Other | Admitting: Internal Medicine

## 2017-08-02 ENCOUNTER — Encounter: Payer: Self-pay | Admitting: Internal Medicine

## 2017-08-02 VITALS — BP 122/74 | HR 89 | Temp 98.4°F | Ht 78.0 in | Wt 248.0 lb

## 2017-08-02 DIAGNOSIS — I739 Peripheral vascular disease, unspecified: Secondary | ICD-10-CM

## 2017-08-02 DIAGNOSIS — E1140 Type 2 diabetes mellitus with diabetic neuropathy, unspecified: Secondary | ICD-10-CM | POA: Diagnosis not present

## 2017-08-02 DIAGNOSIS — I350 Nonrheumatic aortic (valve) stenosis: Secondary | ICD-10-CM

## 2017-08-02 DIAGNOSIS — I10 Essential (primary) hypertension: Secondary | ICD-10-CM

## 2017-08-02 LAB — POCT GLYCOSYLATED HEMOGLOBIN (HGB A1C): Hemoglobin A1C: 7 % — AB (ref 4.0–5.6)

## 2017-08-02 NOTE — Progress Notes (Signed)
Subjective:    Patient ID: Shane Grant, male    DOB: 04/24/1931, 84 y.o.   MRN: [id number]  HPI Here for follow up of diabetes and other chronic health conditions Notices he gained some weight No fluid---but probably eating more  Sugars 100-130 based on what he eats Checks every day No symptomatic hypoglycemia Did cut a toe when clipping toenails---he doesn't feel it Does notice trouble with balance--uses cane Uses walker at night to go to bathroom Did have therapy some months ago---not much help for the balance  Still some limitation in walking Not much leg pain recently  No chest pain No SOB No dizziness or syncope Does have some edema---up and down (not clearly related to what he eats)  Current Outpatient Medications on File Prior to Visit  Medication Sig Dispense Refill  . aspirin 81 MG tablet Take 81 mg by mouth daily.      Marland Kitchen atorvastatin (LIPITOR) 80 MG tablet TAKE 1 TABLET BY MOUTH ONCE DAILY 90 tablet 3  . B-D ULTRAFINE III SHORT PEN 31G X 8 MM MISC use as directed 100 each 3  . glipiZIDE (GLUCOTROL) 5 MG tablet TAKE 1 TABLET BY MOUTH TWICE A DAY BEFORE MEALS 180 tablet 3  . LANTUS SOLOSTAR 100 UNIT/ML Solostar Pen inject 10 units subcutaneously daily 15 mL 1  . metFORMIN (GLUCOPHAGE) 1000 MG tablet take 1 tablet by mouth twice a day WITH A MEAL 180 tablet 3   No current facility-administered medications on file prior to visit.     Allergies  Allergen Reactions  . Invokana [Canagliflozin]     Raised sugars and he felt bad  . Ancef [Cefazolin Sodium] Nausea Only    Past Medical History:  Diagnosis Date  . Colon cancer ([place]) 2003   Partial colectomy for cancer in polyp  . Diabetes mellitus   . Hyperlipidemia   . Hypertension ~1997  . Kidney stones 2000   lithotripsy  . Urge incontinence of urine     Past Surgical History:  Procedure Laterality Date  . APPENDECTOMY  2003   with colectomy  . CATARACT EXTRACTION W/ INTRAOCULAR LENS  IMPLANT,  BILATERAL Bilateral 11/14, 12/14   Dr George Ina  . Hepatic lesion  2011   Cryoablation by radiologist  . LITHOTRIPSY  2000  . PARTIAL COLECTOMY  2003   ascending colon  . TONSILLECTOMY AND ADENOIDECTOMY  1947    Family History  Problem Relation Age of Onset  . Cancer Brother        lung  . Heart disease Neg Hx   . Hypertension Neg Hx   . Diabetes Neg Hx     Social History   Socioeconomic History  . Marital status: Married    Spouse name: Not on file  . Number of children: 3  . Years of education: Not on file  . Highest education level: Not on file  Occupational History  . 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 Financial resource strain: Not on file  . Food insecurity:    Worry: Not on file    Inability: Not on file  . Transportation needs:    Medical: Not on file    Non-medical: Not on file  Tobacco Use  . Smoking status: Former Smoker    Types: Cigars  . Smokeless tobacco: Never Used  Substance and Sexual Activity  . Alcohol use: Yes    Comment: Glass of wine or  scotch before dinner  . Drug use: No  . Sexual activity: Not on file  Lifestyle  . Physical activity:    Days per week: Not on file    Minutes per session: Not on file  . Stress: Not on file  Relationships  . Social connections:    Talks on phone: Not on file    Gets together: Not on file    Attends religious service: Not on file    Active member of club or organization: Not on file    Attends meetings of clubs or organizations: Not on file    Relationship status: Not on file  . Intimate partner violence:    Fear of current or ex partner: Not on file    Emotionally abused: Not on file    Physically abused: Not on file    Forced sexual activity: Not on file  Other Topics Concern  . Not on file  Social History Narrative   3 sons all married with 2 children   Has living will   Wife, then son Arva Chafe, is health care POA   Would accept  resuscitation but no prolonged artificial ventilation   Would probably not want tube feedings   Review of Systems  Appetite is fine Sleeps okay--but nocturia x 4 (gets right back to sleep). Does empty bladder okay     Objective:   Physical Exam  Constitutional: He appears well-developed. No distress.  Neck: No thyromegaly present.  Cardiovascular:  Gr 3/6 coarse systolic murmur--base and apex Feet warm with faint pulses  Respiratory: Effort normal and breath sounds normal. No respiratory distress. He has no wheezes. He has no rales.  Musculoskeletal:  1+ pedal edema  Lymphadenopathy:    He has no cervical adenopathy.  Neurological:  Decreased sensation in feet  Skin:  No foot lesions Mycotic toenails  Psychiatric: He has a normal mood and affect. His behavior is normal.           Assessment & Plan:

## 2017-08-02 NOTE — Assessment & Plan Note (Signed)
No current symptoms Would hold off on further testing

## 2017-08-02 NOTE — Assessment & Plan Note (Addendum)
Seems to still be well controlled despite the weight gain Neuropathy affects balance--but no troubling pain Will check A1c  Lab Results  Component Value Date   HGBA1C 7.0 (A) 08/02/2017   Good control still No med changes needed

## 2017-08-02 NOTE — Assessment & Plan Note (Signed)
BP Readings from Last 3 Encounters:  08/02/17 122/74  01/27/17 110/70  08/11/16 124/80   Good control No change needed

## 2017-08-02 NOTE — Assessment & Plan Note (Signed)
No recent pain--but I don't think he is walking much

## 2017-08-16 ENCOUNTER — Other Ambulatory Visit: Payer: Self-pay | Admitting: Internal Medicine

## 2017-08-28 ENCOUNTER — Other Ambulatory Visit: Payer: Self-pay | Admitting: Internal Medicine

## 2017-09-22 ENCOUNTER — Other Ambulatory Visit: Payer: Self-pay | Admitting: Internal Medicine

## 2017-11-30 DIAGNOSIS — Z23 Encounter for immunization: Secondary | ICD-10-CM | POA: Diagnosis not present

## 2018-02-20 ENCOUNTER — Ambulatory Visit (INDEPENDENT_AMBULATORY_CARE_PROVIDER_SITE_OTHER): Payer: Medicare Other | Admitting: Internal Medicine

## 2018-02-20 ENCOUNTER — Encounter: Payer: Self-pay | Admitting: Internal Medicine

## 2018-02-20 VITALS — BP 132/80 | HR 78 | Temp 98.3°F | Ht 78.0 in | Wt 244.0 lb

## 2018-02-20 DIAGNOSIS — E1140 Type 2 diabetes mellitus with diabetic neuropathy, unspecified: Secondary | ICD-10-CM | POA: Diagnosis not present

## 2018-02-20 DIAGNOSIS — I350 Nonrheumatic aortic (valve) stenosis: Secondary | ICD-10-CM

## 2018-02-20 DIAGNOSIS — Z0000 Encounter for general adult medical examination without abnormal findings: Secondary | ICD-10-CM

## 2018-02-20 DIAGNOSIS — I10 Essential (primary) hypertension: Secondary | ICD-10-CM | POA: Diagnosis not present

## 2018-02-20 DIAGNOSIS — I739 Peripheral vascular disease, unspecified: Secondary | ICD-10-CM

## 2018-02-20 DIAGNOSIS — Z7189 Other specified counseling: Secondary | ICD-10-CM | POA: Diagnosis not present

## 2018-02-20 DIAGNOSIS — N3941 Urge incontinence: Secondary | ICD-10-CM | POA: Diagnosis not present

## 2018-02-20 LAB — COMPREHENSIVE METABOLIC PANEL
ALT: 14 U/L (ref 0–53)
AST: 13 U/L (ref 0–37)
Albumin: 4 g/dL (ref 3.5–5.2)
Alkaline Phosphatase: 81 U/L (ref 39–117)
BUN: 28 mg/dL — ABNORMAL HIGH (ref 6–23)
CALCIUM: 10 mg/dL (ref 8.4–10.5)
CO2: 28 mEq/L (ref 19–32)
Chloride: 104 mEq/L (ref 96–112)
Creatinine, Ser: 1.2 mg/dL (ref 0.40–1.50)
GFR: 61.29 mL/min (ref >60.00)
Glucose, Bld: 127 mg/dL — ABNORMAL HIGH (ref 70–99)
POTASSIUM: 4.8 meq/L (ref 3.5–5.1)
Sodium: 138 mEq/L (ref 135–145)
Total Bilirubin: 0.5 mg/dL (ref 0.2–1.2)
Total Protein: 6.9 g/dL (ref 6.0–8.3)

## 2018-02-20 LAB — CBC
HCT: 38 % — ABNORMAL LOW (ref 39.0–52.0)
Hemoglobin: 12.8 g/dL — ABNORMAL LOW (ref 13.0–17.0)
MCHC: 33.7 g/dL (ref 30.0–36.0)
MCV: 89.8 fl (ref 78.0–100.0)
Platelets: 318 10*3/uL (ref 150.0–400.0)
RBC: 4.23 Mil/uL (ref 4.22–5.81)
RDW: 13.6 % (ref 11.5–15.5)
WBC: 10.3 10*3/uL (ref 4.0–10.5)

## 2018-02-20 LAB — LIPID PANEL
Cholesterol: 256 mg/dL — ABNORMAL HIGH (ref 0–200)
HDL: 52.9 mg/dL (ref >39.00)
LDL Cholesterol: 180 mg/dL — ABNORMAL HIGH (ref 0–99)
NONHDL: 203.4
Total CHOL/HDL Ratio: 5
Triglycerides: 115 mg/dL (ref 0.0–149.0)
VLDL: 23 mg/dL (ref 0.0–40.0)

## 2018-02-20 LAB — HEMOGLOBIN A1C: Hgb A1c MFr Bld: 7.2 % — ABNORMAL HIGH (ref 4.6–6.5)

## 2018-02-20 LAB — HM DIABETES FOOT EXAM

## 2018-02-20 NOTE — Assessment & Plan Note (Signed)
Wears pads

## 2018-02-20 NOTE — Assessment & Plan Note (Signed)
Urged him to restart his walking---is affecting his fitness now

## 2018-02-20 NOTE — Assessment & Plan Note (Signed)
See social history 

## 2018-02-20 NOTE — Progress Notes (Signed)
Hearing Screening Comments: Has hearing aids. He is wearing them today. Vision Screening Comments: March 08, 2017. Pt is scheduling yearly exam

## 2018-02-20 NOTE — Progress Notes (Signed)
Subjective:    Patient ID: Shane Grant, male    DOB: 11-Jan-1932, 85 y.o.   MRN: [id number]  HPI Here for Medicare wellness visit and follow up of chronic health conditions Reviewed form and advanced directives Reviewed other doctors Enjoys 1-2 drinks daily---scotch or wine No tobacco Exercises regularly---doing leg lifts, etc Did fall once but probably over a year ago--no injury No depression or anhedonia Independent with instrumental ADLs Vision okay Has hearing aides--they help Mild memory issues--nothing worrisome  Checks sugars daily 100-130 depending on what he eats No hypoglycemia  Chronic foot numbness but no sig pain  Not walking as much No clear cut walking leg pain lately He notes balance problems--does use cane Gets dizzy upon standing too quickly  No chest pain or SOB Mild swelling in ankles at end of day--better in AM No palpitations Occasional headache in AM--go away quickly (if he oversleeps)  No problems with statin  Current Outpatient Medications on File Prior to Visit  Medication Sig Dispense Refill  . aspirin 81 MG tablet Take 81 mg by mouth daily.      Marland Kitchen atorvastatin (LIPITOR) 80 MG tablet TAKE 1 TABLET BY MOUTH ONCE DAILY 90 tablet 3  . B-D ULTRAFINE III SHORT PEN 31G X 8 MM MISC USE AS DIRECTED 100 each 3  . glipiZIDE (GLUCOTROL) 5 MG tablet TAKE 1 TABLET BY MOUTH TWICE A DAY BEFORE MEALS 180 tablet 3  . LANTUS SOLOSTAR 100 UNIT/ML Solostar Pen INJECT 10 UNITS SUBCUTANEOUSLY ONCE DAILY 15 mL 5  . metFORMIN (GLUCOPHAGE) 1000 MG tablet TAKE 1 TABLET BY MOUTH TWICE DAILY WITH FOOD 180 tablet 2   No current facility-administered medications on file prior to visit.     Allergies  Allergen Reactions  . Invokana [Canagliflozin]     Raised sugars and he felt bad  . Ancef [Cefazolin Sodium] Nausea Only    Past Medical History:  Diagnosis Date  . Colon cancer ([place]) 2003   Partial colectomy for cancer in polyp  . Diabetes mellitus   .  Hyperlipidemia   . Hypertension ~1997  . Kidney stones 2000   lithotripsy  . Urge incontinence of urine     Past Surgical History:  Procedure Laterality Date  . APPENDECTOMY  2003   with colectomy  . CATARACT EXTRACTION W/ INTRAOCULAR LENS  IMPLANT, BILATERAL Bilateral 11/14, 12/14   Dr George Ina  . Hepatic lesion  2011   Cryoablation by radiologist  . LITHOTRIPSY  2000  . PARTIAL COLECTOMY  2003   ascending colon  . TONSILLECTOMY AND ADENOIDECTOMY  1947    Family History  Problem Relation Age of Onset  . Cancer Brother        lung  . Heart disease Neg Hx   . Hypertension Neg Hx   . Diabetes Neg Hx     Social History   Socioeconomic History  . Marital status: Married    Spouse name: Not on file  . Number of children: 3  . Years of education: Not on file  . Highest education level: Not on file  Occupational History  . 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 Financial resource strain: Not on file  . Food insecurity:    Worry: Not on file    Inability: Not on file  . Transportation needs:    Medical: Not on file    Non-medical: Not on file  Tobacco Use  . Smoking  status: Former Smoker    Types: Cigars  . Smokeless tobacco: Never Used  Substance and Sexual Activity  . Alcohol use: Yes    Comment: Glass of wine or scotch before dinner  . Drug use: No  . Sexual activity: Not on file  Lifestyle  . Physical activity:    Days per week: Not on file    Minutes per session: Not on file  . Stress: Not on file  Relationships  . Social connections:    Talks on phone: Not on file    Gets together: Not on file    Attends religious service: Not on file    Active member of club or organization: Not on file    Attends meetings of clubs or organizations: Not on file    Relationship status: Not on file  . Intimate partner violence:    Fear of current or ex partner: Not on file    Emotionally abused: Not on file      Physically abused: Not on file    Forced sexual activity: Not on file  Other Topics Concern  . Not on file  Social History Narrative   3 sons all married with 2 children   Has living will   Wife, then son Arva Chafe, is health care POA   Would accept resuscitation but no prolonged artificial ventilation   Would probably not want tube feedings   Review of Systems Appetite is okay--not as big as in past. Weight stable Wears seat belt Teeth okay--- keeps up with dentist No skin rash or suspicious lesions No heartburn or dysphagia (rare gagging spell after eating) Bowels are okay----erratic though. Stable for 2 years or so. Has used miralax at times Still has urinary incontinence---wears pad. No dysuria or hematuria No sig back or joint pain    Objective:   Physical Exam  Constitutional: He is oriented to person, place, and time. He appears well-developed. No distress.  HENT:  Mouth/Throat: Oropharynx is clear and moist. No oropharyngeal exudate.  Neck: No thyromegaly present.  Cardiovascular: Normal rate and regular rhythm. Exam reveals no gallop.  Gr 3/6 systolic murmur Feet cool Faint pulse on right, absent on left  Respiratory: Effort normal and breath sounds normal. No respiratory distress. He has no wheezes. He has no rales.  GI: Soft. There is no abdominal tenderness.  Musculoskeletal:        General: No tenderness.     Comments: 1+ ankle edema  Lymphadenopathy:    He has no cervical adenopathy.  Neurological: He is alert and oriented to person, place, and time.  President--- "Dwaine Deter, Bush" [id number] Recall 3/3  Decreased sensation in feet  Skin:  No foot lesions  Psychiatric: He has a normal mood and affect. His behavior is normal.           Assessment & Plan:

## 2018-02-20 NOTE — Assessment & Plan Note (Signed)
BP Readings from Last 3 Encounters:  02/20/18 132/80  08/02/17 122/74  01/27/17 110/70   Good control

## 2018-02-20 NOTE — Assessment & Plan Note (Signed)
I have personally reviewed the Medicare Annual Wellness questionnaire and have noted 1. The patient's medical and social history 2. Their use of alcohol, tobacco or illicit drugs 3. Their current medications and supplements 4. The patient's functional ability including ADL's, fall risks, home safety risks and hearing or visual             impairment. 5. Diet and physical activities 6. Evidence for depression or mood disorders  The patients weight, height, BMI and visual acuity have been recorded in the chart I have made referrals, counseling and provided education to the patient based review of the above and I have provided the pt with a written personalized care plan for preventive services.  I have provided you with a copy of your personalized plan for preventive services. Please take the time to review along with your updated medication list.  Yearly flu vaccine No cancer screening due to age Discussed fitness

## 2018-02-20 NOTE — Assessment & Plan Note (Signed)
Still seems to have good control Neuropathy mostly an issue for balance--no pain

## 2018-02-20 NOTE — Assessment & Plan Note (Signed)
No dizziness or chest pain

## 2018-02-22 NOTE — Telephone Encounter (Signed)
Per pt, he has not taken atorvastatin in a year even though it was on his med list and confirmed at his OV. Do I go ahead and fill the 80mg ?

## 2018-02-23 MED ORDER — ATORVASTATIN CALCIUM 80 MG PO TABS: 80.0000 mg | ORAL_TABLET | Freq: Every day | ORAL | 3 refills | Status: DC

## 2018-03-22 ENCOUNTER — Other Ambulatory Visit: Payer: Self-pay | Admitting: Internal Medicine

## 2018-03-26 DIAGNOSIS — H3554 Dystrophies primarily involving the retinal pigment epithelium: Secondary | ICD-10-CM | POA: Diagnosis not present

## 2018-03-26 DIAGNOSIS — H43813 Vitreous degeneration, bilateral: Secondary | ICD-10-CM | POA: Diagnosis not present

## 2018-03-26 LAB — HM DIABETES EYE EXAM

## 2018-03-28 ENCOUNTER — Encounter: Payer: Self-pay | Admitting: Internal Medicine

## 2018-06-20 ENCOUNTER — Other Ambulatory Visit: Payer: Self-pay | Admitting: Internal Medicine

## 2018-08-22 ENCOUNTER — Encounter: Payer: Self-pay | Admitting: Internal Medicine

## 2018-08-22 ENCOUNTER — Other Ambulatory Visit: Payer: Self-pay

## 2018-08-22 ENCOUNTER — Ambulatory Visit (INDEPENDENT_AMBULATORY_CARE_PROVIDER_SITE_OTHER): Payer: Medicare Other | Admitting: Internal Medicine

## 2018-08-22 VITALS — BP 120/80 | HR 88 | Ht 78.0 in | Wt 238.0 lb

## 2018-08-22 DIAGNOSIS — I10 Essential (primary) hypertension: Secondary | ICD-10-CM | POA: Diagnosis not present

## 2018-08-22 DIAGNOSIS — E1140 Type 2 diabetes mellitus with diabetic neuropathy, unspecified: Secondary | ICD-10-CM | POA: Diagnosis not present

## 2018-08-22 DIAGNOSIS — R42 Dizziness and giddiness: Secondary | ICD-10-CM | POA: Diagnosis not present

## 2018-08-22 LAB — POCT GLYCOSYLATED HEMOGLOBIN (HGB A1C): Hemoglobin A1C: 6.2 % — AB (ref 4.0–5.6)

## 2018-08-22 NOTE — Assessment & Plan Note (Signed)
Likely neuropathy as well Discussed safety Does have aortic murmur still---but no stenosis on echo in 2014 Would consider rechecking this if symptoms worsen

## 2018-08-22 NOTE — Assessment & Plan Note (Signed)
BP Readings from Last 3 Encounters:  08/22/18 120/80  02/20/18 132/80  08/02/17 122/74   Seems to have good response without treatment

## 2018-08-22 NOTE — Assessment & Plan Note (Addendum)
Seems to be well controlled Will check A1c here Discussed neuropathy--- use cane/walker for balance 4 wheeled walker to try to walk aerobically  Lab Results  Component Value Date   HGBA1C 6.2 (A) 08/22/2018   Excellent control No changes needed

## 2018-08-22 NOTE — Progress Notes (Signed)
Subjective:    Patient ID: Shane Grant, male    DOB: 1931/04/05, 85 y.o.   MRN: [id number]  HPI Here for follow up of diabetes  Has been "locked down" fairly well Doing okay mentally and spiritually  Has noticed some trouble with his balance No pain in legs Uses cane or walker in house--cane when he goes out Is doing exercises --leg lifts, etc  Has some dizziness at times Mostly if he stands up quickly--will have to stand in place briefly or sits back down for a while  Tries to eat right Checks sugars daily---running 100-135 fasting No low sugar reactions  Current Outpatient Medications on File Prior to Visit  Medication Sig Dispense Refill  . aspirin 81 MG tablet Take 81 mg by mouth daily.      Marland Kitchen atorvastatin (LIPITOR) 80 MG tablet Take 1 tablet (80 mg total) by mouth daily. 90 tablet 3  . B-D ULTRAFINE III SHORT PEN 31G X 8 MM MISC USE AS DIRECTED 100 each 3  . glipiZIDE (GLUCOTROL) 5 MG tablet TAKE 1 TABLET BY MOUTH TWICE DAILY BEFORE MEALS 180 tablet 3  . LANTUS SOLOSTAR 100 UNIT/ML Solostar Pen INJECT 10 UNITS SUBCUTANEOUSLY ONCE DAILY 15 mL 5  . metFORMIN (GLUCOPHAGE) 1000 MG tablet TAKE 1 TABLET BY MOUTH TWICE DAILY WITH FOOD 180 tablet 0   No current facility-administered medications on file prior to visit.     Allergies  Allergen Reactions  . Invokana [Canagliflozin]     Raised sugars and he felt bad  . Ancef [Cefazolin Sodium] Nausea Only    Past Medical History:  Diagnosis Date  . Colon cancer ([place]) 2003   Partial colectomy for cancer in polyp  . Diabetes mellitus   . Hyperlipidemia   . Hypertension ~1997  . Kidney stones 2000   lithotripsy  . Urge incontinence of urine     Past Surgical History:  Procedure Laterality Date  . APPENDECTOMY  2003   with colectomy  . CATARACT EXTRACTION W/ INTRAOCULAR LENS  IMPLANT, BILATERAL Bilateral 11/14, 12/14   Dr George Ina  . Hepatic lesion  2011   Cryoablation by radiologist  . LITHOTRIPSY  2000  .  PARTIAL COLECTOMY  2003   ascending colon  . TONSILLECTOMY AND ADENOIDECTOMY  1947    Family History  Problem Relation Age of Onset  . Cancer Brother        lung  . Heart disease Neg Hx   . Hypertension Neg Hx   . Diabetes Neg Hx     Social History   Socioeconomic History  . Marital status: Married    Spouse name: Not on file  . Number of children: 3  . Years of education: Not on file  . Highest education level: Not on file  Occupational History  . 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 Financial resource strain: Not on file  . Food insecurity    Worry: Not on file    Inability: Not on file  . Transportation needs    Medical: Not on file    Non-medical: Not on file  Tobacco Use  . Smoking status: Former Smoker    Types: Cigars  . Smokeless tobacco: Never Used  Substance and Sexual Activity  . Alcohol use: Yes    Comment: Glass of wine or scotch before dinner  . Drug use: No  . Sexual activity: Not on file  Lifestyle  .  Physical activity    Days per week: Not on file    Minutes per session: Not on file  . Stress: Not on file  Relationships  . Social Herbalist on phone: Not on file    Gets together: Not on file    Attends religious service: Not on file    Active member of club or organization: Not on file    Attends meetings of clubs or organizations: Not on file    Relationship status: Not on file  . Intimate partner violence    Fear of current or ex partner: Not on file    Emotionally abused: Not on file    Physically abused: Not on file    Forced sexual activity: Not on file  Other Topics Concern  . Not on file  Social History Narrative   3 sons all married with 2 children   Has living will   Wife, then son Arva Chafe, is health care POA   Would accept resuscitation but no prolonged artificial ventilation   Would probably not want tube feedings   Review of Systems  No chest pain No  SOB No aerobic exercise--considering exercise bike but gym closed Weight is down a few pounds    Objective:   Physical Exam  Constitutional: He appears well-developed. No distress.  Neck: No thyromegaly present.  Cardiovascular: Normal rate, regular rhythm and intact distal pulses. Exam reveals no gallop.  Soft coarse aortic systolic murmur  Respiratory: Effort normal and breath sounds normal. No respiratory distress. He has no wheezes. He has no rales.  Musculoskeletal:     Comments: Trace ankle edema  Lymphadenopathy:    He has no cervical adenopathy.  Skin:  No foot lesions  Psychiatric: He has a normal mood and affect. His behavior is normal.           Assessment & Plan:

## 2018-09-17 ENCOUNTER — Other Ambulatory Visit: Payer: Self-pay | Admitting: Internal Medicine

## 2018-10-10 DIAGNOSIS — H43813 Vitreous degeneration, bilateral: Secondary | ICD-10-CM | POA: Diagnosis not present

## 2018-12-05 MED ORDER — LANTUS SOLOSTAR 100 UNIT/ML ~~LOC~~ SOPN: PEN_INJECTOR | SUBCUTANEOUS | 11 refills | Status: AC

## 2019-03-01 ENCOUNTER — Ambulatory Visit (INDEPENDENT_AMBULATORY_CARE_PROVIDER_SITE_OTHER): Payer: Medicare Other | Admitting: Internal Medicine

## 2019-03-01 ENCOUNTER — Encounter: Payer: Self-pay | Admitting: Internal Medicine

## 2019-03-01 ENCOUNTER — Other Ambulatory Visit: Payer: Self-pay

## 2019-03-01 VITALS — BP 122/68 | HR 97 | Temp 97.7°F | Resp 14 | Ht 77.75 in | Wt 233.8 lb

## 2019-03-01 DIAGNOSIS — L97911 Non-pressure chronic ulcer of unspecified part of right lower leg limited to breakdown of skin: Secondary | ICD-10-CM | POA: Diagnosis not present

## 2019-03-01 DIAGNOSIS — I10 Essential (primary) hypertension: Secondary | ICD-10-CM | POA: Diagnosis not present

## 2019-03-01 DIAGNOSIS — E785 Hyperlipidemia, unspecified: Secondary | ICD-10-CM

## 2019-03-01 DIAGNOSIS — E1140 Type 2 diabetes mellitus with diabetic neuropathy, unspecified: Secondary | ICD-10-CM | POA: Diagnosis not present

## 2019-03-01 DIAGNOSIS — N138 Other obstructive and reflux uropathy: Secondary | ICD-10-CM

## 2019-03-01 DIAGNOSIS — L97909 Non-pressure chronic ulcer of unspecified part of unspecified lower leg with unspecified severity: Secondary | ICD-10-CM | POA: Insufficient documentation

## 2019-03-01 DIAGNOSIS — Z7189 Other specified counseling: Secondary | ICD-10-CM

## 2019-03-01 DIAGNOSIS — N401 Enlarged prostate with lower urinary tract symptoms: Secondary | ICD-10-CM | POA: Diagnosis not present

## 2019-03-01 DIAGNOSIS — Z0000 Encounter for general adult medical examination without abnormal findings: Secondary | ICD-10-CM | POA: Diagnosis not present

## 2019-03-01 LAB — COMPREHENSIVE METABOLIC PANEL
ALT: 22 U/L (ref 0–53)
AST: 18 U/L (ref 0–37)
Albumin: 3.8 g/dL (ref 3.5–5.2)
Alkaline Phosphatase: 115 U/L (ref 39–117)
BUN: 32 mg/dL — ABNORMAL HIGH (ref 6–23)
CO2: 27 mEq/L (ref 19–32)
Calcium: 10 mg/dL (ref 8.4–10.5)
Chloride: 103 mEq/L (ref 96–112)
Creatinine, Ser: 1.4 mg/dL (ref 0.40–1.50)
GFR: 48.15 mL/min — ABNORMAL LOW (ref >60.00)
Glucose, Bld: 118 mg/dL — ABNORMAL HIGH (ref 70–99)
Potassium: 4.5 mEq/L (ref 3.5–5.1)
Sodium: 139 mEq/L (ref 135–145)
Total Bilirubin: 0.5 mg/dL (ref 0.2–1.2)
Total Protein: 7.2 g/dL (ref 6.0–8.3)

## 2019-03-01 LAB — CBC
HCT: 38 % — ABNORMAL LOW (ref 39.0–52.0)
Hemoglobin: 12.3 g/dL — ABNORMAL LOW (ref 13.0–17.0)
MCHC: 32.4 g/dL (ref 30.0–36.0)
MCV: 91.4 fl (ref 78.0–100.0)
Platelets: 361 10*3/uL (ref 150.0–400.0)
RBC: 4.15 Mil/uL — ABNORMAL LOW (ref 4.22–5.81)
RDW: 13.2 % (ref 11.5–15.5)
WBC: 14.8 10*3/uL — ABNORMAL HIGH (ref 4.0–10.5)

## 2019-03-01 LAB — LIPID PANEL
Cholesterol: 155 mg/dL (ref 0–200)
HDL: 41.7 mg/dL (ref >39.00)
LDL Cholesterol: 87 mg/dL (ref 0–99)
NonHDL: 113.02
Total CHOL/HDL Ratio: 4
Triglycerides: 132 mg/dL (ref 0.0–149.0)
VLDL: 26.4 mg/dL (ref 0.0–40.0)

## 2019-03-01 LAB — HM DIABETES FOOT EXAM

## 2019-03-01 LAB — HEMOGLOBIN A1C: Hgb A1c MFr Bld: 7.1 % — ABNORMAL HIGH (ref 4.6–6.5)

## 2019-03-01 NOTE — Assessment & Plan Note (Signed)
BP Readings from Last 3 Encounters:  03/01/19 122/68  08/22/18 120/80  02/20/18 132/80   He does check BP at home Usually [id number] systolic Good control here

## 2019-03-01 NOTE — Progress Notes (Signed)
Subjective:    Patient ID: Shane Grant, male    DOB: 07-07-1931, 86 y.o.   MRN: [id number]  HPI Here for Medicare wellness visit and follow up of chronic health conditions  This visit occurred during the SARS-CoV-2 public health emergency.  Safety protocols were in place, including screening questions prior to the visit, additional usage of staff PPE, and extensive cleaning of exam room while observing appropriate contact time as indicated for disinfecting solutions.   Reviewed form and advanced directives Reviewed other doctors Enjoys 1-2 scotch or wine daily No tobacco Tries to exercise regularly---walks with the rollator --and does leg lifts (with weights) [place] once this year---lost balance in kitchen. No injury No depression or anhedonia Vision is fine Hearing aides are helpful Independent with instrumental ADLs Mild memory issues  Sugars running up a little---?from small oranges lately Ongoing neuropathy  No chest pain  No SOB Will have balance problem with walking--but no dizziness or syncope (goes away with rollator) Slight edema per wife--he doesn't notice  Current Outpatient Medications on File Prior to Visit  Medication Sig Dispense Refill  . aspirin 81 MG tablet Take 81 mg by mouth daily.      Marland Kitchen atorvastatin (LIPITOR) 80 MG tablet Take 1 tablet (80 mg total) by mouth daily. 90 tablet 3  . B-D ULTRAFINE III SHORT PEN 31G X 8 MM MISC USE AS DIRECTED 100 each 3  . glipiZIDE (GLUCOTROL) 5 MG tablet TAKE 1 TABLET BY MOUTH TWICE DAILY BEFORE MEALS 180 tablet 3  . Insulin Glargine (LANTUS SOLOSTAR) 100 UNIT/ML Solostar Pen INJECT 10 UNITS SUBCUTANEOUSLY ONCE DAILY 15 mL 11  . metFORMIN (GLUCOPHAGE) 1000 MG tablet TAKE 1 TABLET BY MOUTH TWICE DAILY WITH FOOD 180 tablet 3   No current facility-administered medications on file prior to visit.    Allergies  Allergen Reactions  . Invokana [Canagliflozin]     Raised sugars and he felt bad  . Ancef [Cefazolin Sodium]  Nausea Only    Past Medical History:  Diagnosis Date  . Colon cancer ([place]) 2003   Partial colectomy for cancer in polyp  . Diabetes mellitus   . Hyperlipidemia   . Hypertension ~1997  . Kidney stones 2000   lithotripsy  . Urge incontinence of urine     Past Surgical History:  Procedure Laterality Date  . APPENDECTOMY  2003   with colectomy  . CATARACT EXTRACTION W/ INTRAOCULAR LENS  IMPLANT, BILATERAL Bilateral 11/14, 12/14   Dr George Ina  . Hepatic lesion  2011   Cryoablation by radiologist  . LITHOTRIPSY  2000  . PARTIAL COLECTOMY  2003   ascending colon  . TONSILLECTOMY AND ADENOIDECTOMY  1947    Family History  Problem Relation Age of Onset  . Cancer Brother        lung  . Heart disease Neg Hx   . Hypertension Neg Hx   . Diabetes Neg Hx     Social History   Socioeconomic History  . Marital status: Married    Spouse name: Not on file  . Number of children: 3  . Years of education: Not on file  . Highest education level: Not on file  Occupational History  . Occupation: Theatre stage manager: [employer]    Comment: Retired  Tobacco Use  . Smoking status: Former Smoker    Types: Cigars  . Smokeless tobacco: Never Used  Substance and Sexual Activity  . Alcohol use: Yes    Comment: Glass of  wine or scotch before dinner  . Drug use: No  . Sexual activity: Not on file  Other Topics Concern  . Not on file  Social History Narrative   3 sons all married with 2 children   Has living will   Wife, then son Arva Chafe, is health care POA   Would accept resuscitation but no prolonged artificial ventilation   Would probably not want tube feedings   Social Determinants of Health   Financial Resource Strain:   . Difficulty of Paying Living Expenses: Not on file  Food Insecurity:   . Worried About Charity fundraiser in the Last Year: Not on file  . Ran Out of Food in the Last Year: Not on file  Transportation Needs:   . Lack of Transportation  (Medical): Not on file  . Lack of Transportation (Non-Medical): Not on file  Physical Activity:   . Days of Exercise per Week: Not on file  . Minutes of Exercise per Session: Not on file  Stress:   . Feeling of Stress : Not on file  Social Connections:   . Frequency of Communication with Friends and Family: Not on file  . Frequency of Social Gatherings with Friends and Family: Not on file  . Attends Religious Services: Not on file  . Active Member of Clubs or Organizations: Not on file  . Attends Archivist Meetings: Not on file  . Marital Status: Not on file  Intimate Partner Violence:   . Fear of Current or Ex-Partner: Not on file  . Emotionally Abused: Not on file  . Physically Abused: Not on file  . Sexually Abused: Not on file   Review of Systems Has had a "thick throat" for a few weeks---slight productive cough. Not sick Dropped a plastic crate on his right ankle a few weeks ago--slowly healing Sleeps well at night---but frequent nocturia (3). Naps in the day Appetite is okay Weight is stable Bowels are variable---diarrhea and constipation at times. No blood Voids okay in day. Flow is okay Wears seat belt Teeth are okay---keeps up with dentist No suspicious skin lesions No heartburn or dysphagia    Objective:   Physical Exam  Constitutional: He is oriented to person, place, and time. He appears well-developed. No distress.  HENT:  Mouth/Throat: Oropharynx is clear and moist. No oropharyngeal exudate.  Neck: No thyromegaly present.  Cardiovascular: Normal rate and regular rhythm. Exam reveals no gallop.  Gr 3/6 aortic systolic murmur--radiates over entire precordium 1+ left foot pulses, trace on right  Respiratory: Effort normal and breath sounds normal. No respiratory distress. He has no wheezes. He has no rales.  GI: Soft. There is no abdominal tenderness.  Musculoskeletal:     Comments: 1+ edema right ankle  Lymphadenopathy:    He has no cervical  adenopathy.  Neurological: He is alert and oriented to person, place, and time.  President--"Trump, Ramonita Lab" [id number] Recall 1/3  Decreased sensation in feet  Skin:  Scabbed lesion above right ankle Ulcer above that with slough. Not infected. About 3 x 3cm  Psychiatric: He has a normal mood and affect. His behavior is normal.           Assessment & Plan:

## 2019-03-01 NOTE — Assessment & Plan Note (Signed)
Mostly nocturia No need for meds as yet

## 2019-03-01 NOTE — Assessment & Plan Note (Signed)
Superficial traumatic ulcer Slow to heal Gently removed slough and showed him how to clean Continue neosporin daily

## 2019-03-01 NOTE — Assessment & Plan Note (Signed)
I have personally reviewed the Medicare Annual Wellness questionnaire and have noted 1. The patient's medical and social history 2. Their use of alcohol, tobacco or illicit drugs 3. Their current medications and supplements 4. The patient's functional ability including ADL's, fall risks, home safety risks and hearing or visual             impairment. 5. Diet and physical activities 6. Evidence for depression or mood disorders  The patients weight, height, BMI and visual acuity have been recorded in the chart I have made referrals, counseling and provided education to the patient based review of the above and I have provided the pt with a written personalized care plan for preventive services.  I have provided you with a copy of your personalized plan for preventive services. Please take the time to review along with your updated medication list.  No cancer screening at his age Will get COVID in the next few days Discussed fitness Yearly flu vaccine

## 2019-03-01 NOTE — Assessment & Plan Note (Signed)
See social history 

## 2019-03-01 NOTE — Assessment & Plan Note (Signed)
Continues on the statin No side effects with it Will continue given his DM

## 2019-03-01 NOTE — Assessment & Plan Note (Signed)
Checks sugars once a week---generally under 110, but recently up at 160 Working on eating No hypoglycemic reactions Ongoing neuropathy with balance issues Will check his labs

## 2019-03-04 DIAGNOSIS — Z23 Encounter for immunization: Secondary | ICD-10-CM | POA: Diagnosis not present

## 2019-03-18 ENCOUNTER — Other Ambulatory Visit: Payer: Self-pay

## 2019-03-18 MED ORDER — METFORMIN HCL 1000 MG PO TABS: 1000.0000 mg | ORAL_TABLET | Freq: Two times a day (BID) | ORAL | 1 refills | Status: AC

## 2019-03-22 MED ORDER — ATORVASTATIN CALCIUM 80 MG PO TABS: 80.0000 mg | ORAL_TABLET | Freq: Every day | ORAL | 3 refills | Status: AC

## 2019-04-01 DIAGNOSIS — Z23 Encounter for immunization: Secondary | ICD-10-CM | POA: Diagnosis not present

## 2019-04-02 ENCOUNTER — Telehealth: Payer: Self-pay | Admitting: Internal Medicine

## 2019-04-02 MED ORDER — GLIPIZIDE 5 MG PO TABS: 5.0000 mg | ORAL_TABLET | Freq: Two times a day (BID) | ORAL | 3 refills | Status: AC

## 2019-04-02 NOTE — Telephone Encounter (Signed)
We never received anything from the pharmacy. I just sent the refill at his request.

## 2019-04-02 NOTE — Telephone Encounter (Signed)
Patient called in regards to his Glipizide prescription He stated that the pharmacy has not received approval from our office for this medication and can not fill it   Patient stated he has one tablet left  And needed this today

## 2019-08-14 ENCOUNTER — Other Ambulatory Visit: Payer: Self-pay

## 2019-08-14 ENCOUNTER — Emergency Department: Payer: Medicare Other

## 2019-08-14 ENCOUNTER — Inpatient Hospital Stay
Admission: EM | Admit: 2019-08-14 | Discharge: 2019-08-16 | DRG: 660 | Disposition: A | Discharge status: Home Health | Payer: Medicare Other | Source: Skilled Nursing Facility | Attending: Internal Medicine | Admitting: Internal Medicine

## 2019-08-14 DIAGNOSIS — Z87442 Personal history of urinary calculi: Secondary | ICD-10-CM

## 2019-08-14 DIAGNOSIS — Z79899 Other long term (current) drug therapy: Secondary | ICD-10-CM

## 2019-08-14 DIAGNOSIS — I700 Atherosclerosis of aorta: Secondary | ICD-10-CM | POA: Diagnosis not present

## 2019-08-14 DIAGNOSIS — N179 Acute kidney failure, unspecified: Secondary | ICD-10-CM

## 2019-08-14 DIAGNOSIS — Z7982 Long term (current) use of aspirin: Secondary | ICD-10-CM | POA: Diagnosis not present

## 2019-08-14 DIAGNOSIS — N1831 Chronic kidney disease, stage 3a: Secondary | ICD-10-CM | POA: Diagnosis present

## 2019-08-14 DIAGNOSIS — I129 Hypertensive chronic kidney disease with stage 1 through stage 4 chronic kidney disease, or unspecified chronic kidney disease: Secondary | ICD-10-CM | POA: Diagnosis present

## 2019-08-14 DIAGNOSIS — K8020 Calculus of gallbladder without cholecystitis without obstruction: Secondary | ICD-10-CM | POA: Diagnosis not present

## 2019-08-14 DIAGNOSIS — Z85038 Personal history of other malignant neoplasm of large intestine: Secondary | ICD-10-CM

## 2019-08-14 DIAGNOSIS — M79604 Pain in right leg: Secondary | ICD-10-CM | POA: Diagnosis present

## 2019-08-14 DIAGNOSIS — N136 Pyonephrosis: Secondary | ICD-10-CM | POA: Diagnosis not present

## 2019-08-14 DIAGNOSIS — D72829 Elevated white blood cell count, unspecified: Secondary | ICD-10-CM

## 2019-08-14 DIAGNOSIS — R531 Weakness: Secondary | ICD-10-CM | POA: Diagnosis not present

## 2019-08-14 DIAGNOSIS — J189 Pneumonia, unspecified organism: Secondary | ICD-10-CM

## 2019-08-14 DIAGNOSIS — R4020 Unspecified coma: Secondary | ICD-10-CM | POA: Diagnosis not present

## 2019-08-14 DIAGNOSIS — E1122 Type 2 diabetes mellitus with diabetic chronic kidney disease: Secondary | ICD-10-CM | POA: Diagnosis present

## 2019-08-14 DIAGNOSIS — N211 Calculus in urethra: Secondary | ICD-10-CM | POA: Diagnosis present

## 2019-08-14 DIAGNOSIS — Z9049 Acquired absence of other specified parts of digestive tract: Secondary | ICD-10-CM

## 2019-08-14 DIAGNOSIS — Z794 Long term (current) use of insulin: Secondary | ICD-10-CM

## 2019-08-14 DIAGNOSIS — Z20822 Contact with and (suspected) exposure to covid-19: Secondary | ICD-10-CM | POA: Diagnosis present

## 2019-08-14 DIAGNOSIS — J470 Bronchiectasis with acute lower respiratory infection: Secondary | ICD-10-CM | POA: Diagnosis present

## 2019-08-14 DIAGNOSIS — R159 Full incontinence of feces: Secondary | ICD-10-CM | POA: Diagnosis present

## 2019-08-14 DIAGNOSIS — N132 Hydronephrosis with renal and ureteral calculous obstruction: Secondary | ICD-10-CM

## 2019-08-14 DIAGNOSIS — E1140 Type 2 diabetes mellitus with diabetic neuropathy, unspecified: Secondary | ICD-10-CM | POA: Diagnosis present

## 2019-08-14 DIAGNOSIS — K5900 Constipation, unspecified: Secondary | ICD-10-CM | POA: Diagnosis not present

## 2019-08-14 DIAGNOSIS — E1151 Type 2 diabetes mellitus with diabetic peripheral angiopathy without gangrene: Secondary | ICD-10-CM | POA: Diagnosis present

## 2019-08-14 DIAGNOSIS — I739 Peripheral vascular disease, unspecified: Secondary | ICD-10-CM

## 2019-08-14 DIAGNOSIS — E1165 Type 2 diabetes mellitus with hyperglycemia: Secondary | ICD-10-CM | POA: Diagnosis present

## 2019-08-14 DIAGNOSIS — E785 Hyperlipidemia, unspecified: Secondary | ICD-10-CM | POA: Diagnosis present

## 2019-08-14 DIAGNOSIS — J188 Other pneumonia, unspecified organism: Secondary | ICD-10-CM | POA: Diagnosis not present

## 2019-08-14 DIAGNOSIS — R29810 Facial weakness: Secondary | ICD-10-CM | POA: Diagnosis not present

## 2019-08-14 DIAGNOSIS — W19XXXA Unspecified fall, initial encounter: Secondary | ICD-10-CM | POA: Diagnosis not present

## 2019-08-14 DIAGNOSIS — N39490 Overflow incontinence: Secondary | ICD-10-CM | POA: Diagnosis present

## 2019-08-14 DIAGNOSIS — I350 Nonrheumatic aortic (valve) stenosis: Secondary | ICD-10-CM | POA: Diagnosis present

## 2019-08-14 DIAGNOSIS — E869 Volume depletion, unspecified: Secondary | ICD-10-CM | POA: Diagnosis present

## 2019-08-14 DIAGNOSIS — Z87891 Personal history of nicotine dependence: Secondary | ICD-10-CM

## 2019-08-14 DIAGNOSIS — J439 Emphysema, unspecified: Secondary | ICD-10-CM | POA: Diagnosis not present

## 2019-08-14 DIAGNOSIS — N1330 Unspecified hydronephrosis: Secondary | ICD-10-CM | POA: Diagnosis not present

## 2019-08-14 DIAGNOSIS — J479 Bronchiectasis, uncomplicated: Secondary | ICD-10-CM | POA: Diagnosis not present

## 2019-08-14 DIAGNOSIS — I712 Thoracic aortic aneurysm, without rupture: Secondary | ICD-10-CM | POA: Diagnosis not present

## 2019-08-14 DIAGNOSIS — N139 Obstructive and reflux uropathy, unspecified: Secondary | ICD-10-CM | POA: Diagnosis present

## 2019-08-14 LAB — BASIC METABOLIC PANEL
Anion gap: 12 (ref 5–15)
BUN: 40 mg/dL — ABNORMAL HIGH (ref 8–23)
CO2: 24 mmol/L (ref 22–32)
Calcium: 9.7 mg/dL (ref 8.9–10.3)
Chloride: 99 mmol/L (ref 98–111)
Creatinine, Ser: 2.26 mg/dL — ABNORMAL HIGH (ref 0.61–1.24)
GFR calc Af Amer: 30 mL/min — ABNORMAL LOW (ref >60)
GFR calc non Af Amer: 25 mL/min — ABNORMAL LOW (ref >60)
Glucose, Bld: 223 mg/dL — ABNORMAL HIGH (ref 70–99)
Potassium: 4.9 mmol/L (ref 3.5–5.1)
Sodium: 135 mmol/L (ref 135–145)

## 2019-08-14 LAB — CBC
HCT: 36.5 % — ABNORMAL LOW (ref 39.0–52.0)
Hemoglobin: 12.5 g/dL — ABNORMAL LOW (ref 13.0–17.0)
MCH: 30.4 pg (ref 26.0–34.0)
MCHC: 34.2 g/dL (ref 30.0–36.0)
MCV: 88.8 fL (ref 80.0–100.0)
Platelets: 266 10*3/uL (ref 150–400)
RBC: 4.11 MIL/uL — ABNORMAL LOW (ref 4.22–5.81)
RDW: 12.9 % (ref 11.5–15.5)
WBC: 20.5 10*3/uL — ABNORMAL HIGH (ref 4.0–10.5)
nRBC: 0 % (ref 0.0–0.2)

## 2019-08-14 LAB — PROTIME-INR
INR: 1.1 (ref 0.8–1.2)
Prothrombin Time: 13.7 seconds (ref 11.4–15.2)

## 2019-08-14 LAB — TROPONIN I (HIGH SENSITIVITY): Troponin I (High Sensitivity): 10 ng/L (ref <18)

## 2019-08-14 LAB — CK: Total CK: 80 U/L (ref 49–397)

## 2019-08-14 MED ORDER — LACTATED RINGERS IV BOLUS (SEPSIS)
1000.0000 mL | Freq: Once | INTRAVENOUS | Status: AC
  Administered 2019-08-14: 1000 mL via INTRAVENOUS

## 2019-08-14 MED ORDER — IOHEXOL 9 MG/ML PO SOLN: 500.0000 mL | ORAL | Status: AC

## 2019-08-14 NOTE — ED Triage Notes (Signed)
First nurse note- here for fall yesterday, unable to walk since.  VSS with EMS, CBG 258.  Intermittent confusion per EMS

## 2019-08-14 NOTE — Telephone Encounter (Signed)
Spoke to pt's wife. She said he is able to get around with a walker today. Made appt for 08-15-19 at 145.

## 2019-08-14 NOTE — ED Triage Notes (Signed)
Pt arrives to ED via [facility] with c/o constipation x5 days, weakness x5 days, and c/o AMS that started today. Pt denies any c/o pain. No c/o CP or SHOB. No c/o N/V/D or fever. Pt is A&O, in NAD; RR even, regular, and unlabored.

## 2019-08-14 NOTE — Telephone Encounter (Signed)
He needs an appointment

## 2019-08-14 NOTE — ED Notes (Signed)
Wife Tarrence Enck, [phone], call when roomed.

## 2019-08-14 NOTE — ED Provider Notes (Signed)
[facility] Emergency Department Provider Note  ____________________________________________   First MD Initiated Contact with Patient 08/14/19 2256     (approximate)  I have reviewed the triage vital signs and the nursing notes.   HISTORY  Chief Complaint Weakness and Constipation    HPI Shane Grant is a 86 y.o. male with medical history as listed below who presents for evaluation of generalized weakness for 1 to 2 days.  His wife helps provide additional history because he has also developed confusion over the last 24 hours.  They live at [facility] and are generally independent but the patient has to use a special walker for assistance at all times.  However he has been increasingly weak and had a fall yesterday which did not result in injury  but since then he has been even more weak.  He is chronically incontinent of urine and bowel and he has urinated on himself multiple times over the last day but has not had the energy or strength to change himself or clean up after himself.  His wife notes that he has been constipated and not had a bowel movement for the last 5 days which is unusual.  He reports that he had an episode of sharp and severe abdominal pain about 2 days ago which has since resolved but he does feel some residual bloating even though he is no longer constipated.  He came in tonight because of the worsening weakness and inability to care for himself.  He and his wife states that he has not had much appetite and has not been eating or drinking very much over the last 1 to 2 days.  He has no shortness of breath or cough.  He denies fever, sore throat, nausea, and vomiting.  He has no burning when he is incontinent of urine.  He has no focal numbness or weakness, just a generalized weakness that makes it difficult for him to get up and around.  Overall the symptoms are severe and nothing in particular makes them better or worse.        Past  Medical History:  Diagnosis Date  . Colon cancer ([place]) 2003   Partial colectomy for cancer in polyp  . Diabetes mellitus   . Hyperlipidemia   . Hypertension ~1997  . Kidney stones 2000   lithotripsy  . Urge incontinence of urine     Patient Active Problem List   Diagnosis Date Noted  . BPH with obstruction/lower urinary tract symptoms 03/01/2019  . Leg ulcer ([place]) 03/01/2019  . Orthostatic dizziness 08/22/2018  . Aortic stenosis 06/04/2014  . Advanced directives, counseling/discussion 12/04/2013  . Routine general medical examination at a health care facility 10/03/2011  . Hypertension   . Hyperlipidemia   . Type 2 diabetes, controlled, with neuropathy ([place])   . History of colon cancer   . Kidney stones     Past Surgical History:  Procedure Laterality Date  . APPENDECTOMY  2003   with colectomy  . CATARACT EXTRACTION W/ INTRAOCULAR LENS  IMPLANT, BILATERAL Bilateral 11/14, 12/14   Dr George Ina  . Hepatic lesion  2011   Cryoablation by radiologist  . LITHOTRIPSY  2000  . PARTIAL COLECTOMY  2003   ascending colon  . TONSILLECTOMY AND ADENOIDECTOMY  1947    Prior to Admission medications   Medication Sig Start Date End Date Taking? Authorizing Provider  aspirin 81 MG tablet Take 81 mg by mouth daily.      [provider]  atorvastatin (LIPITOR) 80 MG tablet Take 1 tablet (80 mg total) by mouth daily. 03/22/19   Venia Carbon, MD  B-D ULTRAFINE III SHORT PEN 31G X 8 MM MISC USE AS DIRECTED 09/17/18   Venia Carbon, MD  glipiZIDE (GLUCOTROL) 5 MG tablet Take 1 tablet (5 mg total) by mouth 2 (two) times daily before a meal. 04/02/19   Venia Carbon, MD  Insulin Glargine (LANTUS SOLOSTAR) 100 UNIT/ML Solostar Pen INJECT 10 UNITS SUBCUTANEOUSLY ONCE DAILY 12/05/18   Venia Carbon, MD  metFORMIN (GLUCOPHAGE) 1000 MG tablet Take 1 tablet (1,000 mg total) by mouth 2 (two) times daily with a meal. 03/18/19   Venia Carbon, MD    Allergies Invokana  [canagliflozin] and Ancef [cefazolin sodium]  Family History  Problem Relation Age of Onset  . Cancer Brother        lung  . Heart disease Neg Hx   . Hypertension Neg Hx   . Diabetes Neg Hx     Social History Social History   Tobacco Use  . Smoking status: Former Smoker    Types: Cigars  . Smokeless tobacco: Never Used  Substance Use Topics  . Alcohol use: Yes    Comment: Glass of wine or scotch before dinner  . Drug use: No    Review of Systems Constitutional: No fever/chills.  General malaise and generalized weakness. Eyes: No visual changes. ENT: No sore throat. Cardiovascular: Denies chest pain. Respiratory: Denies shortness of breath. Gastrointestinal: Recent but not current abdominal pain.  No nausea, no vomiting.  No diarrhea.  +constipation.  Chronic bowel incontinence.  Decreased oral intake. Genitourinary: Chronic urinary incontinence.  Negative for dysuria. Musculoskeletal: Negative for neck pain.  Negative for back pain. Integumentary: Negative for rash. Neurological: Generalized weakness without specific focal weakness.  Negative for headaches, focal weakness or numbness.   ____________________________________________   PHYSICAL EXAM:  VITAL SIGNS: ED Triage Vitals  Enc Vitals Group     BP 08/14/19 1915 (!) 149/75     Pulse Rate 08/14/19 1915 80     Resp 08/14/19 1915 17     Temp 08/14/19 1915 98.5 F (36.9 C)     Temp Source 08/14/19 1915 Oral     SpO2 08/14/19 1915 97 %     Weight 08/14/19 1916 103.9 kg (229 lb)     Height 08/14/19 1916 1.981 m (_0 )     Head Circumference --      Peak Flow --      Pain Score 08/14/19 1916 0     Pain Loc --      Pain Edu? --      Excl. in [place]? --     Constitutional: Alert and oriented but a little bit confused about recent events. Eyes: Conjunctivae are normal.  Head: Atraumatic. Nose: No congestion/rhinnorhea. Mouth/Throat: Patient is wearing a mask.  Mucous membranes are dry. Neck: No stridor.  No  meningeal signs.   Cardiovascular: Normal rate, regular rhythm. Good peripheral circulation. Grossly normal heart sounds. Respiratory: Normal respiratory effort.  No retractions. Gastrointestinal: Soft and possibly mildly distended but only minimally so.  No specific tenderness to palpation. GU:   Musculoskeletal: No lower extremity tenderness nor edema. No gross deformities of extremities. Neurologic:  Normal speech and language. No gross focal neurologic deficits are appreciated. He is able to raise and lower his extremities against gravity and resistance, but is weak enough that he cannot lift himself out of bed.  No  facial droop or evidence of any other cranial nerve deficits. Skin:  Skin is warm, dry and intact.   ____________________________________________   LABS (all labs ordered are listed, but only abnormal results are displayed)  Labs Reviewed  BASIC METABOLIC PANEL - Abnormal; Notable for the following components:      Result Value   Glucose, Bld 223 (*)    BUN 40 (*)    Creatinine, Ser 2.26 (*)    GFR calc non Af Amer 25 (*)    GFR calc Af Amer 30 (*)    All other components within normal limits  CBC - Abnormal; Notable for the following components:   WBC 20.5 (*)    RBC 4.11 (*)    Hemoglobin 12.5 (*)    HCT 36.5 (*)    All other components within normal limits  URINALYSIS, COMPLETE (UACMP) WITH MICROSCOPIC - Abnormal; Notable for the following components:   Color, Urine YELLOW (*)    APPearance HAZY (*)    Hgb urine dipstick SMALL (*)    Protein, ur 30 (*)    Leukocytes,Ua TRACE (*)    All other components within normal limits  HEPATIC FUNCTION PANEL - Abnormal; Notable for the following components:   Total Bilirubin 1.4 (*)    Indirect Bilirubin 1.2 (*)    All other components within normal limits  SARS CORONAVIRUS 2 BY RT PCR (HOSPITAL ORDER, [place] [facility])  CULTURE, BLOOD (ROUTINE X 2)  CULTURE, BLOOD (ROUTINE X 2)  URINE CULTURE    LACTIC ACID, PLASMA  PROCALCITONIN  PROTIME-INR  LIPASE, BLOOD  MAGNESIUM  CK  TROPONIN I (HIGH SENSITIVITY)  TROPONIN I (HIGH SENSITIVITY)   ____________________________________________  EKG  ED ECG REPORT I, Hinda Kehr, the attending physician, personally viewed and interpreted this ECG.  Date: 08/14/2019 EKG Time: 19: 10 Rate: 82 Rhythm: normal sinus rhythm QRS Axis: normal Intervals: normal ST/T Wave abnormalities: normal Narrative Interpretation: no evidence of acute ischemia  ____________________________________________  RADIOLOGY I, Hinda Kehr, personally viewed and evaluated these images (plain radiographs) as part of my medical decision making, as well as reviewing the written report by the radiologist.  ED MD interpretation: No acute abnormalities identified on chest x-ray.  Age-indeterminate small vessel ischemic changes on the CT head without any evidence of acute CVA or bleed.  Official radiology report(s): CT ABDOMEN PELVIS WO CONTRAST  Result Date: 08/14/2019 CLINICAL DATA:  Abdominal pain, constipation and weakness for 5 days EXAM: CT ABDOMEN AND PELVIS WITHOUT CONTRAST TECHNIQUE: Multidetector CT imaging of the abdomen and pelvis was performed following the standard protocol without IV contrast. COMPARISON:  None. FINDINGS: Lower chest: Scattered tree in bud ground-glass airspace disease is seen within the right middle lobe and left lower lobe, consistent with infection or nonspecific inflammation. No effusion or pneumothorax. Hepatobiliary: Calcified gallstones are identified without cholecystitis. Liver is unremarkable. Pancreas: Unremarkable. No pancreatic ductal dilatation or surrounding inflammatory changes. Spleen: Normal in size without focal abnormality. Adrenals/Urinary Tract: Numerous calculi in a staghorn configuration are seen within the left kidney, measuring up to 3.4 cm in greatest dimension. There is an obstructing 6 mm proximal left ureteral  calculus with significant left-sided obstructive uropathy. Multiple nonobstructing right renal calculi are visualized measuring up to 7 mm in size. There are numerous bilateral simple renal cysts. Within the medial interpolar region of the right kidney there is a 3.3 cm indeterminate hypodensity with nonspecific central calcifications. Underlying renal cell carcinoma cannot be excluded, and nonemergent follow-up dedicated  renal MRI may be useful. The adrenals are unremarkable. The bladder is moderately distended without focal abnormality. Stomach/Bowel: No bowel obstruction or ileus. Postsurgical changes from right hemicolectomy. No bowel wall thickening or inflammatory change. Moderate stool within the rectal vault. Vascular/Lymphatic: Aortic atherosclerosis. No enlarged abdominal or pelvic lymph nodes. Reproductive: Prostate is unremarkable. Other: No abdominal wall hernia or abnormality. No abdominopelvic ascites. Musculoskeletal: No acute or destructive bony lesions. Reconstructed images demonstrate no additional findings. IMPRESSION: 1. Obstructing 6 mm proximal left ureteral calculus with significant left-sided obstructive uropathy. 2. Bilateral nonobstructing renal calculi. 3. A 3.3 cm indeterminate hypodensity right kidney. Underlying renal cell carcinoma cannot be excluded. Follow-up dedicated renal MRI may be useful. 4. Cholelithiasis without cholecystitis. 5. Scattered tree in bud ground-glass airspace disease within the right middle lobe and left lower lobe, consistent with infection or nonspecific inflammation. 6. Aortic Atherosclerosis (ICD10-I70.0). Electronically Signed   By: Randa Ngo M.D.   On: 08/14/2019 23:46   DG Chest 2 View  Result Date: 08/14/2019 CLINICAL DATA:  Constipation for 5 days, weakness EXAM: CHEST - 2 VIEW COMPARISON:  None. FINDINGS: Frontal and lateral views of the chest demonstrate an unremarkable cardiac silhouette. Atherosclerosis of the aortic arch. No acute airspace  disease, effusion, or pneumothorax. No acute bony abnormalities. IMPRESSION: 1. No acute intrathoracic process. Electronically Signed   By: Randa Ngo M.D.   On: 08/14/2019 19:52   CT Head Wo Contrast  Result Date: 08/14/2019 CLINICAL DATA:  Weakness, altered level of consciousness EXAM: CT HEAD WITHOUT CONTRAST TECHNIQUE: Contiguous axial images were obtained from the base of the skull through the vertex without intravenous contrast. COMPARISON:  None. FINDINGS: Brain: No acute infarct or hemorrhage. Scattered hypodensities are seen within the periventricular white matter consistent with age-indeterminate small vessel ischemic change. Lateral ventricles and midline structures are unremarkable. No acute extra-axial fluid collections. No mass effect. Vascular: No hyperdense vessel or unexpected calcification. Skull: Normal. Negative for fracture or focal lesion. Sinuses/Orbits: No acute finding. Other: None. IMPRESSION: 1. Age indeterminate small vessel ischemic change within the periventricular white matter. 2. Otherwise no acute process. Electronically Signed   By: Randa Ngo M.D.   On: 08/14/2019 19:55   CT Chest Wo Contrast  Result Date: 08/15/2019 CLINICAL DATA:  Altered level of consciousness, constipation for 5 days, weakness EXAM: CT CHEST WITHOUT CONTRAST TECHNIQUE: Multidetector CT imaging of the chest was performed following the standard protocol without IV contrast. COMPARISON:  08/14/2019 FINDINGS: Cardiovascular: Unenhanced images of the heart and great vessels demonstrates no pericardial effusion. Prominent calcification of the aortic valve. Extensive atherosclerosis of the aorta and coronary vessels. There is aneurysmal dilation of the ascending thoracic aorta measuring approximately 5 cm in diameter. Evaluation of the vascular lumen is limited without IV contrast. Mediastinum/Nodes: No enlarged mediastinal or axillary lymph nodes. Thyroid gland, trachea, and esophagus demonstrate no  significant findings. Lungs/Pleura: There is upper lobe predominant emphysema. Minimal bronchiectasis is identified, greatest at the lung bases. Minimal tree in bud airspace disease within the right upper, right middle, and left upper lobe likely sequela of chronic indolent infection such as MAC. No other airspace disease, effusion, or pneumothorax. The central airways are otherwise patent. Upper Abdomen: Numerous renal cysts are partially visualized. No acute upper abdominal findings. Musculoskeletal: No acute or destructive bony lesions. Reconstructed images demonstrate no additional findings. IMPRESSION: 1. Ascending thoracic aortic aneurysm measuring 5 cm in diameter. Recommend semi-annual imaging followup by CTA or MRA and referral to cardiothoracic surgery if not already obtained.  This recommendation follows 2010 ACCF/AHA/AATS/ACR/ASA/SCA/SCAI/SIR/STS/SVM Guidelines for the Diagnosis and Management of Patients With Thoracic Aortic Disease. Circulation. 2010; 121: X774-F423. Aortic aneurysm NOS (ICD10-I71.9) 2. Bronchiectasis with tree in bud airspace disease, likely sequela of chronic indolent infection such as MAC. 3. Aortic Atherosclerosis (ICD10-I70.0) and Emphysema (ICD10-J43.9). Electronically Signed   By: Randa Ngo M.D.   On: 08/15/2019 00:37    ____________________________________________   PROCEDURES   Procedure(s) performed (including Critical Care):  .1-3 Lead EKG Interpretation Performed by: Hinda Kehr, MD Authorized by: Hinda Kehr, MD     Interpretation: normal     ECG rate:  80   ECG rate assessment: normal     Rhythm: sinus rhythm     Ectopy: none     Conduction: normal       ____________________________________________   INITIAL IMPRESSION / MDM / ASSESSMENT AND PLAN / ED COURSE  As part of my medical decision making, I reviewed the following data within the [place] History obtained from family, Nursing notes reviewed and incorporated,  Labs reviewed , EKG interpreted , Old chart reviewed, Radiograph reviewed , Discussed with admitting physician  and Notes from prior ED visits   Differential diagnosis includes, but is not limited to, infection/sepsis of any given source, constipation/obstipation, acute renal failure, electrolyte or metabolic abnormality, bowel obstruction/ileus, CVA.  The patient is on the cardiac monitor to evaluate for evidence of arrhythmia and/or significant heart rate changes.  CT head, chest x-ray, and EKG show no evidence of acute abnormality.  Lab work is notable for a leukocytosis of 20.5 which is concerning even though he is afebrile and not tachycardic.  His basic metabolic panel indicates acute kidney injury/acute renal failure based on the BUN and creatinine and this is consistent with his overall presentation.  CK is normal with no evidence of rhabdomyolysis.  A number of labs have been added on to the original blood work including a lactic acid and procalcitonin.  High-sensitivity troponin is 10 and will be repeated.  Patient will need to have in and out catheterization for urine and I will obtain a CT abdomen and pelvis without contrast given my concern for intra-abdominal infection or obstructive process.  1 L of lactated Ringer's IV bolus has been ordered for his volume depletion and possible sepsis even though he does not currently meet SIRS criteria.  I discussed my plan with the patient and his wife which includes admission for his acute renal issues and/or infection and they understand and agree with the plan.     Clinical Course as of Aug 14 125  Thu Aug 15, 2019  0018 Troponin I (High Sensitivity): 14 [CF]  0018 Lactic Acid, Venous: 1.3 [CF]  9532 Given the complicated clinical setting, including evidence of pneumonia on CT scan, WBC 20, some degree of delirium, and obstructive ureteral stone, I am treating empirically with broad-spectrum antibiotics including cefepime 2 g IV for the  complicated UTI possibility as well as azithromycin 500 mg IV for the lung infection.  Awaiting results of urinalysis and CT chest.   [CF]  0042 trace leukocytes, some red and white cells, nitrite negative.  Paging Dr. Diamantina Providence with urology to discuss the possible need for emergent ureteral stent  Urinalysis, Complete w Microscopic(!) [CF]  0043 chronic indolent infection such as MAC more likely, but CT chest does indicate some degree of infection  CT Chest Wo Contrast [CF]  0046 SARS Coronavirus 2: NEGATIVE [CF]  0101 Procalcitonin: 0.30 [CF]  0108 I  spoke by phone with Dr. Diamantina Providence.  We discussed the case in detail and he feels the patient does not need emergent urethral stenting but said that he agrees with the plan for empiric antibiotics, IV fluids, and admission to the hospitalist service.  He will see the patient first thing in the morning and I sent him the patient's chart through [facility] so that he would have it immediately available.  I am awaiting callback from the hospitalist.   [CF]  0115 The patient does not meet criteria for severe sepsis and thus I will not order 30 mL/kg of IV fluids.  His pressure is stable.  I am ordering normal saline IV infusion at 125 mL/h to begin after the 1 L normal saline bolus.   [CF]  0119 Discussed case with Dr. Sidney Ace with the hospitalist service who will admit.   [CF]  0120 Updated patient and family.   [CF]  0120 Of note, patient does not meet sepsis criteria since he only has a leukocytosis, though he may develop other indicators later.   [CF]    Clinical Course User Index [CF] Hinda Kehr, MD     ____________________________________________  FINAL CLINICAL IMPRESSION(S) / ED DIAGNOSES  Final diagnoses:  Acute renal failure, unspecified acute renal failure type ([place])  Generalized weakness  Ureteral stone with hydronephrosis  Chronic pneumonia  Leukocytosis, unspecified type     MEDICATIONS GIVEN DURING THIS VISIT:  Medications    iohexol (OMNIPAQUE) 9 MG/ML oral solution 500 mL ( Oral Canceled Entry 08/15/19 0121)  ondansetron (ZOFRAN) injection 4 mg (0 mg Intravenous Hold 08/15/19 0048)  azithromycin (ZITHROMAX) 500 mg in sodium chloride 0.9 % 250 mL IVPB (500 mg Intravenous New Bag/Given 08/15/19 0047)  ceFEPIme (MAXIPIME) 2 g in sodium chloride 0.9 % 100 mL IVPB (has no administration in time range)  0.9 %  sodium chloride infusion (has no administration in time range)  lactated ringers bolus 1,000 mL (0 mLs Intravenous Stopped 08/15/19 0038)  ceFEPIme (MAXIPIME) 2 g in sodium chloride 0.9 % 100 mL IVPB (0 g Intravenous Stopped 08/15/19 0121)     ED Discharge Orders    None      *Please note:  Sayan Aldava was evaluated in Emergency Department on 08/15/2019 for the symptoms described in the history of present illness. He was evaluated in the context of the global COVID-19 pandemic, which necessitated consideration that the patient might be at risk for infection with the SARS-CoV-2 virus that causes COVID-19. Institutional protocols and algorithms that pertain to the evaluation of patients at risk for COVID-19 are in a state of rapid change based on information released by regulatory bodies including the CDC and federal and state organizations. These policies and algorithms were followed during the patient's care in the ED.  Some ED evaluations and interventions may be delayed as a result of limited staffing during and after the pandemic.*  Note:  This document was prepared using Dragon voice recognition software and may include unintentional dictation errors.   Hinda Kehr, MD 08/15/19 [phone]

## 2019-08-15 ENCOUNTER — Inpatient Hospital Stay: Payer: Medicare Other | Admitting: Anesthesiology

## 2019-08-15 ENCOUNTER — Inpatient Hospital Stay: Payer: Medicare Other

## 2019-08-15 ENCOUNTER — Ambulatory Visit: Payer: Medicare Other | Admitting: Internal Medicine

## 2019-08-15 ENCOUNTER — Emergency Department: Payer: Medicare Other

## 2019-08-15 ENCOUNTER — Encounter
Admission: EM | Disposition: A | Discharge status: Home Health | Payer: Self-pay | Source: Skilled Nursing Facility | Attending: Internal Medicine

## 2019-08-15 ENCOUNTER — Other Ambulatory Visit: Payer: Self-pay

## 2019-08-15 ENCOUNTER — Encounter: Payer: Self-pay | Admitting: Family Medicine

## 2019-08-15 DIAGNOSIS — E1122 Type 2 diabetes mellitus with diabetic chronic kidney disease: Secondary | ICD-10-CM | POA: Diagnosis present

## 2019-08-15 DIAGNOSIS — N200 Calculus of kidney: Secondary | ICD-10-CM | POA: Diagnosis not present

## 2019-08-15 DIAGNOSIS — Z79899 Other long term (current) drug therapy: Secondary | ICD-10-CM | POA: Diagnosis not present

## 2019-08-15 DIAGNOSIS — I129 Hypertensive chronic kidney disease with stage 1 through stage 4 chronic kidney disease, or unspecified chronic kidney disease: Secondary | ICD-10-CM | POA: Diagnosis present

## 2019-08-15 DIAGNOSIS — D72829 Elevated white blood cell count, unspecified: Secondary | ICD-10-CM | POA: Diagnosis not present

## 2019-08-15 DIAGNOSIS — N139 Obstructive and reflux uropathy, unspecified: Secondary | ICD-10-CM | POA: Diagnosis present

## 2019-08-15 DIAGNOSIS — I350 Nonrheumatic aortic (valve) stenosis: Secondary | ICD-10-CM | POA: Diagnosis present

## 2019-08-15 DIAGNOSIS — K5900 Constipation, unspecified: Secondary | ICD-10-CM | POA: Diagnosis present

## 2019-08-15 DIAGNOSIS — E1140 Type 2 diabetes mellitus with diabetic neuropathy, unspecified: Secondary | ICD-10-CM | POA: Diagnosis not present

## 2019-08-15 DIAGNOSIS — N210 Calculus in bladder: Secondary | ICD-10-CM | POA: Diagnosis not present

## 2019-08-15 DIAGNOSIS — Z20822 Contact with and (suspected) exposure to covid-19: Secondary | ICD-10-CM | POA: Diagnosis present

## 2019-08-15 DIAGNOSIS — E1165 Type 2 diabetes mellitus with hyperglycemia: Secondary | ICD-10-CM | POA: Diagnosis present

## 2019-08-15 DIAGNOSIS — N179 Acute kidney failure, unspecified: Secondary | ICD-10-CM

## 2019-08-15 DIAGNOSIS — E1151 Type 2 diabetes mellitus with diabetic peripheral angiopathy without gangrene: Secondary | ICD-10-CM | POA: Diagnosis present

## 2019-08-15 DIAGNOSIS — N39490 Overflow incontinence: Secondary | ICD-10-CM | POA: Diagnosis present

## 2019-08-15 DIAGNOSIS — N1831 Chronic kidney disease, stage 3a: Secondary | ICD-10-CM | POA: Diagnosis present

## 2019-08-15 DIAGNOSIS — M79604 Pain in right leg: Secondary | ICD-10-CM | POA: Diagnosis not present

## 2019-08-15 DIAGNOSIS — N136 Pyonephrosis: Secondary | ICD-10-CM | POA: Diagnosis present

## 2019-08-15 DIAGNOSIS — E785 Hyperlipidemia, unspecified: Secondary | ICD-10-CM | POA: Diagnosis present

## 2019-08-15 DIAGNOSIS — K8020 Calculus of gallbladder without cholecystitis without obstruction: Secondary | ICD-10-CM | POA: Diagnosis not present

## 2019-08-15 DIAGNOSIS — J470 Bronchiectasis with acute lower respiratory infection: Secondary | ICD-10-CM

## 2019-08-15 DIAGNOSIS — E869 Volume depletion, unspecified: Secondary | ICD-10-CM | POA: Diagnosis present

## 2019-08-15 DIAGNOSIS — Z87442 Personal history of urinary calculi: Secondary | ICD-10-CM | POA: Diagnosis not present

## 2019-08-15 DIAGNOSIS — Z85038 Personal history of other malignant neoplasm of large intestine: Secondary | ICD-10-CM | POA: Diagnosis not present

## 2019-08-15 DIAGNOSIS — Z794 Long term (current) use of insulin: Secondary | ICD-10-CM | POA: Diagnosis not present

## 2019-08-15 DIAGNOSIS — N211 Calculus in urethra: Secondary | ICD-10-CM | POA: Diagnosis present

## 2019-08-15 DIAGNOSIS — Z87891 Personal history of nicotine dependence: Secondary | ICD-10-CM | POA: Diagnosis not present

## 2019-08-15 DIAGNOSIS — I712 Thoracic aortic aneurysm, without rupture: Secondary | ICD-10-CM | POA: Diagnosis not present

## 2019-08-15 DIAGNOSIS — R531 Weakness: Secondary | ICD-10-CM | POA: Diagnosis not present

## 2019-08-15 DIAGNOSIS — N189 Chronic kidney disease, unspecified: Secondary | ICD-10-CM

## 2019-08-15 DIAGNOSIS — J479 Bronchiectasis, uncomplicated: Secondary | ICD-10-CM | POA: Diagnosis not present

## 2019-08-15 DIAGNOSIS — N201 Calculus of ureter: Secondary | ICD-10-CM | POA: Diagnosis not present

## 2019-08-15 DIAGNOSIS — Z9049 Acquired absence of other specified parts of digestive tract: Secondary | ICD-10-CM | POA: Diagnosis not present

## 2019-08-15 DIAGNOSIS — R159 Full incontinence of feces: Secondary | ICD-10-CM | POA: Diagnosis present

## 2019-08-15 DIAGNOSIS — Z7982 Long term (current) use of aspirin: Secondary | ICD-10-CM | POA: Diagnosis not present

## 2019-08-15 DIAGNOSIS — J439 Emphysema, unspecified: Secondary | ICD-10-CM | POA: Diagnosis not present

## 2019-08-15 DIAGNOSIS — Z8679 Personal history of other diseases of the circulatory system: Secondary | ICD-10-CM | POA: Diagnosis not present

## 2019-08-15 DIAGNOSIS — I700 Atherosclerosis of aorta: Secondary | ICD-10-CM | POA: Diagnosis not present

## 2019-08-15 HISTORY — PX: CYSTOSCOPY WITH LITHOLAPAXY: SHX1425

## 2019-08-15 HISTORY — PX: CYSTOSCOPY WITH STENT PLACEMENT: SHX5790

## 2019-08-15 HISTORY — PX: CYSTOSCOPY W/ RETROGRADES: SHX1426

## 2019-08-15 LAB — HEPATIC FUNCTION PANEL
ALT: 37 U/L (ref 0–44)
AST: 30 U/L (ref 15–41)
Albumin: 3.6 g/dL (ref 3.5–5.0)
Alkaline Phosphatase: 105 U/L (ref 38–126)
Bilirubin, Direct: 0.2 mg/dL (ref 0.0–0.2)
Indirect Bilirubin: 1.2 mg/dL — ABNORMAL HIGH (ref 0.3–0.9)
Total Bilirubin: 1.4 mg/dL — ABNORMAL HIGH (ref 0.3–1.2)
Total Protein: 7.4 g/dL (ref 6.5–8.1)

## 2019-08-15 LAB — URINALYSIS, COMPLETE (UACMP) WITH MICROSCOPIC
Bacteria, UA: NONE SEEN
Bilirubin Urine: NEGATIVE
Glucose, UA: NEGATIVE mg/dL
Ketones, ur: NEGATIVE mg/dL
Nitrite: NEGATIVE
Protein, ur: 30 mg/dL — AB
Specific Gravity, Urine: 1.015 (ref 1.005–1.030)
Squamous Epithelial / HPF: NONE SEEN (ref 0–5)
pH: 5 (ref 5.0–8.0)

## 2019-08-15 LAB — BASIC METABOLIC PANEL
Anion gap: 7 (ref 5–15)
BUN: 40 mg/dL — ABNORMAL HIGH (ref 8–23)
CO2: 27 mmol/L (ref 22–32)
Calcium: 9.4 mg/dL (ref 8.9–10.3)
Chloride: 103 mmol/L (ref 98–111)
Creatinine, Ser: 2.09 mg/dL — ABNORMAL HIGH (ref 0.61–1.24)
GFR calc Af Amer: 32 mL/min — ABNORMAL LOW (ref >60)
GFR calc non Af Amer: 28 mL/min — ABNORMAL LOW (ref >60)
Glucose, Bld: 167 mg/dL — ABNORMAL HIGH (ref 70–99)
Potassium: 4.7 mmol/L (ref 3.5–5.1)
Sodium: 137 mmol/L (ref 135–145)

## 2019-08-15 LAB — PROCALCITONIN: Procalcitonin: 0.3 ng/mL

## 2019-08-15 LAB — GLUCOSE, CAPILLARY
Glucose-Capillary: 144 mg/dL — ABNORMAL HIGH (ref 70–99)
Glucose-Capillary: 164 mg/dL — ABNORMAL HIGH (ref 70–99)
Glucose-Capillary: 130 mg/dL — ABNORMAL HIGH (ref 70–99)
Glucose-Capillary: 118 mg/dL — ABNORMAL HIGH (ref 70–99)
Glucose-Capillary: 123 mg/dL — ABNORMAL HIGH (ref 70–99)
Glucose-Capillary: 190 mg/dL — ABNORMAL HIGH (ref 70–99)

## 2019-08-15 LAB — CBC
HCT: 33.4 % — ABNORMAL LOW (ref 39.0–52.0)
Hemoglobin: 11 g/dL — ABNORMAL LOW (ref 13.0–17.0)
MCH: 29.7 pg (ref 26.0–34.0)
MCHC: 32.9 g/dL (ref 30.0–36.0)
MCV: 90.3 fL (ref 80.0–100.0)
Platelets: 230 10*3/uL (ref 150–400)
RBC: 3.7 MIL/uL — ABNORMAL LOW (ref 4.22–5.81)
RDW: 12.9 % (ref 11.5–15.5)
WBC: 16.8 10*3/uL — ABNORMAL HIGH (ref 4.0–10.5)
nRBC: 0 % (ref 0.0–0.2)

## 2019-08-15 LAB — MAGNESIUM: Magnesium: 2.3 mg/dL (ref 1.7–2.4)

## 2019-08-15 LAB — SARS CORONAVIRUS 2 BY RT PCR (HOSPITAL ORDER, PERFORMED IN ~~LOC~~ HOSPITAL LAB): SARS Coronavirus 2: NEGATIVE

## 2019-08-15 LAB — TROPONIN I (HIGH SENSITIVITY): Troponin I (High Sensitivity): 14 ng/L (ref <18)

## 2019-08-15 LAB — LACTIC ACID, PLASMA: Lactic Acid, Venous: 1.3 mmol/L (ref 0.5–1.9)

## 2019-08-15 LAB — LIPASE, BLOOD: Lipase: 25 U/L (ref 11–51)

## 2019-08-15 SURGERY — CYSTOSCOPY, WITH STENT INSERTION
Anesthesia: General | Laterality: Left

## 2019-08-15 MED ORDER — FENTANYL CITRATE (PF) 100 MCG/2ML IJ SOLN
INTRAMUSCULAR | Status: AC
  Filled 2019-08-15: qty 2

## 2019-08-15 MED ORDER — SODIUM CHLORIDE 0.9 % IV SOLN: 2.0000 g | Freq: Two times a day (BID) | INTRAVENOUS | Status: DC

## 2019-08-15 MED ORDER — SODIUM CHLORIDE 0.9 % IV SOLN
2.0000 g | Freq: Once | INTRAVENOUS | Status: AC
  Administered 2019-08-15: 2 g via INTRAVENOUS
  Filled 2019-08-15: qty 2

## 2019-08-15 MED ORDER — ONDANSETRON HCL 4 MG PO TABS: 4.0000 mg | ORAL_TABLET | Freq: Four times a day (QID) | ORAL | Status: DC | PRN

## 2019-08-15 MED ORDER — ACETAMINOPHEN 650 MG RE SUPP: 650.0000 mg | Freq: Four times a day (QID) | RECTAL | Status: DC | PRN

## 2019-08-15 MED ORDER — PHENYLEPHRINE HCL (PRESSORS) 10 MG/ML IV SOLN
INTRAVENOUS | Status: DC | PRN
  Administered 2019-08-15 (×4): 50 ug via INTRAVENOUS

## 2019-08-15 MED ORDER — PROPOFOL 10 MG/ML IV BOLUS
INTRAVENOUS | Status: AC
  Filled 2019-08-15: qty 20

## 2019-08-15 MED ORDER — IPRATROPIUM-ALBUTEROL 0.5-2.5 (3) MG/3ML IN SOLN: 3.0000 mL | RESPIRATORY_TRACT | Status: DC | PRN

## 2019-08-15 MED ORDER — ACETAMINOPHEN 325 MG PO TABS: 650.0000 mg | ORAL_TABLET | Freq: Four times a day (QID) | ORAL | Status: DC | PRN

## 2019-08-15 MED ORDER — PROPOFOL 10 MG/ML IV BOLUS
INTRAVENOUS | Status: DC | PRN
  Administered 2019-08-15: 30 mg via INTRAVENOUS
  Administered 2019-08-15: 120 mg via INTRAVENOUS

## 2019-08-15 MED ORDER — SODIUM CHLORIDE 0.9 % IV SOLN: Freq: Once | INTRAVENOUS | Status: DC

## 2019-08-15 MED ORDER — CHLORHEXIDINE GLUCONATE CLOTH 2 % EX PADS: 6.0000 | MEDICATED_PAD | Freq: Every day | CUTANEOUS | Status: DC

## 2019-08-15 MED ORDER — LIDOCAINE HCL (PF) 2 % IJ SOLN
INTRAMUSCULAR | Status: AC
  Filled 2019-08-15: qty 5

## 2019-08-15 MED ORDER — GUAIFENESIN ER 600 MG PO TB12
600.0000 mg | ORAL_TABLET | Freq: Two times a day (BID) | ORAL | Status: DC
  Administered 2019-08-15 – 2019-08-16 (×3): 600 mg via ORAL
  Filled 2019-08-15 (×3): qty 1

## 2019-08-15 MED ORDER — SODIUM CHLORIDE 0.9 % IV SOLN
500.0000 mg | INTRAVENOUS | Status: DC
  Administered 2019-08-15: 500 mg via INTRAVENOUS
  Filled 2019-08-15 (×2): qty 500

## 2019-08-15 MED ORDER — FENTANYL CITRATE (PF) 100 MCG/2ML IJ SOLN: 25.0000 ug | INTRAMUSCULAR | Status: DC | PRN

## 2019-08-15 MED ORDER — SODIUM CHLORIDE 0.9 % IV SOLN: INTRAVENOUS | Status: DC

## 2019-08-15 MED ORDER — ENOXAPARIN SODIUM 40 MG/0.4ML ~~LOC~~ SOLN
40.0000 mg | SUBCUTANEOUS | Status: DC
  Administered 2019-08-15: 40 mg via SUBCUTANEOUS
  Filled 2019-08-15: qty 0.4

## 2019-08-15 MED ORDER — GLIPIZIDE 5 MG PO TABS: 5.0000 mg | ORAL_TABLET | Freq: Two times a day (BID) | ORAL | Status: DC

## 2019-08-15 MED ORDER — OXYCODONE HCL 5 MG/5ML PO SOLN: 5.0000 mg | Freq: Once | ORAL | Status: DC | PRN

## 2019-08-15 MED ORDER — INSULIN GLARGINE 100 UNIT/ML ~~LOC~~ SOLN
10.0000 [IU] | Freq: Every day | SUBCUTANEOUS | Status: DC
  Filled 2019-08-15 (×2): qty 0.1

## 2019-08-15 MED ORDER — OXYCODONE HCL 5 MG PO TABS: 5.0000 mg | ORAL_TABLET | Freq: Once | ORAL | Status: DC | PRN

## 2019-08-15 MED ORDER — ONDANSETRON HCL 4 MG/2ML IJ SOLN
4.0000 mg | INTRAMUSCULAR | Status: AC
  Filled 2019-08-15: qty 2

## 2019-08-15 MED ORDER — INSULIN ASPART 100 UNIT/ML ~~LOC~~ SOLN
0.0000 [IU] | Freq: Three times a day (TID) | SUBCUTANEOUS | Status: DC
  Administered 2019-08-15: 2 [IU] via SUBCUTANEOUS
  Administered 2019-08-15: 1 [IU] via SUBCUTANEOUS
  Administered 2019-08-16 (×2): 2 [IU] via SUBCUTANEOUS
  Filled 2019-08-15 (×6): qty 1

## 2019-08-15 MED ORDER — LACTATED RINGERS IV SOLN: INTRAVENOUS | Status: DC | PRN

## 2019-08-15 MED ORDER — ATORVASTATIN CALCIUM 20 MG PO TABS
80.0000 mg | ORAL_TABLET | Freq: Every day | ORAL | Status: DC
  Administered 2019-08-15: 80 mg via ORAL
  Filled 2019-08-15: qty 4

## 2019-08-15 MED ORDER — ONDANSETRON HCL 4 MG/2ML IJ SOLN
INTRAMUSCULAR | Status: DC | PRN
  Administered 2019-08-15: 4 mg via INTRAVENOUS

## 2019-08-15 MED ORDER — ENOXAPARIN SODIUM 40 MG/0.4ML ~~LOC~~ SOLN
40.0000 mg | SUBCUTANEOUS | Status: DC
  Administered 2019-08-16: 40 mg via SUBCUTANEOUS
  Filled 2019-08-15: qty 0.4

## 2019-08-15 MED ORDER — ONDANSETRON HCL 4 MG/2ML IJ SOLN: 4.0000 mg | Freq: Four times a day (QID) | INTRAMUSCULAR | Status: DC | PRN

## 2019-08-15 MED ORDER — TRAZODONE HCL 50 MG PO TABS: 25.0000 mg | ORAL_TABLET | Freq: Every evening | ORAL | Status: DC | PRN

## 2019-08-15 MED ORDER — SODIUM CHLORIDE 0.9 % IV SOLN
1.0000 g | Freq: Two times a day (BID) | INTRAVENOUS | Status: DC
  Administered 2019-08-15 – 2019-08-16 (×3): 1 g via INTRAVENOUS
  Filled 2019-08-15 (×5): qty 1

## 2019-08-15 MED ORDER — MAGNESIUM HYDROXIDE 400 MG/5ML PO SUSP: 30.0000 mL | Freq: Every day | ORAL | Status: DC | PRN

## 2019-08-15 MED ORDER — IOHEXOL 180 MG/ML  SOLN
INTRAMUSCULAR | Status: DC | PRN
  Administered 2019-08-15: 15 mL

## 2019-08-15 MED ORDER — FENTANYL CITRATE (PF) 100 MCG/2ML IJ SOLN
INTRAMUSCULAR | Status: DC | PRN
  Administered 2019-08-15 (×2): 25 ug via INTRAVENOUS

## 2019-08-15 MED ORDER — LIDOCAINE HCL (CARDIAC) PF 100 MG/5ML IV SOSY
PREFILLED_SYRINGE | INTRAVENOUS | Status: DC | PRN
  Administered 2019-08-15: 80 mg via INTRAVENOUS

## 2019-08-15 MED ORDER — SODIUM CHLORIDE 0.9 % IV SOLN
500.0000 mg | INTRAVENOUS | Status: DC
  Administered 2019-08-15: 500 mg via INTRAVENOUS
  Filled 2019-08-15: qty 500

## 2019-08-15 MED ORDER — ASPIRIN EC 81 MG PO TBEC
81.0000 mg | DELAYED_RELEASE_TABLET | Freq: Every day | ORAL | Status: DC
  Administered 2019-08-15 – 2019-08-16 (×2): 81 mg via ORAL
  Filled 2019-08-15 (×2): qty 1

## 2019-08-15 SURGICAL SUPPLY — 27 items
BAG DRAIN CYSTO-URO LG1000N (MISCELLANEOUS) ×4 IMPLANT
BAG URINE DRAIN 2000ML AR STRL (UROLOGICAL SUPPLIES) ×4 IMPLANT
BRUSH SCRUB EZ 1% IODOPHOR (MISCELLANEOUS) IMPLANT
CATH COUDE FOLEY 2W 5CC 18FR (CATHETERS) ×4 IMPLANT
CATH FOLEY 2W COUNCIL 20FR 5CC (CATHETERS) IMPLANT
CATH URETL 5X70 OPEN END (CATHETERS) ×4 IMPLANT
CONRAY 43 FOR UROLOGY 50M (MISCELLANEOUS) ×4 IMPLANT
FIBER LASER FLEXIVA 550 (UROLOGICAL SUPPLIES) ×4 IMPLANT
GLOVE BIOGEL PI IND STRL 7.5 (GLOVE) ×2 IMPLANT
GLOVE BIOGEL PI INDICATOR 7.5 (GLOVE) ×2
GOWN STRL REUS W/ TWL LRG LVL3 (GOWN DISPOSABLE) ×2 IMPLANT
GOWN STRL REUS W/ TWL XL LVL3 (GOWN DISPOSABLE) ×2 IMPLANT
GOWN STRL REUS W/TWL LRG LVL3 (GOWN DISPOSABLE) ×2
GOWN STRL REUS W/TWL XL LVL3 (GOWN DISPOSABLE) ×2
GUIDEWIRE STR DUAL SENSOR (WIRE) ×4 IMPLANT
HOLDER FOLEY CATH W/STRAP (MISCELLANEOUS) ×4 IMPLANT
KIT TURNOVER CYSTO (KITS) ×4 IMPLANT
PACK CYSTO AR (MISCELLANEOUS) ×4 IMPLANT
SET CYSTO W/LG BORE CLAMP LF (SET/KITS/TRAYS/PACK) ×4 IMPLANT
SOL .9 NS 3000ML IRR  AL (IV SOLUTION) ×4
SOL .9 NS 3000ML IRR UROMATIC (IV SOLUTION) ×4 IMPLANT
STENT URET 6FRX24 CONTOUR (STENTS) IMPLANT
STENT URET 6FRX26 CONTOUR (STENTS) IMPLANT
STENT URET 6FRX30 CONTOUR (STENTS) ×4 IMPLANT
SURGILUBE 2OZ TUBE FLIPTOP (MISCELLANEOUS) ×4 IMPLANT
SYRINGE IRR TOOMEY STRL 70CC (SYRINGE) ×4 IMPLANT
WATER STERILE IRR 1000ML POUR (IV SOLUTION) ×4 IMPLANT

## 2019-08-15 NOTE — Progress Notes (Signed)
  PROGRESS NOTE    Shane Grant  JSI:[id number] DOB: 31-May-1931 DOA: 08/14/2019  PCP: Venia Carbon, MD    LOS - 0    Patient admitted earlier this morning with obstructive uropathy secondary to a left ureteral stone.  Patient was taken to OR with urology today.  Stone was successfully removed and left renal stent was placed.    Interval subjective: Patient reports feeling okay, sleepy.  No acute complaints.  Pain currently controlled.   I have reviewed the full H&P by Dr. Sidney Ace in detail, and I agree with the assessment and plan as outlined therein. In addition: --Maintain Foley overnight --Urology to see in AM   No Charge    Ezekiel Slocumb, DO [facility]   If 7PM-7AM, please contact night-coverage [email or web address] 08/15/2019, 3:37 PM

## 2019-08-15 NOTE — Anesthesia Procedure Notes (Signed)
Procedure Name: LMA Insertion Date/Time: 08/15/2019 12:53 PM Performed by: Jerrye Noble, CRNA Pre-anesthesia Checklist: Patient identified, Emergency Drugs available, Suction available and Patient being monitored Patient Re-evaluated:Patient Re-evaluated prior to induction Oxygen Delivery Method: Circle system utilized Preoxygenation: Pre-oxygenation with 100% oxygen Induction Type: IV induction Ventilation: Mask ventilation without difficulty LMA: LMA inserted LMA Size: 4.5 Number of attempts: 1 Placement Confirmation: positive ETCO2 and breath sounds checked- equal and bilateral Tube secured with: Tape Dental Injury: Teeth and Oropharynx as per pre-operative assessment

## 2019-08-15 NOTE — ED Notes (Signed)
Call bell in reach. Lights dimmed and bed locked and in lowest position. Pt in NAD at this time.

## 2019-08-15 NOTE — ED Notes (Signed)
Admitting MD at bedside.

## 2019-08-15 NOTE — Anesthesia Postprocedure Evaluation (Signed)
Anesthesia Post Note  Patient: Shane Grant  Procedure(s) Performed: CYSTOSCOPY WITH STENT PLACEMENT (Left ) CYSTOSCOPY WITH RETROGRADE PYELOGRAM (Left ) CYSTOSCOPY WITH LITHOLAPAXY  Patient location during evaluation: PACU Anesthesia Type: General Level of consciousness: awake and alert Pain management: pain level controlled Vital Signs Assessment: post-procedure vital signs reviewed and stable Respiratory status: spontaneous breathing, nonlabored ventilation, respiratory function stable and patient connected to nasal cannula oxygen Cardiovascular status: blood pressure returned to baseline and stable Postop Assessment: no apparent nausea or vomiting Anesthetic complications: no   No complications documented.   Last Vitals:  Vitals:   08/15/19 1419 08/15/19 1501  BP: 131/78 131/77  Pulse: 66 65  Resp: 18 18  Temp:  36.6 C  SpO2: 95% 100%    Last Pain:  Vitals:   08/15/19 1501  TempSrc: Oral  PainSc:                  Precious Haws Topacio Cella

## 2019-08-15 NOTE — Progress Notes (Signed)
Urology Consult   I have been asked to see the patient by Dr. Arbutus Ped, for evaluation and management of weakness and left ureteral stone.  Chief Complaint: weakness  HPI:  Shane Grant is a 86 y.o. year old M with history of nephrolithiasis treated with lithotripsy >10 years ago who presents with 1-2 days of worsening weakness. He denies any fevers, chills, dysuria, or flank/abdominal pain. CT shows a partial left staghorn stone in the lower pole extending into the renal pelvis, and a 16mm left proximal ureteral with upstream hydronephrosis. Urinalysis relatively benign with 6-10 RBCs, 6-10 WBCs, no bacteria, trace leukocytes, nitrite negative. Leukocytosis to 20.5k and AKI with sCr of 2.26 from baseline of 1.4, both improved overnight with hydration.  No history of UTI. Afebrile and HDS.  He has baseline urinary incontinence for many years, likely overflow incontinence based on significantly distended bladder on CT.   PMH: Past Medical History:  Diagnosis Date  . Colon cancer ([place]) 2003   Partial colectomy for cancer in polyp  . Diabetes mellitus   . Hyperlipidemia   . Hypertension ~1997  . Kidney stones 2000   lithotripsy  . Urge incontinence of urine     Surgical History: Past Surgical History:  Procedure Laterality Date  . APPENDECTOMY  2003   with colectomy  . CATARACT EXTRACTION W/ INTRAOCULAR LENS  IMPLANT, BILATERAL Bilateral 11/14, 12/14   Dr George Ina  . Hepatic lesion  2011   Cryoablation by radiologist  . LITHOTRIPSY  2000  . PARTIAL COLECTOMY  2003   ascending colon  . TONSILLECTOMY AND ADENOIDECTOMY  1947      Allergies:  Allergies  Allergen Reactions  . Invokana [Canagliflozin]     Raised sugars and he felt bad  . Ancef [Cefazolin Sodium] Nausea Only    Family History: Family History  Problem Relation Age of Onset  . Cancer Brother        lung  . Heart disease Neg Hx   . Hypertension Neg Hx   . Diabetes Neg Hx     Social History:   reports that he has quit smoking. His smoking use included cigars. He has never used smokeless tobacco. He reports current alcohol use. He reports that he does not use drugs.  ROS: Negative aside from those stated in the HPI.  Physical Exam: BP 128/73 (BP Location: Right Arm)   Pulse 76   Temp 98.5 F (36.9 C) (Oral)   Resp 16   Ht 6\' 6"  (1.981 m)   Wt 103.9 kg   SpO2 95%   BMI 26.46 kg/m    Constitutional:  Alert and oriented, No acute distress. Cardiovascular:RRR Respiratory:CTA bilaterally GI: Abdomen is soft, nontender, nondistended, no abdominal masses GU: No CVA tenderness  Laboratory Data: Reviewed, see HPI  Pertinent Imaging: I have personally reviewed the CT, see HPI.  Assessment & Plan:   86 yo M admitted with weakness and 6mm left proximal ureteral stone and partial left staghorn stone, urinalysis relatively benign and afebrile, but persistent leukocytosis to 16k and AKI. Recommend ureteral stent placement and foley.  We discussed the need for drainage in the setting of a possibly infected and obstructed system with leukocytosis and AKI.  A ureteral stent is a small plastic tube that is placed cystoscopically with one end in the kidney and the other end in the bladder that allows the infection from the kidney to drain, and relieves pain from the obstructing stone.  We discussed the risks at  length including bleeding, infection, sepsis, death, ureteral injury, and stent related symptoms including urgency/frequency/dysuria/flank pain/gross hematuria.  There is a low, but not 0, risk of inability to pass the ureteral stent alongside the stone from below which would require percutaneous nephrostomy tube by interventional radiology.  Finally, we discussed possible prolonged hospitalization and recovery, possible temporary Foley catheter placement, and 10 to 14-day course of antibiotics.  We reviewed the need for a follow-up procedure for definitive management of their stone  when the infection has been treated in 2 to 3 weeks with either staged ureteroscopy/laser lithotripsy or PCNL. Stone may be uric acid with urine pH of 5 and stone density ~600, consider dissolution therapy for staghorn stone with alkalinization pending stone type.  Recommendations: Continue abx, follow up cultures Cystoscopy and left ureteral stent today Will also need further evaluation with PVR in follow up IO:[id number] bladder and chronic likely overflow incontinence.   Billey Co, MD  Total time spent on the floor was 80 minutes, with greater than 50% spent in counseling and coordination of care with the patient regarding left ureteral stone, staghorn stone, possible UTI, AKI.  [facility] [address] [place], [place] [address] [phone]

## 2019-08-15 NOTE — Telephone Encounter (Signed)
I cancelled his appt as requested.

## 2019-08-15 NOTE — Anesthesia Preprocedure Evaluation (Addendum)
Anesthesia Evaluation  Patient identified by MRN, date of birth, ID band Patient awake    Reviewed: Allergy & Precautions, H&P , NPO status , Patient's Chart, lab work & pertinent test results  Airway Mallampati: III  TM Distance: <3 FB Neck ROM: limited    Dental  (+) Chipped   Pulmonary neg pulmonary ROS, former smoker,    Pulmonary exam normal        Cardiovascular hypertension, negative cardio ROS Normal cardiovascular exam     Neuro/Psych negative neurological ROS  negative psych ROS   GI/Hepatic negative GI ROS, Neg liver ROS,   Endo/Other  negative endocrine ROSdiabetes  Renal/GU Renal disease     Musculoskeletal   Abdominal   Peds  Hematology negative hematology ROS (+)   Anesthesia Other Findings Past Medical History: 2003: Colon cancer ([place])     Comment:  Partial colectomy for cancer in polyp No date: Diabetes mellitus No date: Hyperlipidemia ~1997: Hypertension 2000: Kidney stones     Comment:  lithotripsy No date: Urge incontinence of urine  Past Surgical History: 2003: APPENDECTOMY     Comment:  with colectomy 11/14, 12/14: CATARACT EXTRACTION W/ INTRAOCULAR LENS  IMPLANT,  BILATERAL; Bilateral     Comment:  Dr George Ina 2011: Hepatic lesion     Comment:  Cryoablation by radiologist 2000: LITHOTRIPSY 2003: PARTIAL COLECTOMY     Comment:  ascending colon 1947: TONSILLECTOMY AND ADENOIDECTOMY  BMI    Body Mass Index: 26.46 kg/m      Reproductive/Obstetrics negative OB ROS                            Anesthesia Physical Anesthesia Plan  ASA: III  Anesthesia Plan: General LMA   Post-op Pain Management:    Induction: Intravenous  PONV Risk Score and Plan: Dexamethasone, Ondansetron, Midazolam and Treatment may vary due to age or medical condition  Airway Management Planned: LMA  Additional Equipment:   Intra-op Plan:   Post-operative Plan: Extubation  in OR  Informed Consent: I have reviewed the patients History and Physical, chart, labs and discussed the procedure including the risks, benefits and alternatives for the proposed anesthesia with the patient or authorized representative who has indicated his/her understanding and acceptance.     Dental Advisory Given  Plan Discussed with: Anesthesiologist, CRNA and Surgeon  Anesthesia Plan Comments: (History and consent from the patients wife Kolter Reaver at  [phone]   Patient and consented for risks of anesthesia including but not limited to:  - adverse reactions to medications - damage to eyes, teeth, lips or other oral mucosa - nerve damage due to positioning  - sore throat or hoarseness - Damage to heart, brain, nerves, lungs, other parts of body or loss of life  They voiced understanding.)       Anesthesia Quick Evaluation

## 2019-08-15 NOTE — H&P (Signed)
[facility] [place] NAME: Shane Grant    MR#:  [id number]  DATE OF BIRTH:  Nov 30, 1931  DATE OF ADMISSION:  08/14/2019  PRIMARY CARE PHYSICIAN: Venia Carbon, MD   REQUESTING/REFERRING PHYSICIAN: Hinda Kehr, MD CHIEF COMPLAINT:   Chief Complaint  Patient presents with  . Weakness  . Constipation    HISTORY OF PRESENT ILLNESS:  Shane Grant  is a 86 y.o. Caucasian male with a known history of type 2 diabetes mellitus, dyslipidemia and hypertension, who presented to the emergency room with acute onset of generalized weakness for the last couple of days with associated altered mental status at the [facility] where he resides with his wife.  He had a fall yesterday without injuries and had since then progressive weakness.  He has chronic urinary and bowel incontinence but yesterday urinated on himself multiple times.  He has been constipated having no bowel movements over the last 5 days.  He denies any cough or wheezing or dyspnea.  No fever or chills.  He denies any significant dysuria or hematuria or oliguria or flank pain.  Is mildly poor historian due to altered mental status.  He stated that his legs have been feeling weak before he fell yesterday.  Upon presentation to the emergency room, blood pressure was 149/75 with otherwise normal vital signs.  Labs revealed blood glucose of 223 and BUN of 40 with a creatinine of 2.26 up from BUN of 32 and creatinine of 1.4.  Urinalysis showed 6-10 WBCs and 30 protein with trace leukocytes.  CBC showed significant cytosis of 20.5 and hemoglobin of 12.5 with hematocrit of 36.5.  INR is 1.1 with PT of 13.7.  Blood and urine cultures were sent.  Noncontrasted head CT scan revealed age indeterminate small vessel ischemic change within the periventricular white matter with no acute process otherwise.  Two-view chest x-ray showed no acute cardiopulmonary disease. EKG showed normal sinus rhythm with a rate of 82.   Chest CT without contrast showed ascending thoracic aortic aneurysm measuring 5 cm in diameter with recommendation for semiannual imaging follow-up by CTA or MRA as well as bronchiectasis and aortic atherosclerosis and emphysema.  Abdominal and pelvic CT scan revealed obstructing 6 mm proximal left ureteral calculus with significant left-sided obstructive uropathy, bilateral nonobstructing renal calculi and 3.3 cm indeterminate hypodensity in the right kidney that will need follow-up, cholelithiasis without cholecystitis, scattered tree-in-bud groundglass airspace disease within the right middle lobe and the left lower lobe consistent with infection or nonspecific inflammation and aortic atherosclerosis.  The patient was given IV cefepime and Zithromax as well as 1 L bolus of IV lactated Ringer, 4 mg IV Zofran then maintenance IV fluids with normal saline.  He will be admitted to a medically monitored bed for further evaluation and management. PAST MEDICAL HISTORY:   Past Medical History:  Diagnosis Date  . Colon cancer ([place]) 2003   Partial colectomy for cancer in polyp  . Diabetes mellitus   . Hyperlipidemia   . Hypertension ~1997  . Kidney stones 2000   lithotripsy  . Urge incontinence of urine     PAST SURGICAL HISTORY:   Past Surgical History:  Procedure Laterality Date  . APPENDECTOMY  2003   with colectomy  . CATARACT EXTRACTION W/ INTRAOCULAR LENS  IMPLANT, BILATERAL Bilateral 11/14, 12/14   Dr George Ina  . Hepatic lesion  2011   Cryoablation by radiologist  . LITHOTRIPSY  2000  . PARTIAL COLECTOMY  2003   ascending colon  . TONSILLECTOMY AND ADENOIDECTOMY  1947    SOCIAL HISTORY:   Social History   Tobacco Use  . Smoking status: Former Smoker    Types: Cigars  . Smokeless tobacco: Never Used  Substance Use Topics  . Alcohol use: Yes    Comment: Glass of wine or scotch before dinner    FAMILY HISTORY:   Family History  Problem Relation Age of Onset  . Cancer  Brother        lung  . Heart disease Neg Hx   . Hypertension Neg Hx   . Diabetes Neg Hx     DRUG ALLERGIES:   Allergies  Allergen Reactions  . Invokana [Canagliflozin]     Raised sugars and he felt bad  . Ancef [Cefazolin Sodium] Nausea Only    REVIEW OF SYSTEMS:   ROS As per history of present illness. All pertinent systems were reviewed above. Constitutional,  HEENT, cardiovascular, respiratory, GI, GU, musculoskeletal, neuro, psychiatric, endocrine,  integumentary and hematologic systems were reviewed and are otherwise  negative/unremarkable except for positive findings mentioned above in the HPI.   MEDICATIONS AT HOME:   Prior to Admission medications   Medication Sig Start Date End Date Taking? Authorizing Provider  aspirin 81 MG tablet Take 81 mg by mouth daily.      [provider]  atorvastatin (LIPITOR) 80 MG tablet Take 1 tablet (80 mg total) by mouth daily. 03/22/19   Venia Carbon, MD  B-D ULTRAFINE III SHORT PEN 31G X 8 MM MISC USE AS DIRECTED 09/17/18   Venia Carbon, MD  glipiZIDE (GLUCOTROL) 5 MG tablet Take 1 tablet (5 mg total) by mouth 2 (two) times daily before a meal. 04/02/19   Venia Carbon, MD  Insulin Glargine (LANTUS SOLOSTAR) 100 UNIT/ML Solostar Pen INJECT 10 UNITS SUBCUTANEOUSLY ONCE DAILY 12/05/18   Venia Carbon, MD  metFORMIN (GLUCOPHAGE) 1000 MG tablet Take 1 tablet (1,000 mg total) by mouth 2 (two) times daily with 2015 S5 meal. 03/18/19   Venia Carbon, MD      VITAL SIGNS:  Blood pressure (!) 149/75, pulse 80, temperature 98.5 F (36.9 C), temperature source Oral, resp. rate 17, height _0  (1.981 m), weight 103.9 kg, SpO2 97 %.  PHYSICAL EXAMINATION:  Physical Exam  GENERAL:  86 y.o.-year-old Caucasian male patient lying in the bed with no acute distress.  EYES: Pupils equal, round, reactive to light and accommodation. No scleral icterus. Extraocular muscles intact.  HEENT: Head atraumatic, normocephalic.  Oropharynx and nasopharynx clear.  NECK:  Supple, no jugular venous distention. No thyroid enlargement, no tenderness.  LUNGS: Diminished bibasal breath sounds with mild bibasal crackles.  CARDIOVASCULAR: Regular rate and rhythm, S1, S2 normal. No murmurs, rubs, or gallops.  ABDOMEN: Soft, nondistended, nontender. Bowel sounds present. No organomegaly or mass.  EXTREMITIES: No pedal edema, cyanosis, or clubbing.  NEUROLOGIC: Cranial nerves II through XII are intact. Muscle strength 5/5 in all extremities. Sensation intact. Gait not checked.  PSYCHIATRIC: The patient is alert and oriented x 3.  Normal affect and good eye contact. SKIN: No obvious rash, lesion, or ulcer.   LABORATORY PANEL:   CBC Recent Labs  Lab 08/14/19 1924  WBC 20.5*  HGB 12.5*  HCT 36.5*  PLT 266   ------------------------------------------------------------------------------------------------------------------  Chemistries  Recent Labs  Lab 08/14/19 1924 08/14/19 2326  NA 135  --   K 4.9  --   CL 99  --  CO2 24  --   GLUCOSE 223*  --   BUN 40*  --   CREATININE 2.26*  --   CALCIUM 9.7  --   MG  --  2.3  AST  --  30  ALT  --  37  ALKPHOS  --  105  BILITOT  --  1.4*   ------------------------------------------------------------------------------------------------------------------  Cardiac Enzymes No results for input(s): TROPONINI in the last 168 hours. ------------------------------------------------------------------------------------------------------------------  RADIOLOGY:  CT ABDOMEN PELVIS WO CONTRAST  Result Date: 08/14/2019 CLINICAL DATA:  Abdominal pain, constipation and weakness for 5 days EXAM: CT ABDOMEN AND PELVIS WITHOUT CONTRAST TECHNIQUE: Multidetector CT imaging of the abdomen and pelvis was performed following the standard protocol without IV contrast. COMPARISON:  None. FINDINGS: Lower chest: Scattered tree in bud ground-glass airspace disease is seen within the right middle  lobe and left lower lobe, consistent with infection or nonspecific inflammation. No effusion or pneumothorax. Hepatobiliary: Calcified gallstones are identified without cholecystitis. Liver is unremarkable. Pancreas: Unremarkable. No pancreatic ductal dilatation or surrounding inflammatory changes. Spleen: Normal in size without focal abnormality. Adrenals/Urinary Tract: Numerous calculi in a staghorn configuration are seen within the left kidney, measuring up to 3.4 cm in greatest dimension. There is an obstructing 6 mm proximal left ureteral calculus with significant left-sided obstructive uropathy. Multiple nonobstructing right renal calculi are visualized measuring up to 7 mm in size. There are numerous bilateral simple renal cysts. Within the medial interpolar region of the right kidney there is a 3.3 cm indeterminate hypodensity with nonspecific central calcifications. Underlying renal cell carcinoma cannot be excluded, and nonemergent follow-up dedicated renal MRI may be useful. The adrenals are unremarkable. The bladder is moderately distended without focal abnormality. Stomach/Bowel: No bowel obstruction or ileus. Postsurgical changes from right hemicolectomy. No bowel wall thickening or inflammatory change. Moderate stool within the rectal vault. Vascular/Lymphatic: Aortic atherosclerosis. No enlarged abdominal or pelvic lymph nodes. Reproductive: Prostate is unremarkable. Other: No abdominal wall hernia or abnormality. No abdominopelvic ascites. Musculoskeletal: No acute or destructive bony lesions. Reconstructed images demonstrate no additional findings. IMPRESSION: 1. Obstructing 6 mm proximal left ureteral calculus with significant left-sided obstructive uropathy. 2. Bilateral nonobstructing renal calculi. 3. A 3.3 cm indeterminate hypodensity right kidney. Underlying renal cell carcinoma cannot be excluded. Follow-up dedicated renal MRI may be useful. 4. Cholelithiasis without cholecystitis. 5.  Scattered tree in bud ground-glass airspace disease within the right middle lobe and left lower lobe, consistent with infection or nonspecific inflammation. 6. Aortic Atherosclerosis (ICD10-I70.0). Electronically Signed   By: Randa Ngo M.D.   On: 08/14/2019 23:46   DG Chest 2 View  Result Date: 08/14/2019 CLINICAL DATA:  Constipation for 5 days, weakness EXAM: CHEST - 2 VIEW COMPARISON:  None. FINDINGS: Frontal and lateral views of the chest demonstrate an unremarkable cardiac silhouette. Atherosclerosis of the aortic arch. No acute airspace disease, effusion, or pneumothorax. No acute bony abnormalities. IMPRESSION: 1. No acute intrathoracic process. Electronically Signed   By: Randa Ngo M.D.   On: 08/14/2019 19:52   CT Head Wo Contrast  Result Date: 08/14/2019 CLINICAL DATA:  Weakness, altered level of consciousness EXAM: CT HEAD WITHOUT CONTRAST TECHNIQUE: Contiguous axial images were obtained from the base of the skull through the vertex without intravenous contrast. COMPARISON:  None. FINDINGS: Brain: No acute infarct or hemorrhage. Scattered hypodensities are seen within the periventricular white matter consistent with age-indeterminate small vessel ischemic change. Lateral ventricles and midline structures are unremarkable. No acute extra-axial fluid collections. No mass effect. Vascular: No hyperdense  vessel or unexpected calcification. Skull: Normal. Negative for fracture or focal lesion. Sinuses/Orbits: No acute finding. Other: None. IMPRESSION: 1. Age indeterminate small vessel ischemic change within the periventricular white matter. 2. Otherwise no acute process. Electronically Signed   By: Randa Ngo M.D.   On: 08/14/2019 19:55   CT Chest Wo Contrast  Result Date: 08/15/2019 CLINICAL DATA:  Altered level of consciousness, constipation for 5 days, weakness EXAM: CT CHEST WITHOUT CONTRAST TECHNIQUE: Multidetector CT imaging of the chest was performed following the standard  protocol without IV contrast. COMPARISON:  08/14/2019 FINDINGS: Cardiovascular: Unenhanced images of the heart and great vessels demonstrates no pericardial effusion. Prominent calcification of the aortic valve. Extensive atherosclerosis of the aorta and coronary vessels. There is aneurysmal dilation of the ascending thoracic aorta measuring approximately 5 cm in diameter. Evaluation of the vascular lumen is limited without IV contrast. Mediastinum/Nodes: No enlarged mediastinal or axillary lymph nodes. Thyroid gland, trachea, and esophagus demonstrate no significant findings. Lungs/Pleura: There is upper lobe predominant emphysema. Minimal bronchiectasis is identified, greatest at the lung bases. Minimal tree in bud airspace disease within the right upper, right middle, and left upper lobe likely sequela of chronic indolent infection such as MAC. No other airspace disease, effusion, or pneumothorax. The central airways are otherwise patent. Upper Abdomen: Numerous renal cysts are partially visualized. No acute upper abdominal findings. Musculoskeletal: No acute or destructive bony lesions. Reconstructed images demonstrate no additional findings. IMPRESSION: 1. Ascending thoracic aortic aneurysm measuring 5 cm in diameter. Recommend semi-annual imaging followup by CTA or MRA and referral to cardiothoracic surgery if not already obtained. This recommendation follows 2010 ACCF/AHA/AATS/ACR/ASA/SCA/SCAI/SIR/STS/SVM Guidelines for the Diagnosis and Management of Patients With Thoracic Aortic Disease. Circulation. 2010; 121: S010-X323. Aortic aneurysm NOS (ICD10-I71.9) 2. Bronchiectasis with tree in bud airspace disease, likely sequela of chronic indolent infection such as MAC. 3. Aortic Atherosclerosis (ICD10-I70.0) and Emphysema (ICD10-J43.9). Electronically Signed   By: Randa Ngo M.D.   On: 08/15/2019 00:37      IMPRESSION AND PLAN:   1.  Left obstructive uropathy secondary to left ureteral stone.  The  patient has subsequent significant leukocytosis. -He will be admitted to a medically monitored bed. -We will continue antibiotic therapy with IV meropenem -A urology consultation will be obtained. -Dr. Diamantina Providence was notified about the patient and is aware. -Pain management will be provided. -Renal functions will be followed. -Both in urine cultures will be followed.  2.  Bronchiectasis and possible acute airspace disease in the right middle lobe and left lower lobe. -We will add IV Zithromax to his meropenem and follow sputum and blood cultures. -Mucolytic therapy will be provided.  3.  Acute kidney injury on stage IIIa chronic kidney disease. -The patient will be hydrated with IV normal saline will follow BMP. -This should improve with resolution of his obstructive uropathy.  4.  Type 2 diabetes mellitus. -We will continue his basal coverage and place on supplemental coverage with subcutaneous NovoLog. -Metformin will be held off especially given acute kidney injury.  5.  Dyslipidemia. -Statin therapy will be resumed.  6.  DVT prophylaxis. -Subtenons Lovenox.  All the records are reviewed and case discussed with ED provider. The plan of care was discussed in details with the patient (and family). I answered all questions. The patient agreed to proceed with the above mentioned plan. Further management will depend upon hospital course.   CODE STATUS: Full code  Status is: Inpatient  Remains inpatient appropriate because:Ongoing active pain requiring inpatient pain management,  Ongoing diagnostic testing needed not appropriate for outpatient work up, Unsafe d/c plan, IV treatments appropriate due to intensity of illness or inability to take PO and Inpatient level of care appropriate due to severity of illness   Dispo: The patient is from: Home              Anticipated d/c is to: Home              Anticipated d/c date is: 2 days              Patient currently is medically stable  to d/c.   TOTAL TIME TAKING CARE OF THIS PATIENT: 55 minutes.    Christel Mormon M.D on 08/15/2019 at 2:09 AM  [facility]   From 7 PM-7 AM, contact night-coverage [email or web address]  CC: Primary care physician; Venia Carbon, MD   Note: This dictation was prepared with Dragon dictation along with smaller phrase technology. Any transcriptional typo errors that result from this process are unintentional.

## 2019-08-15 NOTE — Progress Notes (Signed)
Pharmacy Antibiotic Note  Shane Grant is a 86 y.o. male admitted on 08/14/2019 with pneumonia.  Pharmacy has been consulted for cefepime dosing.  Plan: Patient will receive cefepime 2g IV x 1 in ED.  Will continue cefepime 2g IV q12h to start at 07/01 at 1000 and continue to monitor and adjust per changes in renal function.  Height: 6\' 6"  (198.1 cm) Weight: 103.9 kg (229 lb) IBW/kg (Calculated) : 91.4  Temp (24hrs), Avg:98.5 F (36.9 C), Min:98.5 F (36.9 C), Max:98.5 F (36.9 C)  Recent Labs  Lab 08/14/19 1924 08/14/19 2326  WBC 20.5*  --   CREATININE 2.26*  --   LATICACIDVEN  --  1.3    Estimated Creatinine Clearance: 30.9 mL/min (A) (by C-G formula based on SCr of 2.26 mg/dL (H)).    Allergies  Allergen Reactions  . Invokana [Canagliflozin]     Raised sugars and he felt bad  . Ancef [Cefazolin Sodium] Nausea Only    Thank you for allowing pharmacy to be a part of this patient's care.  Tobie Lords, PharmD, BCPS Clinical Pharmacist 08/15/2019 12:41 AM

## 2019-08-15 NOTE — Transfer of Care (Signed)
Immediate Anesthesia Transfer of Care Note  Patient: Shane Grant  Procedure(s) Performed: CYSTOSCOPY WITH STENT PLACEMENT (Left ) CYSTOSCOPY WITH RETROGRADE PYELOGRAM (Left ) CYSTOSCOPY WITH LITHOLAPAXY  Patient Location: PACU  Anesthesia Type:General  Level of Consciousness: drowsy  Airway & Oxygen Therapy: Patient Spontanous Breathing and Patient connected to face mask oxygen  Post-op Assessment: Report given to RN and Post -op Vital signs reviewed and stable  Post vital signs: Reviewed and stable  Last Vitals:  Vitals Value Taken Time  BP    Temp    Pulse 60 08/15/19 1347  Resp 12 08/15/19 1347  SpO2 100 % 08/15/19 1347  Vitals shown include unvalidated device data.  Last Pain:  Vitals:   08/15/19 1130  TempSrc: Tympanic  PainSc: 0-No pain         Complications: No complications documented.

## 2019-08-15 NOTE — Op Note (Addendum)
Date of procedure: 08/15/19  Preoperative diagnosis:  1. Left proximal ureteral stone  Postoperative diagnosis:  1. Left proximal ureteral stone 2. Bladder stone 3 cm and 2 cm  Procedure: 1. Cystolitholapaxy 3 cm and 2 cm bladder stone 2. Left retrograde pyelogram with intraoperative interpretation, left ureteral stent placement  Surgeon: Nickolas Madrid, MD  Anesthesia: General  Complications: None  Intraoperative findings:  1.  Large black a 3 cm stone lodged in prostatic urethra making entry to bladder very difficult 2.  Uncomplicated dusting of prostatic urethra stone and bladder stone 3.  Uncomplicated left ureteral stent placement, no purulence noted  EBL: Minimal  Specimens: None  Drains: Left 6 French by 30 cm ureteral stent  Indication: Shane Grant is a 86 y.o. patient with chronic incontinence who presented with 1 day of weakness and was found to have a proximal left ureteral stone, leukocytosis, and AKI.  Urinalysis was relatively benign, and he opted for stent placement in the setting of his leukocytosis and AKI.  After reviewing the management options for treatment, they elected to proceed with the above surgical procedure(s). We have discussed the potential benefits and risks of the procedure, side effects of the proposed treatment, the likelihood of the patient achieving the goals of the procedure, and any potential problems that might occur during the procedure or recuperation. Informed consent has been obtained.  Description of procedure:  The patient was taken to the operating room and general anesthesia was induced. SCDs were placed for DVT prophylaxis.The patient was placed in the dorsal lithotomy position, prepped and draped in the usual sterile fashion.  Antibiotics had been given in the emergency department earlier in the day.  A preoperative time-out was performed.   A 21 French rigid cystoscope was used to intubate the urethra and a normal-appearing  urethra was followed proximally towards the bladder.  The prostatic urethra was blocked by a large 3cm black stone, which made it very challenging to enter the bladder.  I was ultimately able to navigate alongside the stone into the bladder which was massively distended, and had another 2 cm bladder stone.  Thorough inspection of the bladder revealed no tumors, and the ureteral orifices were orthotopic bilaterally.  A retrograde pyelogram was performed on the left side which showed a filling defect in the proximal ureter consistent with his known stone and upstream hydronephrosis.  On fluoroscopy, I could clearly see the lower pole partial staghorn stone.  With moderate difficulty, and the aid of an access catheter I was able to eventually navigate a sensor wire alongside the stone into the upper pole of the kidney.  A 6 French by 30 cm ureteral stent was uneventfully placed with an excellent curl in the upper pole, as well as under direct vision the bladder.  Fluid was seen to drain through the end of the stent.  There was no purulence noted.  I then turned my attention to the bladder stones.  A 500 m laser fiber on settings of 1.5 J and 15 Hz was used to dust the 2 cm bladder stone, as well as the 3 cm prostatic urethral stone, and all pieces were irrigated free from the bladder.  I suspect his incontinence was secondary to this large stone that was lodged in the prostatic fossa.  An 83 [place] two-way coud Foley passed easily into the bladder with return of clear urine, 10 cc were placed in the balloon.  Disposition: Stable to PACU  Plan: Continue antibiotics until cultures negative  Maintain Foley overnight and trend renal function, monitor for post-obstructive diuresis Will need follow-up in 2 to 3 weeks for either staged left ureteroscopy for partial staghorn kidney stone versus PCNL  Nickolas Madrid, MD

## 2019-08-15 NOTE — ED Notes (Signed)
Wife has left for the night. Will call back to check on patient tomorrow but is requesting an update if pt is moved to another room.

## 2019-08-15 NOTE — ED Notes (Signed)
Pt sleeping soundly again when RN entered room.NAD noted with audible snoring.

## 2019-08-16 ENCOUNTER — Encounter: Payer: Self-pay | Admitting: Urology

## 2019-08-16 ENCOUNTER — Inpatient Hospital Stay: Payer: Medicare Other

## 2019-08-16 ENCOUNTER — Telehealth: Payer: Self-pay | Admitting: Urology

## 2019-08-16 DIAGNOSIS — N200 Calculus of kidney: Secondary | ICD-10-CM

## 2019-08-16 DIAGNOSIS — R531 Weakness: Secondary | ICD-10-CM

## 2019-08-16 DIAGNOSIS — M79604 Pain in right leg: Secondary | ICD-10-CM

## 2019-08-16 DIAGNOSIS — E1140 Type 2 diabetes mellitus with diabetic neuropathy, unspecified: Secondary | ICD-10-CM

## 2019-08-16 LAB — CBC
HCT: 29.5 % — ABNORMAL LOW (ref 39.0–52.0)
Hemoglobin: 10.1 g/dL — ABNORMAL LOW (ref 13.0–17.0)
MCH: 30.3 pg (ref 26.0–34.0)
MCHC: 34.2 g/dL (ref 30.0–36.0)
MCV: 88.6 fL (ref 80.0–100.0)
Platelets: 230 10*3/uL (ref 150–400)
RBC: 3.33 MIL/uL — ABNORMAL LOW (ref 4.22–5.81)
RDW: 13.1 % (ref 11.5–15.5)
WBC: 12.6 10*3/uL — ABNORMAL HIGH (ref 4.0–10.5)
nRBC: 0 % (ref 0.0–0.2)

## 2019-08-16 LAB — HEMOGLOBIN A1C
Hgb A1c MFr Bld: 8.1 % — ABNORMAL HIGH (ref 4.8–5.6)
Mean Plasma Glucose: 186 mg/dL

## 2019-08-16 LAB — BASIC METABOLIC PANEL
Anion gap: 5 (ref 5–15)
BUN: 34 mg/dL — ABNORMAL HIGH (ref 8–23)
CO2: 25 mmol/L (ref 22–32)
Calcium: 8.6 mg/dL — ABNORMAL LOW (ref 8.9–10.3)
Chloride: 109 mmol/L (ref 98–111)
Creatinine, Ser: 1.62 mg/dL — ABNORMAL HIGH (ref 0.61–1.24)
GFR calc Af Amer: 44 mL/min — ABNORMAL LOW (ref >60)
GFR calc non Af Amer: 38 mL/min — ABNORMAL LOW (ref >60)
Glucose, Bld: 157 mg/dL — ABNORMAL HIGH (ref 70–99)
Potassium: 4.7 mmol/L (ref 3.5–5.1)
Sodium: 139 mmol/L (ref 135–145)

## 2019-08-16 LAB — GLUCOSE, CAPILLARY
Glucose-Capillary: 154 mg/dL — ABNORMAL HIGH (ref 70–99)
Glucose-Capillary: 192 mg/dL — ABNORMAL HIGH (ref 70–99)

## 2019-08-16 MED ORDER — NITROFURANTOIN MONOHYD MACRO 100 MG PO CAPS
100.0000 mg | ORAL_CAPSULE | Freq: Every day | ORAL | 0 refills | Status: AC
Start: 2019-08-16 — End: 2019-09-06

## 2019-08-16 MED ORDER — POLYETHYLENE GLYCOL 3350 17 G PO PACK
17.0000 g | PACK | Freq: Every day | ORAL | Status: DC
  Administered 2019-08-16: 17 g via ORAL
  Filled 2019-08-16: qty 1

## 2019-08-16 MED ORDER — BISACODYL 5 MG PO TBEC
5.0000 mg | DELAYED_RELEASE_TABLET | Freq: Every day | ORAL | Status: DC | PRN
  Administered 2019-08-16: 5 mg via ORAL
  Filled 2019-08-16: qty 1

## 2019-08-16 MED ORDER — MAGNESIUM CITRATE PO SOLN: 1.0000 | Freq: Once | ORAL | Status: DC

## 2019-08-16 MED ORDER — MAGNESIUM CITRATE PO SOLN
1.0000 | Freq: Once | ORAL | Status: AC | PRN
  Administered 2019-08-16: 1 via ORAL
  Filled 2019-08-16 (×2): qty 296

## 2019-08-16 NOTE — Telephone Encounter (Signed)
LMOM for pt to call office to schedule appt w/[facility] next week.

## 2019-08-16 NOTE — Evaluation (Signed)
Physical Therapy Evaluation Patient Details Name: Shane Grant MRN: [id number] DOB: 05-Feb-1932 Today's Date: 08/16/2019   History of Present Illness  [name]") is an 86 y/o male who was admitted for obstructive uropathy. Chief complaints include weakness x a couple of days, constipation, and AMS at [facility]. Pt is s/p L ureteral stone removal and L renal stent placement. PMH includes DMII, dyslipidemia, HTN, colon cancer with partial colectomy in 2003, ascending thoracic aortic aneurism, and urge incontinence.  Clinical Impression  Pt lying in bed upon arrival to room with foot board removed as pt's LEs hang over end of bed. Pt's wife at bedside and pt agreed to participate in PT eval this morning. Pt required min A for bed mobility for trunk elevation into sitting and BLE back onto bed. Pt with LOB to R when sitting edge of bed and unable to maintain for longer than ~5 seconds requiring min A for correction. Min-mod A for transfers from elevated bed and from lower recliner chair secondary to BLE weakness. Pt ambulated 25' within room using RW and required min A for steadying and safety. Pt exhibits forward flexed posture and decreased step lengths bilaterally with decreased gait speed increasing risk for falls. Pt has a history of falls and reported 2 in 2021 without injury. At this time, pt with decreased overall strength, decreased balance, and requires assistance for bed mobility, transfers, and ambulation. Recommend continued skilled PT at this time to address aforementioned deficits and SNF at discharge to optimize return to PLOF, decrease fall risks, and maximize functional mobility and independence.    Follow Up Recommendations SNF    Equipment Recommendations  Rolling walker with 5" wheels;3in1 (PT)    Recommendations for Other Services       Precautions / Restrictions Precautions Precautions: Fall Restrictions Weight Bearing Restrictions: No      Mobility  Bed  Mobility Overal bed mobility: Needs Assistance Bed Mobility: Supine to Sit;Sit to Supine     Supine to sit: Min assist Sit to supine: Min assist   General bed mobility comments: Increased time required to perform supine <> sit with min A for truncal support when coming into sitting and min A for BLE elevation onto bed during sit to supine.  Transfers Overall transfer level: Needs assistance Equipment used: Rolling walker (2 wheeled) Transfers: Sit to/from Stand Sit to Stand: Min assist;Mod assist         General transfer comment: Min-mod A for sit to stand transfer from bed and mod A from recliner chair for boosting hips and balance while transitioning hands from bed to RW. Increased verbal cues for safe hand placement during transfers. Stand to sit required mod A for eccentric control on descent.  Ambulation/Gait Ambulation/Gait assistance: Min assist Gait Distance (Feet): 25 Feet Assistive device: Rolling walker (2 wheeled) Gait Pattern/deviations: Step-to pattern;Decreased step length - right;Decreased step length - left;Decreased stride length;Trunk flexed;Narrow base of support Gait velocity: decreased   General Gait Details: Pt ambulated using RW with min A for steadying and safety. Pt required increased verbal cues for walker management and improved posture. Pt exhibits decreased step lengths bilaterally and ambulates with forward head and shoulders with flexed trunk throughout ambulation distance. Pt had incontinent episode of urine while ambulating.  Stairs            Wheelchair Mobility    Modified Rankin (Stroke Patients Only)       Balance Overall balance assessment: Needs assistance Sitting-balance support: Feet supported;Single extremity supported;Bilateral  upper extremity supported Sitting balance-Leahy Scale: Fair Sitting balance - Comments: Pt with noted R lateral lean and LOB when seated edge of bed despite B feet supported and unilat support on  bedrail and bilat UE support on bed. Pt able to maintain static sitting balance for ~5 seconds before losing balance to R. Postural control: Right lateral lean Standing balance support: Bilateral upper extremity supported Standing balance-Leahy Scale: Fair Standing balance comment: Pt required min A for steadying while upright with BUE support on RW.                              Pertinent Vitals/Pain Pain Assessment: No/denies pain    Home Living Family/patient expects to be discharged to:: Skilled nursing facility (family; pt wishes to return home) Living Arrangements: Spouse/significant other                    Prior Function Level of Independence: Independent         Comments: Pt reported independent ambulation however wife states occasional use of rollator. Ptreports 2 falls in 2021, without injury - states he needs assistance to get up off the floor.     Hand Dominance   Dominant Hand: Right    Extremity/Trunk Assessment   Upper Extremity Assessment Upper Extremity Assessment: Generalized weakness (4-/5 overall)    Lower Extremity Assessment Lower Extremity Assessment: Generalized weakness (hip flexion 2+/5 bilaterally; overall 3 to 3+/5)    Cervical / Trunk Assessment Cervical / Trunk Assessment: Kyphotic  Communication   Communication: HOH  Cognition Arousal/Alertness: Awake/alert Behavior During Therapy: WFL for tasks assessed/performed Overall Cognitive Status: Within Functional Limits for tasks assessed                                        General Comments      Exercises Other Exercises Other Exercises: pt performed AP and LAQ x 10 bilaterally when seated in recliner chair   Assessment/Plan    PT Assessment Patient needs continued PT services  PT Problem List Decreased strength;Decreased activity tolerance;Decreased balance;Decreased mobility;Decreased knowledge of use of DME;Decreased safety awareness        PT Treatment Interventions      PT Goals (Current goals can be found in the Care Plan section)  Acute Rehab PT Goals Patient Stated Goal: to go home PT Goal Formulation: With patient/family Time For Goal Achievement: 08/30/19 Potential to Achieve Goals: Fair Additional Goals Additional Goal #1: Pt will perform bed mobility and EOB sitting with no more than supervision. (for improved functional mobility and independence)    Frequency Min 2X/week   Barriers to discharge Other (comment) Pt lives with wife at [employer] living facility [facility]) however she is physically unable to assist at the level that he requires at this time    Co-evaluation               AM-PAC PT "6 Clicks" Mobility  Outcome Measure Help needed turning from your back to your side while in a flat bed without using bedrails?: A Little Help needed moving from lying on your back to sitting on the side of a flat bed without using bedrails?: A Lot Help needed moving to and from a bed to a chair (including a wheelchair)?: A Little Help needed standing up from a chair using your arms (e.g., wheelchair or  bedside chair)?: A Lot Help needed to walk in hospital room?: A Little Help needed climbing 3-5 steps with a railing? : A Lot 6 Click Score: 15    End of Session Equipment Utilized During Treatment: Gait belt Activity Tolerance: Patient limited by fatigue Patient left: in bed (pt positioned in bed and was being transported to [place]) Nurse Communication: Mobility status PT Visit Diagnosis: Unsteadiness on feet (R26.81);Repeated falls (R29.6);Muscle weakness (generalized) (M62.81);History of falling (Z91.81)    Time: 8250-0370 PT Time Calculation (min) (ACUTE ONLY): 43 min   Charges:   PT Evaluation $PT Eval Moderate Complexity: 1 Mod PT Treatments $Gait Training: 8-22 mins $Therapeutic Exercise: 8-22 mins        Vale Haven, SPT  Krystle Oberman 08/16/2019, 4:18 PM

## 2019-08-16 NOTE — TOC Transition Note (Signed)
Transition of Care [facility]) - CM/SW Discharge Note   Patient Details  Name: Rice Walsh MRN: [id number] Date of Birth: 28-Nov-1931  Transition of Care [facility]) CM/SW Contact:  Meriel Flavors, LCSW Phone Number: 08/16/2019, 4:56 PM   Clinical Narrative:    [facility] consult for SNF placement, Met with patient and spouse to discuss options. Patient is currently living at [facility] independent with wife. Patient refused skilled nursing facility and requested discharging back to [facility] with HH/PT. CSW informed doctor and nurse of Patient's decision. Wife is with patient and will transport back to [facility] when discharged.         Patient Goals and CMS Choice        Discharge Placement                       Discharge Plan and Services                                     Social Determinants of Health (SDOH) Interventions     Readmission Risk Interventions No flowsheet data found.

## 2019-08-16 NOTE — Progress Notes (Addendum)
PROGRESS NOTE    Shane Grant   URK:[id number]  DOB: 08-Jan-1932  PCP: Venia Carbon, MD    DOA: 08/14/2019 LOS: 1   Brief Narrative   Shane Grant  is a 86 y.o. Caucasian male with a known history of type 2 diabetes mellitus, dyslipidemia and hypertension, who presented to the emergency room with progressive generalized weakness for the last couple of days with associated altered mental status.  He had a fall the day before without injuries.  He has chronic urinary and bowel incontinence but yesterday urinated on himself multiple times.  He has been constipated having no bowel movements over the last 5 days.  No significant dysuria or hematuria or oliguria or flank pain.  His and wife's primary concern are his unsteady gait and leg weakness for the past month or so.    In the ED, mildly hypertensive at 149/75 with otherwise normal vital signs.  Labs revealed hyperglycemia 223, BUN 40, creatinine of 2.26 (from 1.4).  Urinalysis showed 6-10 WBCs and 30 protein with trace leukocytes.  CBC showed significant leukocytosis of 20.5 and hemoglobin of 12.5 with hematocrit of 36.5.  Blood and urine cultures were sent.  Head CT scan showed no acute process, chronic changes per report.  Chest x-ray negative. Chest CT without contrast showed ascending thoracic aortic aneurysm measuring 5 cm in diameter with recommendation for semiannual imaging follow-up by CTA or MRA as well as bronchiectasis and aortic atherosclerosis and emphysema.  CT abdomen/pelvis revealed obstructing 6 mm proximal left ureteral calculus with significant left-sided obstructive uropathy, bilateral nonobstructing renal calculi and 3.3 cm indeterminate hypodensity in the right kidney that will need follow-up, and other non-pertinent findings per report.  He was treated with IV Cefepime and Zithromax, 1 L bolus LR, IV Zofran Admitted to hospitalist service with urology consulted for the obstructing stone causing AKI.        Assessment & Plan   Principal Problem:   Obstructive uropathy Active Problems:   Generalized weakness   Right leg pain   Type 2 diabetes, controlled, with neuropathy (HCC)   Hyperlipidemia   Aortic stenosis   Obstructive uropathy - POA with AKI and obstructing left ureteral stone and partial left staghorn stone.  S/P cystolitholapaxy 3 cm and 2 cm bladder stones and left ureteral stent placement on 7/1 with Dr. Diamantina Providence.  Urology following.  D/C Foley this AM.  Pt is voiding.  Macrobid 16m PO daily x 3 weeks for suppressive therapy per urology.  Follow up next week.        Generalized weakness - due to overall deconditioning and likely acute illness above.  PT evaluated and recommend SNF for rehab on discharge.  TOC consulted.  Right leg pain - POA, recent history of right leg pain with ambulation, improves with rest, consistent with claudication.  No swelling relative to the left leg and no erythema or tenderness on palpation.  ABI's are pending to evaluate.  Type 2 diabetes - continue Lantus and sliding scale Novolog.  Hold metformin and glipizide.  Hyperlipidemia - continue Lipitor.  Aortic stenosis - no acute issues. Monitor.   Patient BMI: Body mass index is 26.47 kg/m.   DVT prophylaxis: enoxaparin (LOVENOX) injection 40 mg Start: 08/16/19 0600 Place and maintain sequential compression device Start: 08/15/19 0848   Diet:  Diet Orders (From admission, onward)    Start     Ordered   08/15/19 1610  Diet heart healthy/carb modified Room service appropriate? Yes; Fluid consistency: Thin  Diet effective now       Question Answer Comment  Diet-HS Snack? Nothing   Room service appropriate? Yes   Fluid consistency: Thin      08/15/19 1610            Code Status: Full Code    Subjective 08/16/19    Patient seen with wife at bedside this AM.  Foley was removed, patient had not yet voided.  Denies abdominal, pelvic, groin or flank pain.  He and wife say their main  reason for coming to hospital was patient' difficulty walking with weak legs and pain in the right leg.  Pt states the right lower leg will start hurting when he walks and gets better when he rests.  No other complaints.  Pt wants to go home, but SNF was recommended.   Disposition Plan & Communication   Status is: Inpatient  Remains inpatient appropriate because:Unsafe d/c plan   Dispo: The patient is from: Home              Anticipated d/c is to: SNF              Anticipated d/c date is: 1 day              Patient currently is medically stable for discharge but very high risk of falls and serious injury.  Patient has had falls recently and remains too weak for safe discharge to prior environment at this time.     Family Communication: wife at bedside during encounter    Consults, Procedures, Significant Events   Consultants:   Urology  Procedures:   Left ureteral stent placement and bladder stone removal on 08/15/19    Objective   Vitals:   08/15/19 2039 08/16/19 0017 08/16/19 0454 08/16/19 1224  BP: (!) 92/55 (!) 94/55 (!) 100/54 (!) 144/79  Pulse: 63 67 63 80  Resp: _0 Temp: 98.3 F (36.8 C) 98.6 F (37 C) 99 F (37.2 C) 99.3 F (37.4 C)  TempSrc: Oral Oral Oral Oral  SpO2: 92% 97% 94% 93%  Weight:      Height:        Intake/Output Summary (Last 24 hours) at 08/16/2019 1432 Last data filed at 08/16/2019 0100 Gross per 24 hour  Intake 742.84 ml  Output 1250 ml  Net -507.16 ml   Filed Weights   08/14/19 1916 08/15/19 1130  Weight: 103.9 kg 103.9 kg    Physical Exam:  General exam: awake, alert, no acute distress HEENT: atraumatic, clear conjunctiva, anicteric sclera, moist mucus membranes, hearing grossly normal  Respiratory system: CTAB, no wheezes, rales or rhonchi, normal respiratory effort. Cardiovascular system: normal R1/R9, RRR, 3/6 systolic murmur, no pedal edema.   Gastrointestinal system: soft, NT, ND, no HSM felt, +bowel  sounds. Central nervous system: A&O x3. no gross focal neurologic deficits, normal speech Extremities: moves all, no cyanosis, normal tone Skin: dry, intact, normal temperature, normal color Psychiatry: normal mood, congruent affect  Labs   Data Reviewed: I have personally reviewed following labs and imaging studies  CBC: Recent Labs  Lab 08/14/19 1924 08/15/19 0442 08/16/19 0658  WBC 20.5* 16.8* 12.6*  HGB 12.5* 11.0* 10.1*  HCT 36.5* 33.4* 29.5*  MCV 88.8 90.3 88.6  PLT 266 230 458   Basic Metabolic Panel: Recent Labs  Lab 08/14/19 1924 08/14/19 2326 08/15/19 0442 08/16/19 0658  NA 135  --  137 139  K 4.9  --  4.7 4.7  CL 99  --  103 109  CO2 24  --  27 25  GLUCOSE 223*  --  167* 157*  BUN 40*  --  40* 34*  CREATININE 2.26*  --  2.09* 1.62*  CALCIUM 9.7  --  9.4 8.6*  MG  --  2.3  --   --    GFR: Estimated Creatinine Clearance: 43.1 mL/min (A) (by C-G formula based on SCr of 1.62 mg/dL (H)). Liver Function Tests: Recent Labs  Lab 08/14/19 2326  AST 30  ALT 37  ALKPHOS 105  BILITOT 1.4*  PROT 7.4  ALBUMIN 3.6   Recent Labs  Lab 08/14/19 2326  LIPASE 25   No results for input(s): AMMONIA in the last 168 hours. Coagulation Profile: Recent Labs  Lab 08/14/19 2326  INR 1.1   Cardiac Enzymes: Recent Labs  Lab 08/14/19 1924  CKTOTAL 80   BNP (last 3 results) No results for input(s): PROBNP in the last 8760 hours. HbA1C: Recent Labs    08/15/19 0442  HGBA1C 8.1*   CBG: Recent Labs  Lab 08/15/19 1350 08/15/19 1640 08/15/19 2058 08/16/19 0753 08/16/19 1220  GLUCAP 118* 123* 190* 154* 192*   Lipid Profile: No results for input(s): CHOL, HDL, LDLCALC, TRIG, CHOLHDL, LDLDIRECT in the last 72 hours. Thyroid Function Tests: No results for input(s): TSH, T4TOTAL, FREET4, T3FREE, THYROIDAB in the last 72 hours. Anemia Panel: No results for input(s): VITAMINB12, FOLATE, FERRITIN, TIBC, IRON, RETICCTPCT in the last 72 hours. Sepsis  Labs: Recent Labs  Lab 08/14/19 2326  PROCALCITON 0.30  LATICACIDVEN 1.3    Recent Results (from the past 240 hour(s))  Blood Culture (routine x 2)     Status: None (Preliminary result)   Collection Time: 08/14/19 11:26 PM   Specimen: BLOOD LEFT FOREARM  Result Value Ref Range Status   Specimen Description BLOOD LEFT FOREARM  Final   Special Requests   Final    BOTTLES DRAWN AEROBIC AND ANAEROBIC Blood Culture adequate volume   Culture   Final    NO GROWTH 2 DAYS Performed at [facility], [address] [place], [place] [address]    Report Status PENDING  Incomplete  Blood Culture (routine x 2)     Status: None (Preliminary result)   Collection Time: 08/14/19 11:26 PM   Specimen: Right Antecubital; Blood  Result Value Ref Range Status   Specimen Description RIGHT ANTECUBITAL  Final   Special Requests   Final    BOTTLES DRAWN AEROBIC AND ANAEROBIC Blood Culture adequate volume   Culture   Final    NO GROWTH 2 DAYS Performed at [facility], [address] [place], [place] [address]    Report Status PENDING  Incomplete  Urine culture     Status: Abnormal (Preliminary result)   Collection Time: 08/14/19 11:26 PM   Specimen: In/Out Cath Urine  Result Value Ref Range Status   Specimen Description   Final    IN/OUT CATH URINE Performed at [facility], [address], [place], [place] [address]    Special Requests   Final    Normal Performed at [facility], [place], [place] [address]    Culture 7,000 COLONIES/mL ENTEROCOCCUS FAECALIS (A)  Final   Report Status PENDING  Incomplete  SARS Coronavirus 2 by RT PCR (hospital order, performed in [place] hospital lab) Nasopharyngeal Nasopharyngeal Swab     Status: None   Collection Time: 08/14/19 11:32 PM   Specimen: Nasopharyngeal Swab  Result Value Ref  Range Status   SARS Coronavirus 2 NEGATIVE NEGATIVE Final    Comment: (NOTE) SARS-CoV-2 target nucleic acids are  NOT DETECTED.  The SARS-CoV-2 RNA is generally detectable in upper and lower respiratory specimens during the acute phase of infection. The lowest concentration of SARS-CoV-2 viral copies this assay can detect is 250 copies / mL. A negative result does not preclude SARS-CoV-2 infection and should not be used as the sole basis for treatment or other patient management decisions.  A negative result may occur with improper specimen collection / handling, submission of specimen other than nasopharyngeal swab, presence of viral mutation(s) within the areas targeted by this assay, and inadequate number of viral copies (<250 copies / mL). A negative result must be combined with clinical observations, patient history, and epidemiological information.  Fact Sheet for Patients:   StrictlyIdeas.no  Fact Sheet for Healthcare Providers: BankingDealers.co.za  This test is not yet approved or  cleared by the [place] FDA and has been authorized for detection and/or diagnosis of SARS-CoV-2 by FDA under an Emergency Use Authorization (EUA).  This EUA will remain in effect (meaning this test can be used) for the duration of the COVID-19 declaration under Section 564(b)(1) of the Act, 21 U.S.C. section 360bbb-3(b)(1), unless the authorization is terminated or revoked sooner.  Performed at [facility], [address] [place], [place] [address]       Imaging Studies   CT ABDOMEN PELVIS WO CONTRAST  Result Date: 08/14/2019 CLINICAL DATA:  Abdominal pain, constipation and weakness for 5 days EXAM: CT ABDOMEN AND PELVIS WITHOUT CONTRAST TECHNIQUE: Multidetector CT imaging of the abdomen and pelvis was performed following the standard protocol without IV contrast. COMPARISON:  None. FINDINGS: Lower chest: Scattered tree in bud ground-glass airspace disease is seen within the right middle lobe and left lower lobe, consistent with infection or  nonspecific inflammation. No effusion or pneumothorax. Hepatobiliary: Calcified gallstones are identified without cholecystitis. Liver is unremarkable. Pancreas: Unremarkable. No pancreatic ductal dilatation or surrounding inflammatory changes. Spleen: Normal in size without focal abnormality. Adrenals/Urinary Tract: Numerous calculi in a staghorn configuration are seen within the left kidney, measuring up to 3.4 cm in greatest dimension. There is an obstructing 6 mm proximal left ureteral calculus with significant left-sided obstructive uropathy. Multiple nonobstructing right renal calculi are visualized measuring up to 7 mm in size. There are numerous bilateral simple renal cysts. Within the medial interpolar region of the right kidney there is a 3.3 cm indeterminate hypodensity with nonspecific central calcifications. Underlying renal cell carcinoma cannot be excluded, and nonemergent follow-up dedicated renal MRI may be useful. The adrenals are unremarkable. The bladder is moderately distended without focal abnormality. Stomach/Bowel: No bowel obstruction or ileus. Postsurgical changes from right hemicolectomy. No bowel wall thickening or inflammatory change. Moderate stool within the rectal vault. Vascular/Lymphatic: Aortic atherosclerosis. No enlarged abdominal or pelvic lymph nodes. Reproductive: Prostate is unremarkable. Other: No abdominal wall hernia or abnormality. No abdominopelvic ascites. Musculoskeletal: No acute or destructive bony lesions. Reconstructed images demonstrate no additional findings. IMPRESSION: 1. Obstructing 6 mm proximal left ureteral calculus with significant left-sided obstructive uropathy. 2. Bilateral nonobstructing renal calculi. 3. A 3.3 cm indeterminate hypodensity right kidney. Underlying renal cell carcinoma cannot be excluded. Follow-up dedicated renal MRI may be useful. 4. Cholelithiasis without cholecystitis. 5. Scattered tree in bud ground-glass airspace disease within  the right middle lobe and left lower lobe, consistent with infection or nonspecific inflammation. 6. Aortic Atherosclerosis (ICD10-I70.0). Electronically Signed   By: Randa Ngo  M.D.   On: 08/14/2019 23:46   DG Chest 2 View  Result Date: 08/14/2019 CLINICAL DATA:  Constipation for 5 days, weakness EXAM: CHEST - 2 VIEW COMPARISON:  None. FINDINGS: Frontal and lateral views of the chest demonstrate an unremarkable cardiac silhouette. Atherosclerosis of the aortic arch. No acute airspace disease, effusion, or pneumothorax. No acute bony abnormalities. IMPRESSION: 1. No acute intrathoracic process. Electronically Signed   By: Randa Ngo M.D.   On: 08/14/2019 19:52   CT Head Wo Contrast  Result Date: 08/14/2019 CLINICAL DATA:  Weakness, altered level of consciousness EXAM: CT HEAD WITHOUT CONTRAST TECHNIQUE: Contiguous axial images were obtained from the base of the skull through the vertex without intravenous contrast. COMPARISON:  None. FINDINGS: Brain: No acute infarct or hemorrhage. Scattered hypodensities are seen within the periventricular white matter consistent with age-indeterminate small vessel ischemic change. Lateral ventricles and midline structures are unremarkable. No acute extra-axial fluid collections. No mass effect. Vascular: No hyperdense vessel or unexpected calcification. Skull: Normal. Negative for fracture or focal lesion. Sinuses/Orbits: No acute finding. Other: None. IMPRESSION: 1. Age indeterminate small vessel ischemic change within the periventricular white matter. 2. Otherwise no acute process. Electronically Signed   By: Randa Ngo M.D.   On: 08/14/2019 19:55   CT Chest Wo Contrast  Result Date: 08/15/2019 CLINICAL DATA:  Altered level of consciousness, constipation for 5 days, weakness EXAM: CT CHEST WITHOUT CONTRAST TECHNIQUE: Multidetector CT imaging of the chest was performed following the standard protocol without IV contrast. COMPARISON:  08/14/2019 FINDINGS:  Cardiovascular: Unenhanced images of the heart and great vessels demonstrates no pericardial effusion. Prominent calcification of the aortic valve. Extensive atherosclerosis of the aorta and coronary vessels. There is aneurysmal dilation of the ascending thoracic aorta measuring approximately 5 cm in diameter. Evaluation of the vascular lumen is limited without IV contrast. Mediastinum/Nodes: No enlarged mediastinal or axillary lymph nodes. Thyroid gland, trachea, and esophagus demonstrate no significant findings. Lungs/Pleura: There is upper lobe predominant emphysema. Minimal bronchiectasis is identified, greatest at the lung bases. Minimal tree in bud airspace disease within the right upper, right middle, and left upper lobe likely sequela of chronic indolent infection such as MAC. No other airspace disease, effusion, or pneumothorax. The central airways are otherwise patent. Upper Abdomen: Numerous renal cysts are partially visualized. No acute upper abdominal findings. Musculoskeletal: No acute or destructive bony lesions. Reconstructed images demonstrate no additional findings. IMPRESSION: 1. Ascending thoracic aortic aneurysm measuring 5 cm in diameter. Recommend semi-annual imaging followup by CTA or MRA and referral to cardiothoracic surgery if not already obtained. This recommendation follows 2010 ACCF/AHA/AATS/ACR/ASA/SCA/SCAI/SIR/STS/SVM Guidelines for the Diagnosis and Management of Patients With Thoracic Aortic Disease. Circulation. 2010; 121: B147-W295. Aortic aneurysm NOS (ICD10-I71.9) 2. Bronchiectasis with tree in bud airspace disease, likely sequela of chronic indolent infection such as MAC. 3. Aortic Atherosclerosis (ICD10-I70.0) and Emphysema (ICD10-J43.9). Electronically Signed   By: Randa Ngo M.D.   On: 08/15/2019 00:37   US ARTERIAL ABI (SCREENING LOWER EXTREMITY)  Result Date: 08/16/2019 CLINICAL DATA:  86 year old male with a history of claudication EXAM: NONINVASIVE PHYSIOLOGIC  VASCULAR STUDY OF BILATERAL LOWER EXTREMITIES TECHNIQUE: Evaluation of both lower extremities was performed at rest, including calculation of ankle-brachial indices, multiple segmental pressure evaluation, segmental Doppler and segmental pulse volume recording. COMPARISON:  None. FINDINGS: Right ABI:  1.2 Left ABI:  1.2 Right Lower Extremity: Segmental Doppler demonstrates triphasic waveforms at the ankle Left Lower Extremity: Segmental Doppler demonstrates triphasic waveforms at the ankle IMPRESSION: Resting ABI within  normal limits. Segmental exam demonstrates no significant arterial occlusive disease. Signed, Dulcy Fanny. Dellia Nims, RPVI [facility] Electronically Signed   By: Corrie Mckusick D.O.   On: 08/16/2019 12:42   DG OR UROLOGY CYSTO IMAGE ([place])  Result Date: 08/15/2019 There is no interpretation for this exam.  This order is for images obtained during a surgical procedure.  Please See "Surgeries" Tab for more information regarding the procedure.     Medications   Scheduled Meds: . aspirin EC  81 mg Oral Daily  . atorvastatin  80 mg Oral Daily  . enoxaparin (LOVENOX) injection  40 mg Subcutaneous Q24H  . guaiFENesin  600 mg Oral BID  . insulin aspart  0-9 Units Subcutaneous TID PC & HS  . insulin glargine  10 Units Subcutaneous Q2200  . polyethylene glycol  17 g Oral Daily   Continuous Infusions: . azithromycin Stopped (08/15/19 2355)  . meropenem (MERREM) IV 1 g (08/16/19 0958)       LOS: 1 day    Time spent: 30 minutes    Ezekiel Slocumb, DO [facility]  08/16/2019, 2:32 PM    If 7PM-7AM, please contact night-coverage. How to contact the [facility] Attending or Consulting provider [place] or covering provider during after hours [place], for this patient?    1. Check the care team in [facility] and look for a) attending/consulting [facility] provider listed and b) the [facility] team listed 2. Log into [email or web address] and use  [place]'s universal password to access. If you do not have the password, please contact the hospital operator. 3. Locate the [facility] provider you are looking for under [facility] and page to a number that you can be directly reached. 4. If you still have difficulty reaching the provider, please page the [facility] (Director on Call) for the Hospitalists listed on amion for assistance.

## 2019-08-16 NOTE — Progress Notes (Signed)
Urology Inpatient Progress Note  Subjective: Afebrile overnight.  Wife reports concerns about constipation x1 week and that he has not yet ambulated. WBC count down today, 12.6.  Creatinine down today, 1.62. Urine culture reintubated for bladder growth, on antibiotics as below. Foley catheter in place draining pink urine.  Mobile sediment noted in the catheter tubing.  Anti-infectives: Anti-infectives (From admission, onward)   Start     Dose/Rate Route Frequency Ordered Stop   08/15/19 2300  azithromycin (ZITHROMAX) 500 mg in sodium chloride 0.9 % 250 mL IVPB     Discontinue     500 mg 250 mL/hr over 60 Minutes Intravenous Every 24 hours 08/15/19 0547     08/15/19 1000  ceFEPIme (MAXIPIME) 2 g in sodium chloride 0.9 % 100 mL IVPB  Status:  Discontinued        2 g 200 mL/hr over 30 Minutes Intravenous Every 12 hours 08/15/19 0041 08/15/19 0226   08/15/19 1000  meropenem (MERREM) 1 g in sodium chloride 0.9 % 100 mL IVPB     Discontinue     1 g 200 mL/hr over 30 Minutes Intravenous Every 12 hours 08/15/19 0209     08/15/19 0030  ceFEPIme (MAXIPIME) 2 g in sodium chloride 0.9 % 100 mL IVPB        2 g 200 mL/hr over 30 Minutes Intravenous  Once 08/15/19 0021 08/15/19 0121   08/15/19 0030  azithromycin (ZITHROMAX) 500 mg in sodium chloride 0.9 % 250 mL IVPB  Status:  Discontinued        500 mg 250 mL/hr over 60 Minutes Intravenous Every 24 hours 08/15/19 0023 08/15/19 0556      Current Facility-Administered Medications  Medication Dose Route Frequency Provider Last Rate Last Admin  . 0.9 %  sodium chloride infusion   Intravenous Continuous Mansy, Jan A, MD 100 mL/hr at 08/15/19 0448 New Bag at 08/15/19 0448  . acetaminophen (TYLENOL) tablet 650 mg  650 mg Oral Q6H PRN Mansy, Jan A, MD       Or  . acetaminophen (TYLENOL) suppository 650 mg  650 mg Rectal Q6H PRN Mansy, Jan A, MD      . aspirin EC tablet 81 mg  81 mg Oral Daily Mansy, Jan A, MD   81 mg at 08/15/19 0919  . atorvastatin  (LIPITOR) tablet 80 mg  80 mg Oral Daily Mansy, Jan A, MD   80 mg at 08/15/19 1759  . azithromycin (ZITHROMAX) 500 mg in sodium chloride 0.9 % 250 mL IVPB  500 mg Intravenous Q24H Mansy, Arvella Merles, MD   Stopped at 08/15/19 2355  . Chlorhexidine Gluconate Cloth 2 % PADS 6 each  6 each Topical Daily Nicole Kindred A, DO      . enoxaparin (LOVENOX) injection 40 mg  40 mg Subcutaneous Q24H Nicole Kindred A, DO   40 mg at 08/16/19 0511  . guaiFENesin (MUCINEX) 12 hr tablet 600 mg  600 mg Oral BID Mansy, Jan A, MD   600 mg at 08/15/19 2122  . insulin aspart (novoLOG) injection 0-9 Units  0-9 Units Subcutaneous TID PC & HS Mansy, Arvella Merles, MD   1 Units at 08/15/19 1759  . insulin glargine (LANTUS) injection 10 Units  10 Units Subcutaneous Q2200 Mansy, Jan A, MD      . ipratropium-albuterol (DUONEB) 0.5-2.5 (3) MG/3ML nebulizer solution 3 mL  3 mL Nebulization Q4H PRN Mansy, Jan A, MD      . meropenem (MERREM) 1 g in sodium chloride  0.9 % 100 mL IVPB  1 g Intravenous Q12H Mansy, Arvella Merles, MD   Stopped at 08/15/19 2151  . ondansetron (ZOFRAN) tablet 4 mg  4 mg Oral Q6H PRN Mansy, Jan A, MD       Or  . ondansetron [facility]) injection 4 mg  4 mg Intravenous Q6H PRN Mansy, Jan A, MD      . traZODone (DESYREL) tablet 25 mg  25 mg Oral QHS PRN Mansy, Jan A, MD       Objective: Vital signs in last 24 hours: Temp:  [96.7 F (35.9 C)-99 F (37.2 C)] 99 F (37.2 C) (07/02 0454) Pulse Rate:  [60-70] 63 (07/02 0454) Resp:  [12-20] 20 (07/02 0454) BP: (92-131)/(54-78) 100/54 (07/02 0454) SpO2:  [92 %-100 %] 94 % (07/02 0454) Weight:  [103.9 kg] 103.9 kg (07/01 1130)  Intake/Output from previous day: 07/01 0701 - 07/02 0700 In: 1942.8 [I.V.:1500; IV Piggyback:442.8] Out: 1250 [Urine:1250] Intake/Output this shift: No intake/output data recorded.  Physical Exam Vitals and nursing note reviewed.  Constitutional:      General: He is not in acute distress.    Appearance: He is not ill-appearing, toxic-appearing or  diaphoretic.  HENT:     Head: Normocephalic and atraumatic.  Pulmonary:     Effort: Pulmonary effort is normal. No respiratory distress.  Skin:    General: Skin is warm and dry.  Neurological:     Mental Status: He is alert and oriented to person, place, and time.  Psychiatric:        Mood and Affect: Mood normal.        Behavior: Behavior normal.    Lab Results:  Recent Labs    08/15/19 0442 08/16/19 0658  WBC 16.8* 12.6*  HGB 11.0* 10.1*  HCT 33.4* 29.5*  PLT 230 230   BMET Recent Labs    08/15/19 0442 08/16/19 0658  NA 137 139  K 4.7 4.7  CL 103 109  CO2 27 25  GLUCOSE 167* 157*  BUN 40* 34*  CREATININE 2.09* 1.62*  CALCIUM 9.4 8.6*   PT/INR Recent Labs    08/14/19 2326  LABPROT 13.7  INR 1.1   Assessment & Plan: 86 year old male with PMH nephrolithiasis with remote history of lithotripsy admitted with weakness and found to have a partial left staghorn stone and obstructing 6 mm left proximal ureteral stone, now s/p left ureteral stent placement with Dr. Diamantina Providence.  Intraoperative findings notable for 2 bladder stones requiring concurrent cystolitholapaxy.  Labs improving on empiric antibiotics.  I irrigated the patient's catheter at the bedside this morning with approximately 750 mL of sterile water.  I was able to clear his urine from pink to clear with removal of approximately 5 cc of clot material.  Catheter irrigated with ease and was draining well at the conclusion of irrigation.  Okay to discontinue Foley today.  Okay to discharge from the urologic perspective on suppressive Macrobid with plans for outpatient follow-up in clinic next week to discuss definitive stone management.  With his recent history of constipation and findings of bladder stone intraoperatively, he will need to urinate before discharge as he is at elevated risk of urinary retention.  Recommendations: -Discontinue Foley -Ensure patient urinates prior to discharge -Discharge on Macrobid  100 mg daily x3 weeks -We will arrange outpatient follow-up  Debroah Loop, PA-C 08/16/2019

## 2019-08-16 NOTE — Discharge Summary (Signed)
Physician Discharge Summary  Shane Grant FFM:[id number] DOB: [birth year] DOA: 08/14/2019  PCP: Venia Carbon, MD  Admit date: 08/14/2019 Discharge date: 08/16/2019  Admitted From: home, [place] Disposition:  Home, [place]  Recommendations for Outpatient Follow-up:  1. Follow up with PCP in 1-2 weeks 2. Please obtain BMP/CBC in one week 3. Please follow up with urology  Home Health: Yes, PT and SW  Equipment/Devices: None   Discharge Condition: Stable  CODE STATUS: Full  Diet recommendation: Heart Healthy / Carb Modified    Discharge Diagnoses: Principal Problem:   Obstructive uropathy Active Problems:   Generalized weakness   Right leg pain   Type 2 diabetes, controlled, with neuropathy (HCC)   Hyperlipidemia   Aortic stenosis    Summary of HPI and Hospital Course:  Shane Grant  is a 86 y.o. Caucasian male with a known history of type 2 diabetes mellitus, dyslipidemia and hypertension, who presented to the emergency room with progressive generalized weakness for the last couple of days with associated altered mental status.  He had a fall the day before without injuries.  He has chronic urinary and bowel incontinence but yesterday urinated on himself multiple times.  He has been constipated having no bowel movements over the last 5 days.  No significant dysuria or hematuria or oliguria or flank pain.  His and wife's primary concern are his unsteady gait and leg weakness for the past month or so.    In the ED, mildly hypertensive at 149/75 with otherwise normal vital signs.  Labs revealed hyperglycemia 223, BUN 40, creatinine of 2.26 (from 1.4).  Urinalysis showed 6-10 WBCs and 30 protein with trace leukocytes.  CBC showed significant leukocytosis of 20.5 and hemoglobin of 12.5 with hematocrit of 36.5.  Blood and urine cultures were sent.  Head CT scan showed no acute process, chronic changes per report.  Chest x-ray negative. Chest CT without contrast showed  ascending thoracic aortic aneurysm measuring 5 cm in diameter with recommendation for semiannual imaging follow-up by CTA or MRA as well as bronchiectasis and aortic atherosclerosis and emphysema.  CT abdomen/pelvis revealed obstructing 6 mm proximal left ureteral calculus with significant left-sided obstructive uropathy, bilateral nonobstructing renal calculi and 3.3 cm indeterminate hypodensity in the right kidney that will need follow-up, and other non-pertinent findings per report.  He was treated with IV Cefepime and Zithromax, 1 L bolus LR, IV Zofran Admitted to hospitalist service with urology consulted for the obstructing stone causing AKI.      Obstructive uropathy - POA with AKI and obstructing left ureteral stone and partial left staghorn stone.  S/P cystolitholapaxy 3 cm and 2 cm bladder stones and left ureteral stent placement on 7/1 with Dr. Diamantina Providence.  Urology following.  D/C Foley this AM.  Pt is voiding.  Macrobid 119m PO daily x 3 weeks for suppressive therapy per urology.  Follow up with urology next week.        Generalized weakness - due to overall deconditioning and likely acute illness above.  PT evaluated and recommend SNF for rehab on discharge.  TOC consulted.  [facility] declined to go to SNF [facility]  Right leg pain - POA, recent history of right leg pain with ambulation, improves with rest, consistent with claudication.  No swelling relative to the left leg and no erythema or tenderness on palpation.  ABI's were obtained and normal.  Follow up with PCP for further evaluation.  Discharge Instructions   Discharge Instructions    Call MD for:   Complete by: As directed    Unable to urinate or bloody urine.   Call MD for:  extreme fatigue   Complete by: As directed    Call MD for:  persistant dizziness or light-headedness   Complete by: As directed    Call MD for:  severe uncontrolled pain   Complete by: As  directed    Call MD for:  temperature >100.4   Complete by: As directed    Diet - low sodium heart healthy   Complete by: As directed    Discharge instructions   Complete by: As directed    Please follow up with urology as planned.    Take Macrobid (aka nitrofurantoin) daily for 3 weeks to prevent infection.  Please be very cautious with walking to prevent falls.  Home health physical therapy has been ordered for you.  It will be very important for you to work with them, and continue the exercises in between their visits so you get stronger and more safe and steady with walking.   Increase activity slowly   Complete by: As directed    No wound care   Complete by: As directed      Allergies as of 08/16/2019      Reactions   Invokana [canagliflozin]    Raised sugars and he felt bad   Ancef [cefazolin Sodium] Nausea Only      Medication List    TAKE these medications   aspirin 81 MG tablet Take 81 mg by mouth daily.   atorvastatin 80 MG tablet Commonly known as: LIPITOR Take 1 tablet (80 mg total) by mouth daily.   glipiZIDE 5 MG tablet Commonly known as: GLUCOTROL Take 1 tablet (5 mg total) by mouth 2 (two) times daily before a meal.   Lantus SoloStar 100 UNIT/ML Solostar Pen Generic drug: insulin glargine INJECT 10 UNITS SUBCUTANEOUSLY ONCE DAILY   metFORMIN 1000 MG tablet Commonly known as: GLUCOPHAGE Take 1 tablet (1,000 mg total) by mouth 2 (two) times daily with a meal.   nitrofurantoin (macrocrystal-monohydrate) 100 MG capsule Commonly known as: Macrobid Take 1 capsule (100 mg total) by mouth daily for 21 days.       Allergies  Allergen Reactions  . Invokana [Canagliflozin]     Raised sugars and he felt bad  . Ancef [Cefazolin Sodium] Nausea Only    Consultations:  Urology    Procedures/Studies: CT ABDOMEN PELVIS WO CONTRAST  Result Date: 08/14/2019 CLINICAL DATA:  Abdominal pain, constipation and weakness for 5 days EXAM: CT ABDOMEN AND PELVIS  WITHOUT CONTRAST TECHNIQUE: Multidetector CT imaging of the abdomen and pelvis was performed following the standard protocol without IV contrast. COMPARISON:  None. FINDINGS: Lower chest: Scattered tree in bud ground-glass airspace disease is seen within the right middle lobe and left lower lobe, consistent with infection or nonspecific inflammation. No effusion or pneumothorax. Hepatobiliary: Calcified gallstones are identified without cholecystitis. Liver is unremarkable. Pancreas: Unremarkable. No pancreatic ductal dilatation or surrounding inflammatory changes. Spleen: Normal in size without focal abnormality. Adrenals/Urinary Tract: Numerous calculi in a staghorn configuration are seen within the left kidney, measuring up to 3.4 cm in greatest dimension. There is an obstructing 6 mm proximal left ureteral calculus with significant left-sided obstructive uropathy. Multiple nonobstructing right renal calculi are visualized measuring up to 7 mm in size. There are numerous bilateral simple renal cysts. Within the medial interpolar region of the right kidney there  is a 3.3 cm indeterminate hypodensity with nonspecific central calcifications. Underlying renal cell carcinoma cannot be excluded, and nonemergent follow-up dedicated renal MRI may be useful. The adrenals are unremarkable. The bladder is moderately distended without focal abnormality. Stomach/Bowel: No bowel obstruction or ileus. Postsurgical changes from right hemicolectomy. No bowel wall thickening or inflammatory change. Moderate stool within the rectal vault. Vascular/Lymphatic: Aortic atherosclerosis. No enlarged abdominal or pelvic lymph nodes. Reproductive: Prostate is unremarkable. Other: No abdominal wall hernia or abnormality. No abdominopelvic ascites. Musculoskeletal: No acute or destructive bony lesions. Reconstructed images demonstrate no additional findings. IMPRESSION: 1. Obstructing 6 mm proximal left ureteral calculus with significant  left-sided obstructive uropathy. 2. Bilateral nonobstructing renal calculi. 3. A 3.3 cm indeterminate hypodensity right kidney. Underlying renal cell carcinoma cannot be excluded. Follow-up dedicated renal MRI may be useful. 4. Cholelithiasis without cholecystitis. 5. Scattered tree in bud ground-glass airspace disease within the right middle lobe and left lower lobe, consistent with infection or nonspecific inflammation. 6. Aortic Atherosclerosis (ICD10-I70.0). Electronically Signed   By: Randa Ngo M.D.   On: 08/14/2019 23:46   DG Chest 2 View  Result Date: 08/14/2019 CLINICAL DATA:  Constipation for 5 days, weakness EXAM: CHEST - 2 VIEW COMPARISON:  None. FINDINGS: Frontal and lateral views of the chest demonstrate an unremarkable cardiac silhouette. Atherosclerosis of the aortic arch. No acute airspace disease, effusion, or pneumothorax. No acute bony abnormalities. IMPRESSION: 1. No acute intrathoracic process. Electronically Signed   By: Randa Ngo M.D.   On: 08/14/2019 19:52   CT Head Wo Contrast  Result Date: 08/14/2019 CLINICAL DATA:  Weakness, altered level of consciousness EXAM: CT HEAD WITHOUT CONTRAST TECHNIQUE: Contiguous axial images were obtained from the base of the skull through the vertex without intravenous contrast. COMPARISON:  None. FINDINGS: Brain: No acute infarct or hemorrhage. Scattered hypodensities are seen within the periventricular white matter consistent with age-indeterminate small vessel ischemic change. Lateral ventricles and midline structures are unremarkable. No acute extra-axial fluid collections. No mass effect. Vascular: No hyperdense vessel or unexpected calcification. Skull: Normal. Negative for fracture or focal lesion. Sinuses/Orbits: No acute finding. Other: None. IMPRESSION: 1. Age indeterminate small vessel ischemic change within the periventricular white matter. 2. Otherwise no acute process. Electronically Signed   By: Randa Ngo M.D.   On:  08/14/2019 19:55   CT Chest Wo Contrast  Result Date: 08/15/2019 CLINICAL DATA:  Altered level of consciousness, constipation for 5 days, weakness EXAM: CT CHEST WITHOUT CONTRAST TECHNIQUE: Multidetector CT imaging of the chest was performed following the standard protocol without IV contrast. COMPARISON:  08/14/2019 FINDINGS: Cardiovascular: Unenhanced images of the heart and great vessels demonstrates no pericardial effusion. Prominent calcification of the aortic valve. Extensive atherosclerosis of the aorta and coronary vessels. There is aneurysmal dilation of the ascending thoracic aorta measuring approximately 5 cm in diameter. Evaluation of the vascular lumen is limited without IV contrast. Mediastinum/Nodes: No enlarged mediastinal or axillary lymph nodes. Thyroid gland, trachea, and esophagus demonstrate no significant findings. Lungs/Pleura: There is upper lobe predominant emphysema. Minimal bronchiectasis is identified, greatest at the lung bases. Minimal tree in bud airspace disease within the right upper, right middle, and left upper lobe likely sequela of chronic indolent infection such as MAC. No other airspace disease, effusion, or pneumothorax. The central airways are otherwise patent. Upper Abdomen: Numerous renal cysts are partially visualized. No acute upper abdominal findings. Musculoskeletal: No acute or destructive bony lesions. Reconstructed images demonstrate no additional findings. IMPRESSION: 1. Ascending thoracic aortic aneurysm measuring  5 cm in diameter. Recommend semi-annual imaging followup by CTA or MRA and referral to cardiothoracic surgery if not already obtained. This recommendation follows 2010 ACCF/AHA/AATS/ACR/ASA/SCA/SCAI/SIR/STS/SVM Guidelines for the Diagnosis and Management of Patients With Thoracic Aortic Disease. Circulation. 2010; 121: Z610-R604. Aortic aneurysm NOS (ICD10-I71.9) 2. Bronchiectasis with tree in bud airspace disease, likely sequela of chronic indolent  infection such as MAC. 3. Aortic Atherosclerosis (ICD10-I70.0) and Emphysema (ICD10-J43.9). Electronically Signed   By: Randa Ngo M.D.   On: 08/15/2019 00:37   US ARTERIAL ABI (SCREENING LOWER EXTREMITY)  Result Date: 08/16/2019 CLINICAL DATA:  86 year old male with a history of claudication EXAM: NONINVASIVE PHYSIOLOGIC VASCULAR STUDY OF BILATERAL LOWER EXTREMITIES TECHNIQUE: Evaluation of both lower extremities was performed at rest, including calculation of ankle-brachial indices, multiple segmental pressure evaluation, segmental Doppler and segmental pulse volume recording. COMPARISON:  None. FINDINGS: Right ABI:  1.2 Left ABI:  1.2 Right Lower Extremity: Segmental Doppler demonstrates triphasic waveforms at the ankle Left Lower Extremity: Segmental Doppler demonstrates triphasic waveforms at the ankle IMPRESSION: Resting ABI within normal limits. Segmental exam demonstrates no significant arterial occlusive disease. Signed, Dulcy Fanny. Dellia Nims, RPVI [facility] Electronically Signed   By: Corrie Mckusick D.O.   On: 08/16/2019 12:42   DG OR UROLOGY CYSTO IMAGE ([place])  Result Date: 08/15/2019 There is no interpretation for this exam.  This order is for images obtained during a surgical procedure.  Please See "Surgeries" Tab for more information regarding the procedure.      Left ureteral stent placement and bladder stone removal on 08/15/19   Subjective: Patient seen at bedside.  He denies complaints other than his chronic right leg pain. Asked if he would consider rehab to get stronger but he again declines.  Denies fever/chills, CP, SOB or other complaints.     Discharge Exam: Vitals:   08/16/19 0454 08/16/19 1224  BP: (!) 100/54 (!) 144/79  Pulse: 63 80  Resp: 20 16  Temp: 99 F (37.2 C) 99.3 F (37.4 C)  SpO2: 94% 93%   Vitals:   08/15/19 2039 08/16/19 0017 08/16/19 0454 08/16/19 1224  BP: (!) 92/55 (!) 94/55 (!)  100/54 (!) 144/79  Pulse: 63 67 63 80  Resp: _0 Temp: 98.3 F (36.8 C) 98.6 F (37 C) 99 F (37.2 C) 99.3 F (37.4 C)  TempSrc: Oral Oral Oral Oral  SpO2: 92% 97% 94% 93%  Weight:      Height:        General: Pt is alert, awake, not in acute distress Cardiovascular: RRR, S1/S2 +, no rubs, no gallops Respiratory: CTA bilaterally, no wheezing, no rhonchi Abdominal: Soft, NT, ND, bowel sounds + Extremities: no edema, no cyanosis    The results of significant diagnostics from this hospitalization (including imaging, microbiology, ancillary and laboratory) are listed below for reference.     Microbiology: Recent Results (from the past 240 hour(s))  Blood Culture (routine x 2)     Status: None (Preliminary result)   Collection Time: 08/14/19 11:26 PM   Specimen: BLOOD LEFT FOREARM  Result Value Ref Range Status   Specimen Description BLOOD LEFT FOREARM  Final   Special Requests   Final    BOTTLES DRAWN AEROBIC AND ANAEROBIC Blood Culture adequate volume   Culture   Final    NO GROWTH 2 DAYS Performed at [facility], [address] [place], [place] [address]    Report Status PENDING  Incomplete  Blood Culture (routine x 2)     Status: None (Preliminary result)   Collection Time: 08/14/19 11:26 PM   Specimen: Right Antecubital; Blood  Result Value Ref Range Status   Specimen Description RIGHT ANTECUBITAL  Final   Special Requests   Final    BOTTLES DRAWN AEROBIC AND ANAEROBIC Blood Culture adequate volume   Culture   Final    NO GROWTH 2 DAYS Performed at [facility], [address], [place], [place] [address]    Report Status PENDING  Incomplete  Urine culture     Status: Abnormal (Preliminary result)   Collection Time: 08/14/19 11:26 PM   Specimen: In/Out Cath Urine  Result Value Ref Range Status   Specimen Description   Final    IN/OUT CATH URINE Performed at [facility], [address], [place], [place] [address]     Special Requests   Final    Normal Performed at [facility], [place], [place] [address]    Culture 7,000 COLONIES/mL ENTEROCOCCUS FAECALIS (A)  Final   Report Status PENDING  Incomplete  SARS Coronavirus 2 by RT PCR (hospital order, performed in [place] hospital lab) Nasopharyngeal Nasopharyngeal Swab     Status: None   Collection Time: 08/14/19 11:32 PM   Specimen: Nasopharyngeal Swab  Result Value Ref Range Status   SARS Coronavirus 2 NEGATIVE NEGATIVE Final    Comment: (NOTE) SARS-CoV-2 target nucleic acids are NOT DETECTED.  The SARS-CoV-2 RNA is generally detectable in upper and lower respiratory specimens during the acute phase of infection. The lowest concentration of SARS-CoV-2 viral copies this assay can detect is 250 copies / mL. A negative result does not preclude SARS-CoV-2 infection and should not be used as the sole basis for treatment or other patient management decisions.  A negative result may occur with improper specimen collection / handling, submission of specimen other than nasopharyngeal swab, presence of viral mutation(s) within the areas targeted by this assay, and inadequate number of viral copies (<250 copies / mL). A negative result must be combined with clinical observations, patient history, and epidemiological information.  Fact Sheet for Patients:   StrictlyIdeas.no  Fact Sheet for Healthcare Providers: BankingDealers.co.za  This test is not yet approved or  cleared by the [place] FDA and has been authorized for detection and/or diagnosis of SARS-CoV-2 by FDA under an Emergency Use Authorization (EUA).  This EUA will remain in effect (meaning this test can be used) for the duration of the COVID-19 declaration under Section 564(b)(1) of the Act, 21 U.S.C. section 360bbb-3(b)(1), unless the authorization is terminated or revoked sooner.  Performed at [facility], [address] [place], [place] [address]      Labs: BNP (last 3 results) No results for input(s): BNP in the last 8760 hours. Basic Metabolic Panel: Recent Labs  Lab 08/14/19 1924 08/14/19 2326 08/15/19 0442 08/16/19 0658  NA 135  --  137 139  K 4.9  --  4.7 4.7  CL 99  --  103 109  CO2 24  --  27 25  GLUCOSE 223*  --  167* 157*  BUN 40*  --  40* 34*  CREATININE 2.26*  --  2.09* 1.62*  CALCIUM 9.7  --  9.4 8.6*  MG  --  2.3  --   --    Liver Function Tests: Recent Labs  Lab 08/14/19 2326  AST 30  ALT 37  ALKPHOS 105  BILITOT 1.4*  PROT 7.4  ALBUMIN 3.6   Recent Labs  Lab 08/14/19 2326  LIPASE 25   No results for input(s): AMMONIA in the last 168 hours. CBC: Recent Labs  Lab 08/14/19 1924 08/15/19 0442 08/16/19 0658  WBC 20.5* 16.8* 12.6*  HGB 12.5* 11.0* 10.1*  HCT 36.5* 33.4* 29.5*  MCV 88.8 90.3 88.6  PLT 266 230 230   Cardiac Enzymes: Recent Labs  Lab 08/14/19 1924  CKTOTAL 80   BNP: Invalid input(s): POCBNP CBG: Recent Labs  Lab 08/15/19 1350 08/15/19 1640 08/15/19 2058 08/16/19 0753 08/16/19 1220  GLUCAP 118* 123* 190* 154* 192*   D-Dimer No results for input(s): DDIMER in the last 72 hours. Hgb A1c Recent Labs    08/15/19 0442  HGBA1C 8.1*   Lipid Profile No results for input(s): CHOL, HDL, LDLCALC, TRIG, CHOLHDL, LDLDIRECT in the last 72 hours. Thyroid function studies No results for input(s): TSH, T4TOTAL, T3FREE, THYROIDAB in the last 72 hours.  Invalid input(s): FREET3 Anemia work up No results for input(s): VITAMINB12, FOLATE, FERRITIN, TIBC, IRON, RETICCTPCT in the last 72 hours. Urinalysis    Component Value Date/Time   COLORURINE YELLOW (A) 08/14/2019 2326   APPEARANCEUR HAZY (A) 08/14/2019 2326   LABSPEC 1.015 08/14/2019 2326   PHURINE 5.0 08/14/2019 2326   GLUCOSEU NEGATIVE 08/14/2019 2326   HGBUR SMALL (A) 08/14/2019 2326   BILIRUBINUR NEGATIVE 08/14/2019 2326   KETONESUR NEGATIVE  08/14/2019 2326   PROTEINUR 30 (A) 08/14/2019 2326   NITRITE NEGATIVE 08/14/2019 2326   LEUKOCYTESUR TRACE (A) 08/14/2019 2326   Sepsis Labs Invalid input(s): PROCALCITONIN,  WBC,  LACTICIDVEN Microbiology Recent Results (from the past 240 hour(s))  Blood Culture (routine x 2)     Status: None (Preliminary result)   Collection Time: 08/14/19 11:26 PM   Specimen: BLOOD LEFT FOREARM  Result Value Ref Range Status   Specimen Description BLOOD LEFT FOREARM  Final   Special Requests   Final    BOTTLES DRAWN AEROBIC AND ANAEROBIC Blood Culture adequate volume   Culture   Final    NO GROWTH 2 DAYS Performed at [facility], [address], [place], [place] [address]    Report Status PENDING  Incomplete  Blood Culture (routine x 2)     Status: None (Preliminary result)   Collection Time: 08/14/19 11:26 PM   Specimen: Right Antecubital; Blood  Result Value Ref Range Status   Specimen Description RIGHT ANTECUBITAL  Final   Special Requests   Final    BOTTLES DRAWN AEROBIC AND ANAEROBIC Blood Culture adequate volume   Culture   Final    NO GROWTH 2 DAYS Performed at [facility], [address] [place], [place] [address]    Report Status PENDING  Incomplete  Urine culture     Status: Abnormal (Preliminary result)   Collection Time: 08/14/19 11:26 PM   Specimen: In/Out Cath Urine  Result Value Ref Range Status   Specimen Description   Final    IN/OUT CATH URINE Performed at [facility], [address], [place], [place] [address]    Special Requests   Final    Normal Performed at [facility], [place],  [address]    Culture 7,000 COLONIES/mL ENTEROCOCCUS FAECALIS (A)  Final   Report Status PENDING  Incomplete  SARS Coronavirus 2 by RT PCR (hospital order, performed in [place] hospital lab) Nasopharyngeal Nasopharyngeal Swab     Status: None   Collection Time: 08/14/19 11:32 PM  Specimen: Nasopharyngeal Swab   Result Value Ref Range Status   SARS Coronavirus 2 NEGATIVE NEGATIVE Final    Comment: (NOTE) SARS-CoV-2 target nucleic acids are NOT DETECTED.  The SARS-CoV-2 RNA is generally detectable in upper and lower respiratory specimens during the acute phase of infection. The lowest concentration of SARS-CoV-2 viral copies this assay can detect is 250 copies / mL. A negative result does not preclude SARS-CoV-2 infection and should not be used as the sole basis for treatment or other patient management decisions.  A negative result may occur with improper specimen collection / handling, submission of specimen other than nasopharyngeal swab, presence of viral mutation(s) within the areas targeted by this assay, and inadequate number of viral copies (<250 copies / mL). A negative result must be combined with clinical observations, patient history, and epidemiological information.  Fact Sheet for Patients:   StrictlyIdeas.no  Fact Sheet for Healthcare Providers: BankingDealers.co.za  This test is not yet approved or  cleared by the [place] FDA and has been authorized for detection and/or diagnosis of SARS-CoV-2 by FDA under an Emergency Use Authorization (EUA).  This EUA will remain in effect (meaning this test can be used) for the duration of the COVID-19 declaration under Section 564(b)(1) of the Act, 21 U.S.C. section 360bbb-3(b)(1), unless the authorization is terminated or revoked sooner.  Performed at [facility], [address] [place], [place] [address]      Time coordinating discharge: Over 30 minutes  SIGNED:   Ezekiel Slocumb, DO [facility] 08/16/2019, 4:34 PM   If 7PM-7AM, please contact night-coverage [email or web address]

## 2019-08-16 NOTE — Hospital Course (Addendum)
Shane Grant  is a 86 y.o. Caucasian male with a known history of type 2 diabetes mellitus, dyslipidemia and hypertension, who presented to the emergency room with progressive generalized weakness for the last couple of days with associated altered mental status.  He had a fall the day before without injuries.  He has chronic urinary and bowel incontinence but yesterday urinated on himself multiple times.  He has been constipated having no bowel movements over the last 5 days.  No significant dysuria or hematuria or oliguria or flank pain.  His and wife's primary concern are his unsteady gait and leg weakness for the past month or so.    In the ED, mildly hypertensive at 149/75 with otherwise normal vital signs.  Labs revealed hyperglycemia 223, BUN 40, creatinine of 2.26 (from 1.4).  Urinalysis showed 6-10 WBCs and 30 protein with trace leukocytes.  CBC showed significant leukocytosis of 20.5 and hemoglobin of 12.5 with hematocrit of 36.5.  Blood and urine cultures were sent.  Head CT scan showed no acute process, chronic changes per report.  Chest x-ray negative. Chest CT without contrast showed ascending thoracic aortic aneurysm measuring 5 cm in diameter with recommendation for semiannual imaging follow-up by CTA or MRA as well as bronchiectasis and aortic atherosclerosis and emphysema.  CT abdomen/pelvis revealed obstructing 6 mm proximal left ureteral calculus with significant left-sided obstructive uropathy, bilateral nonobstructing renal calculi and 3.3 cm indeterminate hypodensity in the right kidney that will need follow-up, and other non-pertinent findings per report.  He was treated with IV Cefepime and Zithromax, 1 L bolus LR, IV Zofran Admitted to hospitalist service with urology consulted for the obstructing stone causing AKI.

## 2019-08-16 NOTE — Telephone Encounter (Signed)
-----   Message from Billey Co, MD sent at 08/15/2019  4:38 PM EDT ----- Regarding: follow up Please schedule follow up in clinic with me next week to discuss stone removal options, thanks. He is still in the hospital, please call Friday thanks  Nickolas Madrid, MD 08/15/2019

## 2019-08-17 LAB — URINE CULTURE
Culture: 7000 — AB
Special Requests: NORMAL

## 2019-08-19 LAB — CULTURE, BLOOD (ROUTINE X 2)
Culture: NO GROWTH
Culture: NO GROWTH
Special Requests: ADEQUATE
Special Requests: ADEQUATE

## 2019-08-20 ENCOUNTER — Telehealth: Payer: Self-pay | Admitting: Urology

## 2019-08-20 DIAGNOSIS — R262 Difficulty in walking, not elsewhere classified: Secondary | ICD-10-CM | POA: Diagnosis not present

## 2019-08-20 DIAGNOSIS — R6521 Severe sepsis with septic shock: Secondary | ICD-10-CM | POA: Diagnosis not present

## 2019-08-20 DIAGNOSIS — N401 Enlarged prostate with lower urinary tract symptoms: Secondary | ICD-10-CM | POA: Diagnosis not present

## 2019-08-20 DIAGNOSIS — R278 Other lack of coordination: Secondary | ICD-10-CM | POA: Diagnosis not present

## 2019-08-20 DIAGNOSIS — N202 Calculus of kidney with calculus of ureter: Secondary | ICD-10-CM | POA: Diagnosis not present

## 2019-08-20 DIAGNOSIS — E1140 Type 2 diabetes mellitus with diabetic neuropathy, unspecified: Secondary | ICD-10-CM | POA: Diagnosis not present

## 2019-08-20 DIAGNOSIS — Z87891 Personal history of nicotine dependence: Secondary | ICD-10-CM | POA: Diagnosis not present

## 2019-08-20 DIAGNOSIS — Z7189 Other specified counseling: Secondary | ICD-10-CM | POA: Diagnosis not present

## 2019-08-20 DIAGNOSIS — I350 Nonrheumatic aortic (valve) stenosis: Secondary | ICD-10-CM | POA: Diagnosis not present

## 2019-08-20 DIAGNOSIS — I77810 Thoracic aortic ectasia: Secondary | ICD-10-CM | POA: Diagnosis not present

## 2019-08-20 DIAGNOSIS — E872 Acidosis: Secondary | ICD-10-CM | POA: Diagnosis not present

## 2019-08-20 DIAGNOSIS — E11649 Type 2 diabetes mellitus with hypoglycemia without coma: Secondary | ICD-10-CM | POA: Diagnosis present

## 2019-08-20 DIAGNOSIS — R4189 Other symptoms and signs involving cognitive functions and awareness: Secondary | ICD-10-CM | POA: Diagnosis not present

## 2019-08-20 DIAGNOSIS — Z85038 Personal history of other malignant neoplasm of large intestine: Secondary | ICD-10-CM | POA: Diagnosis not present

## 2019-08-20 DIAGNOSIS — Z48816 Encounter for surgical aftercare following surgery on the genitourinary system: Secondary | ICD-10-CM | POA: Diagnosis not present

## 2019-08-20 DIAGNOSIS — I10 Essential (primary) hypertension: Secondary | ICD-10-CM | POA: Diagnosis not present

## 2019-08-20 DIAGNOSIS — R0902 Hypoxemia: Secondary | ICD-10-CM | POA: Diagnosis not present

## 2019-08-20 DIAGNOSIS — I129 Hypertensive chronic kidney disease with stage 1 through stage 4 chronic kidney disease, or unspecified chronic kidney disease: Secondary | ICD-10-CM | POA: Diagnosis not present

## 2019-08-20 DIAGNOSIS — N189 Chronic kidney disease, unspecified: Secondary | ICD-10-CM | POA: Diagnosis not present

## 2019-08-20 DIAGNOSIS — J479 Bronchiectasis, uncomplicated: Secondary | ICD-10-CM | POA: Diagnosis not present

## 2019-08-20 DIAGNOSIS — E119 Type 2 diabetes mellitus without complications: Secondary | ICD-10-CM | POA: Diagnosis not present

## 2019-08-20 DIAGNOSIS — J181 Lobar pneumonia, unspecified organism: Secondary | ICD-10-CM | POA: Diagnosis not present

## 2019-08-20 DIAGNOSIS — Z9049 Acquired absence of other specified parts of digestive tract: Secondary | ICD-10-CM | POA: Diagnosis not present

## 2019-08-20 DIAGNOSIS — I9589 Other hypotension: Secondary | ICD-10-CM | POA: Diagnosis not present

## 2019-08-20 DIAGNOSIS — I4891 Unspecified atrial fibrillation: Secondary | ICD-10-CM | POA: Diagnosis not present

## 2019-08-20 DIAGNOSIS — N1330 Unspecified hydronephrosis: Secondary | ICD-10-CM | POA: Diagnosis not present

## 2019-08-20 DIAGNOSIS — Z888 Allergy status to other drugs, medicaments and biological substances status: Secondary | ICD-10-CM | POA: Diagnosis not present

## 2019-08-20 DIAGNOSIS — I719 Aortic aneurysm of unspecified site, without rupture: Secondary | ICD-10-CM | POA: Diagnosis not present

## 2019-08-20 DIAGNOSIS — N139 Obstructive and reflux uropathy, unspecified: Secondary | ICD-10-CM | POA: Diagnosis not present

## 2019-08-20 DIAGNOSIS — R531 Weakness: Secondary | ICD-10-CM | POA: Diagnosis not present

## 2019-08-20 DIAGNOSIS — Z741 Need for assistance with personal care: Secondary | ICD-10-CM | POA: Diagnosis not present

## 2019-08-20 DIAGNOSIS — Z9842 Cataract extraction status, left eye: Secondary | ICD-10-CM | POA: Diagnosis not present

## 2019-08-20 DIAGNOSIS — N201 Calculus of ureter: Secondary | ICD-10-CM | POA: Diagnosis not present

## 2019-08-20 DIAGNOSIS — E785 Hyperlipidemia, unspecified: Secondary | ICD-10-CM | POA: Diagnosis not present

## 2019-08-20 DIAGNOSIS — Z7984 Long term (current) use of oral hypoglycemic drugs: Secondary | ICD-10-CM | POA: Diagnosis not present

## 2019-08-20 DIAGNOSIS — F05 Delirium due to known physiological condition: Secondary | ICD-10-CM | POA: Diagnosis not present

## 2019-08-20 DIAGNOSIS — I480 Paroxysmal atrial fibrillation: Secondary | ICD-10-CM | POA: Diagnosis not present

## 2019-08-20 DIAGNOSIS — K810 Acute cholecystitis: Secondary | ICD-10-CM | POA: Diagnosis present

## 2019-08-20 DIAGNOSIS — N2889 Other specified disorders of kidney and ureter: Secondary | ICD-10-CM | POA: Diagnosis not present

## 2019-08-20 DIAGNOSIS — Z87442 Personal history of urinary calculi: Secondary | ICD-10-CM | POA: Diagnosis not present

## 2019-08-20 DIAGNOSIS — N200 Calculus of kidney: Secondary | ICD-10-CM | POA: Diagnosis not present

## 2019-08-20 DIAGNOSIS — N132 Hydronephrosis with renal and ureteral calculous obstruction: Secondary | ICD-10-CM | POA: Diagnosis not present

## 2019-08-20 DIAGNOSIS — N138 Other obstructive and reflux uropathy: Secondary | ICD-10-CM | POA: Diagnosis present

## 2019-08-20 DIAGNOSIS — N390 Urinary tract infection, site not specified: Secondary | ICD-10-CM | POA: Diagnosis present

## 2019-08-20 DIAGNOSIS — R339 Retention of urine, unspecified: Secondary | ICD-10-CM | POA: Diagnosis not present

## 2019-08-20 DIAGNOSIS — I712 Thoracic aortic aneurysm, without rupture: Secondary | ICD-10-CM | POA: Diagnosis not present

## 2019-08-20 DIAGNOSIS — E1040 Type 1 diabetes mellitus with diabetic neuropathy, unspecified: Secondary | ICD-10-CM | POA: Diagnosis not present

## 2019-08-20 DIAGNOSIS — J690 Pneumonitis due to inhalation of food and vomit: Secondary | ICD-10-CM | POA: Diagnosis not present

## 2019-08-20 DIAGNOSIS — Z20822 Contact with and (suspected) exposure to covid-19: Secondary | ICD-10-CM | POA: Diagnosis present

## 2019-08-20 DIAGNOSIS — Z9841 Cataract extraction status, right eye: Secondary | ICD-10-CM | POA: Diagnosis not present

## 2019-08-20 DIAGNOSIS — N4289 Other specified disorders of prostate: Secondary | ICD-10-CM | POA: Diagnosis not present

## 2019-08-20 DIAGNOSIS — Z801 Family history of malignant neoplasm of trachea, bronchus and lung: Secondary | ICD-10-CM | POA: Diagnosis not present

## 2019-08-20 DIAGNOSIS — Z79899 Other long term (current) drug therapy: Secondary | ICD-10-CM | POA: Diagnosis not present

## 2019-08-20 DIAGNOSIS — K5641 Fecal impaction: Secondary | ICD-10-CM | POA: Diagnosis not present

## 2019-08-20 DIAGNOSIS — E1142 Type 2 diabetes mellitus with diabetic polyneuropathy: Secondary | ICD-10-CM | POA: Diagnosis not present

## 2019-08-20 DIAGNOSIS — Z66 Do not resuscitate: Secondary | ICD-10-CM | POA: Diagnosis not present

## 2019-08-20 DIAGNOSIS — Z01812 Encounter for preprocedural laboratory examination: Secondary | ICD-10-CM | POA: Diagnosis not present

## 2019-08-20 DIAGNOSIS — M6281 Muscle weakness (generalized): Secondary | ICD-10-CM | POA: Diagnosis not present

## 2019-08-20 DIAGNOSIS — E161 Other hypoglycemia: Secondary | ICD-10-CM | POA: Diagnosis not present

## 2019-08-20 DIAGNOSIS — R296 Repeated falls: Secondary | ICD-10-CM | POA: Diagnosis not present

## 2019-08-20 DIAGNOSIS — G9341 Metabolic encephalopathy: Secondary | ICD-10-CM | POA: Diagnosis present

## 2019-08-20 DIAGNOSIS — Z515 Encounter for palliative care: Secondary | ICD-10-CM | POA: Diagnosis not present

## 2019-08-20 DIAGNOSIS — R6520 Severe sepsis without septic shock: Secondary | ICD-10-CM | POA: Diagnosis not present

## 2019-08-20 DIAGNOSIS — E861 Hypovolemia: Secondary | ICD-10-CM | POA: Diagnosis not present

## 2019-08-20 DIAGNOSIS — B351 Tinea unguium: Secondary | ICD-10-CM | POA: Diagnosis not present

## 2019-08-20 DIAGNOSIS — E1022 Type 1 diabetes mellitus with diabetic chronic kidney disease: Secondary | ICD-10-CM | POA: Diagnosis not present

## 2019-08-20 DIAGNOSIS — N179 Acute kidney failure, unspecified: Secondary | ICD-10-CM | POA: Diagnosis not present

## 2019-08-20 DIAGNOSIS — R0602 Shortness of breath: Secondary | ICD-10-CM | POA: Diagnosis not present

## 2019-08-20 DIAGNOSIS — E162 Hypoglycemia, unspecified: Secondary | ICD-10-CM | POA: Diagnosis not present

## 2019-08-20 DIAGNOSIS — J9801 Acute bronchospasm: Secondary | ICD-10-CM | POA: Diagnosis not present

## 2019-08-20 DIAGNOSIS — Z961 Presence of intraocular lens: Secondary | ICD-10-CM | POA: Diagnosis present

## 2019-08-20 DIAGNOSIS — A419 Sepsis, unspecified organism: Secondary | ICD-10-CM | POA: Diagnosis not present

## 2019-08-20 DIAGNOSIS — R32 Unspecified urinary incontinence: Secondary | ICD-10-CM | POA: Diagnosis not present

## 2019-08-20 DIAGNOSIS — R05 Cough: Secondary | ICD-10-CM | POA: Diagnosis not present

## 2019-08-20 NOTE — Telephone Encounter (Signed)
Pt.'s wife called and left a message to inform Dr. Diamantina Providence pt. is now a resident of skilled nursing [facility]) Wife states pt. Will not be able to come in for appointment this week and is not sure when she will be able to reschedule. Pt. Has a stent placed and will need to discuss stone management.Wife seemed very upset and unsure what to do at this time due to pt.'s condition.She  would like for someone to call her at the home phone number [phone] to discuss stone management and if this needs to be set up through [facility]. Please advise.

## 2019-08-20 NOTE — Telephone Encounter (Signed)
Pt wife is calling to Inform Dr. Diamantina Providence pt will not be able to follow up with Dr. Diamantina Providence this week due to him falling again his legs are weak and he is in therapy at [facility]Pt will call and schedule  f/u when he is better.

## 2019-08-26 ENCOUNTER — Other Ambulatory Visit: Payer: Self-pay | Admitting: *Deleted

## 2019-08-26 DIAGNOSIS — N139 Obstructive and reflux uropathy, unspecified: Secondary | ICD-10-CM

## 2019-08-27 ENCOUNTER — Encounter: Payer: Self-pay | Admitting: Urology

## 2019-08-27 ENCOUNTER — Other Ambulatory Visit
Admission: RE | Admit: 2019-08-27 | Discharge: 2019-08-27 | Disposition: A | Discharge status: Home / Self Care | Payer: Medicare Other | Attending: Urology | Admitting: Urology

## 2019-08-27 ENCOUNTER — Other Ambulatory Visit: Payer: Self-pay

## 2019-08-27 ENCOUNTER — Ambulatory Visit (INDEPENDENT_AMBULATORY_CARE_PROVIDER_SITE_OTHER): Payer: Medicare Other | Admitting: Urology

## 2019-08-27 VITALS — BP 93/56 | HR 88 | Ht 78.0 in | Wt 228.0 lb

## 2019-08-27 DIAGNOSIS — R32 Unspecified urinary incontinence: Secondary | ICD-10-CM

## 2019-08-27 DIAGNOSIS — N200 Calculus of kidney: Secondary | ICD-10-CM

## 2019-08-27 DIAGNOSIS — N2889 Other specified disorders of kidney and ureter: Secondary | ICD-10-CM | POA: Diagnosis not present

## 2019-08-27 DIAGNOSIS — N139 Obstructive and reflux uropathy, unspecified: Secondary | ICD-10-CM | POA: Diagnosis not present

## 2019-08-27 LAB — URINALYSIS, COMPLETE (UACMP) WITH MICROSCOPIC
Bilirubin Urine: NEGATIVE
Glucose, UA: NEGATIVE mg/dL
Ketones, ur: NEGATIVE mg/dL
Nitrite: NEGATIVE
Protein, ur: 100 mg/dL — AB
Specific Gravity, Urine: 1.02 (ref 1.005–1.030)
pH: 5.5 (ref 5.0–8.0)

## 2019-08-27 NOTE — Patient Instructions (Signed)
Laser Therapy for Kidney Stones Laser therapy for kidney stones is a procedure to break up small, hard mineral deposits that form in the kidney (kidney stones). The procedure is done using a device that produces a focused beam of light (laser). The laser breaks up kidney stones into pieces that are small enough to be passed out of the body through urination or removed from the body during the procedure. You may need laser therapy if you have kidney stones that are painful or block your urinary tract. This procedure is done by inserting a tube (ureteroscope) into your kidney through the urethral opening. The urethra is the part of the body that drains urine from the bladder. In women, the urethra opens above the vaginal opening. In men, the urethra opens at the tip of the penis. The ureteroscope is inserted through the urethra, and surgical instruments are moved through the bladder and the muscular tube that connects the kidney to the bladder (ureter) until they reach the kidney. Tell a health care provider about:  Any allergies you have.  All medicines you are taking, including vitamins, herbs, eye drops, creams, and over-the-counter medicines.  Any problems you or family members have had with anesthetic medicines.  Any blood disorders you have.  Any surgeries you have had.  Any medical conditions you have.  Whether you are pregnant or may be pregnant. What are the risks? Generally, this is a safe procedure. However, problems may occur, including:  Infection.  Bleeding.  Allergic reactions to medicines.  Damage to the urethra, bladder, or ureter.  Urinary tract infection (UTI).  Narrowing of the urethra (urethral stricture).  Difficulty passing urine.  Blockage of the kidney caused by a fragment of kidney stone. What happens before the procedure? Medicines  Ask your health care provider about: ? Changing or stopping your regular medicines. This is especially important if you  are taking diabetes medicines or blood thinners. ? Taking medicines such as aspirin and ibuprofen. These medicines can thin your blood. Do not take these medicines unless your health care provider tells you to take them. ? Taking over-the-counter medicines, vitamins, herbs, and supplements. Eating and drinking Follow instructions from your health care provider about eating and drinking, which may include:  8 hours before the procedure - stop eating heavy meals or foods, such as meat, fried foods, or fatty foods.  6 hours before the procedure - stop eating light meals or foods, such as toast or cereal.  6 hours before the procedure - stop drinking milk or drinks that contain milk.  2 hours before the procedure - stop drinking clear liquids. Staying hydrated Follow instructions from your health care provider about hydration, which may include:  Up to 2 hours before the procedure - you may continue to drink clear liquids, such as water, clear fruit juice, black coffee, and plain tea.  General instructions  You may have a physical exam before the procedure. You may also have tests, such as imaging tests and blood or urine tests.  If your ureter is too narrow, your health care provider may place a soft, flexible tube (stent) inside of it. The stent may be placed days or weeks before your laser therapy procedure.  Plan to have someone take you home from the hospital or clinic.  If you will be going home right after the procedure, plan to have someone stay with you for 24 hours.  Do not use any products that contain nicotine or tobacco for at least 4  weeks before the procedure. These products include cigarettes, e-cigarettes, and chewing tobacco. If you need help quitting, ask your health care provider. °· Ask your health care provider: °? How your surgical site will be marked or identified. °? What steps will be taken to help prevent infection. These may include: °§ Removing hair at the surgery  site. °§ Washing skin with a germ-killing soap. °§ Taking antibiotic medicine. °What happens during the procedure? ° °· An IV will be inserted into one of your veins. °· You will be given one or more of the following: °? A medicine to help you relax (sedative). °? A medicine to numb the area (local anesthetic). °? A medicine to make you fall asleep (general anesthetic). °· A ureteroscope will be inserted into your urethra. The ureteroscope will send images to a video screen in the operating room to guide your surgeon to the area of your kidney that will be treated. °· A small, flexible tube will be threaded through the ureteroscope and into your bladder and ureter, up to your kidney. °· The laser device will be inserted into your kidney through the tube. Your surgeon will pulse the laser on and off to break up kidney stones. °· A surgical instrument that has a tiny wire basket may be inserted through the tube into your kidney to remove the pieces of broken kidney stone. °The procedure may vary among health care providers and hospitals. °What happens after the procedure? °· Your blood pressure, heart rate, breathing rate, and blood oxygen level will be monitored until you leave the hospital or clinic. °· You will be given pain medicine as needed. °· You may continue to receive antibiotics. °· You may have a stent temporarily placed in your ureter. °· Do not drive for 24 hours if you were given a sedative during your procedure. °· You may be given a strainer to collect any stone fragments that you pass in your urine. Your health care provider may have these tested. °Summary °· Laser therapy for kidney stones is a procedure to break up kidney stones into pieces that are small enough to be passed out of the body through urination or removed during the procedure. °· Follow instructions from your health care provider about eating and drinking before the procedure. °· During the procedure, the ureteroscope will send images  to a video screen to guide your surgeon to the area of your kidney that will be treated. °· Do not drive for 24 hours if you were given a sedative during your procedure. °This information is not intended to replace advice given to you by your health care provider. Make sure you discuss any questions you have with your health care provider. °Document Revised: 10/12/2017 Document Reviewed: 10/12/2017 °Elsevier Patient Education © 2020 [employer] ° °

## 2019-08-27 NOTE — H&P (View-Only) (Signed)
08/27/2019 11:37 AM   Shane Grant 08-25-1931 [id number]  Reason for visit: Follow up nephrolithiasis, right renal mass, incontinence  HPI: I saw Shane Grant in urology clinic for follow-up of the above issues after recent hospitalization.  He is a frail 86 year old male who is a resident at [facility] who presented to the ED on 08/14/2019 with worsening weakness as well as urinary and fecal incontinence.  He was found to have a 6 mm proximal left ureteral stone as well as a partial left staghorn stone with AKI and questionable urinalysis, and underwent cystoscopy and left ureteral stent placement.  He also was noted to have large stone in the prostatic fossa that was dusted and removed at the time of surgery.  Urine culture ultimately grew 7k colonies Enterococcus.  Incidentally found on noncontrast CT was a 3 cm central right-sided renal mass, and further evaluation with contrast was recommended.  He has CKD at baseline.  He reports he is overall been doing well since surgery.  He still having some weakness in his legs, but denies any dizziness, fevers, chills, abdominal pain, dysuria, or gross hematuria.  He feels his incontinence has improved slightly since before surgery.  We discussed various treatment options for urolithiasis including observation with or without medical expulsive therapy, shockwave lithotripsy (SWL), ureteroscopy and laser lithotripsy with stent placement, and percutaneous nephrolithotomy.  We also discussed management of just his ureteral stone, versus his 3 cm partial staghorn stone as well.  We discussed that management is based on stone size, location, density, patient co-morbidities, and patient preference.   SWL has a lower stone free rate in a single procedure, but also a lower complication rate compared to ureteroscopy and avoids a stent and associated stent related symptoms. Possible complications include renal hematoma, steinstrasse, and need for additional  treatment.  Ureteroscopy with laser lithotripsy and stent placement has a higher stone free rate than SWL in a single procedure, however increased complication rate including possible infection, ureteral injury, bleeding, and stent related morbidity. Common stent related symptoms include dysuria, urgency/frequency, and flank pain.  PCNL is the favored treatment for stones >2cm. It involves a small incision in the flank, with complete fragmentation of stones and removal. It has the highest stone free rate, but also the highest complication rate. Possible complications include bleeding, infection/sepsis, injury to surrounding organs including the pleura, and collecting system injury.   After an extensive discussion of the risks and benefits of the above treatment options, the patient would like to proceed with left ureteroscopy, laser lithotripsy, and stent exchange of just his 6 mm left proximal ureteral stone.  We discussed the risks and benefits of length of PCNL, staged ureteroscopy, or observation for his partial staghorn stone.  With his numerous comorbidities he would like to pursue observation alone for the staghorn stone.  We discussed that we could always reconsider treatment of this in the future with either staged ureteroscopy or even PCNL if he develops recurrent UTIs or obstruction.  Will need close surveillance to monitor for any significant enlargement of the stone.  Finally, we also discussed the 3 cm right renal mass and need for further evaluation with either contrasted imaging or renal ultrasound.  I recommended monitoring at this time with a repeat renal ultrasound in approximately 2 months, which we typically would order anyway after undergoing ureteroscopy to evaluate for silent hydronephrosis.  -Schedule left URS/LL of left proximal ureteral stone in 1-2 weeks, after extensive discussion he opts for observation of  his left partial staghorn stone at this time -Renal ultrasound in 2  months for better evaluation of 3cm right renal mass -Urinalysis and culture sent today  I spent 45 total minutes on the day of the encounter including pre-visit review of the medical record, face-to-face time with the patient, and post visit ordering of labs/imaging/tests.  Billey Co, [facility] [address] [place], [place] [address] [phone]

## 2019-08-27 NOTE — Progress Notes (Signed)
08/27/2019 11:37 AM   Shane Grant 09/08/1931 [id number]  Reason for visit: Follow up nephrolithiasis, right renal mass, incontinence  HPI: I saw Mr. Chura in urology clinic for follow-up of the above issues after recent hospitalization.  He is a frail 86 year old male who is a resident at [facility] who presented to the ED on 08/14/2019 with worsening weakness as well as urinary and fecal incontinence.  He was found to have a 6 mm proximal left ureteral stone as well as a partial left staghorn stone with AKI and questionable urinalysis, and underwent cystoscopy and left ureteral stent placement.  He also was noted to have large stone in the prostatic fossa that was dusted and removed at the time of surgery.  Urine culture ultimately grew 7k colonies Enterococcus.  Incidentally found on noncontrast CT was a 3 cm central right-sided renal mass, and further evaluation with contrast was recommended.  He has CKD at baseline.  He reports he is overall been doing well since surgery.  He still having some weakness in his legs, but denies any dizziness, fevers, chills, abdominal pain, dysuria, or gross hematuria.  He feels his incontinence has improved slightly since before surgery.  We discussed various treatment options for urolithiasis including observation with or without medical expulsive therapy, shockwave lithotripsy (SWL), ureteroscopy and laser lithotripsy with stent placement, and percutaneous nephrolithotomy.  We also discussed management of just his ureteral stone, versus his 3 cm partial staghorn stone as well.  We discussed that management is based on stone size, location, density, patient co-morbidities, and patient preference.   SWL has a lower stone free rate in a single procedure, but also a lower complication rate compared to ureteroscopy and avoids a stent and associated stent related symptoms. Possible complications include renal hematoma, steinstrasse, and need for additional  treatment.  Ureteroscopy with laser lithotripsy and stent placement has a higher stone free rate than SWL in a single procedure, however increased complication rate including possible infection, ureteral injury, bleeding, and stent related morbidity. Common stent related symptoms include dysuria, urgency/frequency, and flank pain.  PCNL is the favored treatment for stones >2cm. It involves a small incision in the flank, with complete fragmentation of stones and removal. It has the highest stone free rate, but also the highest complication rate. Possible complications include bleeding, infection/sepsis, injury to surrounding organs including the pleura, and collecting system injury.   After an extensive discussion of the risks and benefits of the above treatment options, the patient would like to proceed with left ureteroscopy, laser lithotripsy, and stent exchange of just his 6 mm left proximal ureteral stone.  We discussed the risks and benefits of length of PCNL, staged ureteroscopy, or observation for his partial staghorn stone.  With his numerous comorbidities he would like to pursue observation alone for the staghorn stone.  We discussed that we could always reconsider treatment of this in the future with either staged ureteroscopy or even PCNL if he develops recurrent UTIs or obstruction.  Will need close surveillance to monitor for any significant enlargement of the stone.  Finally, we also discussed the 3 cm right renal mass and need for further evaluation with either contrasted imaging or renal ultrasound.  I recommended monitoring at this time with a repeat renal ultrasound in approximately 2 months, which we typically would order anyway after undergoing ureteroscopy to evaluate for silent hydronephrosis.  -Schedule left URS/LL of left proximal ureteral stone in 1-2 weeks, after extensive discussion he opts for observation of  his left partial staghorn stone at this time -Renal ultrasound in 2  months for better evaluation of 3cm right renal mass -Urinalysis and culture sent today  I spent 45 total minutes on the day of the encounter including pre-visit review of the medical record, face-to-face time with the patient, and post visit ordering of labs/imaging/tests.  Billey Co, [facility] [address] [place], [place] [address] [phone]

## 2019-08-28 LAB — URINE CULTURE: Culture: <10000 — AB

## 2019-08-30 ENCOUNTER — Ambulatory Visit: Payer: Medicare Other | Admitting: Internal Medicine

## 2019-09-05 ENCOUNTER — Other Ambulatory Visit: Payer: Self-pay | Admitting: Urology

## 2019-09-05 ENCOUNTER — Other Ambulatory Visit: Admission: RE | Admit: 2019-09-05 | Payer: Medicare Other | Source: Ambulatory Visit

## 2019-09-05 DIAGNOSIS — N200 Calculus of kidney: Secondary | ICD-10-CM

## 2019-09-09 ENCOUNTER — Encounter: Payer: Self-pay | Admitting: Urology

## 2019-09-10 DIAGNOSIS — F05 Delirium due to known physiological condition: Secondary | ICD-10-CM | POA: Diagnosis not present

## 2019-09-12 ENCOUNTER — Encounter
Admission: RE | Admit: 2019-09-12 | Discharge: 2019-09-12 | Disposition: A | Discharge status: Home / Self Care | Payer: Medicare Other | Source: Ambulatory Visit | Attending: Urology | Admitting: Urology

## 2019-09-12 NOTE — Patient Instructions (Addendum)
Your procedure is scheduled on: 09-20-19 FRIDAY Report to Same Day Surgery 2nd floor medical mall [facility] Entrance-take elevator on left to 2nd floor.  Check in with surgery information desk.) To find out your arrival time please call [phone] between 1PM - 3PM on 09-19-19 THURSDAY  Remember: Instructions that are not followed completely may result in serious medical risk, up to and including death, or upon the discretion of your surgeon and anesthesiologist your surgery may need to be rescheduled.    _x___ 1. Do not eat food after midnight the night before your procedure. NO GUM OR CANDY AFTER MIDNIGHT. You may drink WATER up to 2 hours before you are scheduled to arrive at the hospital for your procedure.  Do not drink WATER within 2 hours of your scheduled arrival to the hospital.  Type 1 and type 2 diabetics should only drink water.     __x__ 2. No Alcohol for 24 hours before or after surgery.   __x__3. No Smoking or e-cigarettes for 24 prior to surgery.  Do not use any chewable tobacco products for at least 6 hour prior to surgery   ____  4. Bring all medications with you on the day of surgery if instructed.    __x__ 5. Notify your doctor if there is any change in your medical condition     (cold, fever, infections).    x___6. On the morning of surgery brush your teeth with toothpaste and water.  You may rinse your mouth with mouth wash if you wish.  Do not swallow any toothpaste or mouthwash.   Do not wear jewelry, make-up, hairpins, clips or nail polish.  Do not wear lotions, powders, or perfumes. You may wear deodorant.  Do not shave 48 hours prior to surgery. Men may shave face and neck.  Do not bring valuables to the hospital.    [facility] is not responsible for any belongings or valuables.               Contacts, dentures or bridgework may not be worn into surgery.  Leave your suitcase in the car. After surgery it may be brought to your room.  For patients  admitted to the hospital, discharge time is determined by your  treatment team.  _  Patients discharged the day of surgery will not be allowed to drive home.  You will need someone to drive you home and stay with you the night of your procedure.    Please read over the following fact sheets that you were given:   [facility] Preparing for Surgery  ____ TAKE THE FOLLOWING MEDICATION THE MORNING OF SURGERY WITH A SMALL SIP OF WATER. These include:  1. NONE  2.  3.  4.  5.  6.  ____Fleets enema or Magnesium Citrate as directed.   ____ Use CHG Soap or sage wipes as directed on instruction sheet   ____ Use inhalers on the day of surgery and bring to hospital day of surgery  _X___ Stop Metformin 2 days prior to surgery-LAST DOSE ON 09-17-19 TUESDAY    _X___ Take 1/2 of usual insulin dose the night before surgery and none on the morning surgery-DO NOT GIVE PT HIS LANTUS THE MORNING OF HIS SURGERY  _x___ Follow recommendations from Cardiologist, Pulmonologist or PCP regarding stopping Aspirin, Coumadin, Plavix ,Eliquis, Effient, or Pradaxa, and Pletal-CONTINUE YOUR 81 MG ASPIRIN PER DR SNINSKY-DO NOT TAKE THE MORNING OF SURGERY  X____Stop Anti-inflammatories such as Advil, Aleve, Ibuprofen, Motrin, Naproxen,  Naprosyn, Goodies powders or aspirin products NOW-OK to take Tylenol    ____ Stop supplements until after surgery.    ____ Bring C-Pap to the hospital.

## 2019-09-13 ENCOUNTER — Ambulatory Visit: Payer: Medicare Other | Admitting: Internal Medicine

## 2019-09-15 DIAGNOSIS — E119 Type 2 diabetes mellitus without complications: Secondary | ICD-10-CM | POA: Diagnosis not present

## 2019-09-15 DIAGNOSIS — I480 Paroxysmal atrial fibrillation: Secondary | ICD-10-CM | POA: Diagnosis not present

## 2019-09-15 DIAGNOSIS — I129 Hypertensive chronic kidney disease with stage 1 through stage 4 chronic kidney disease, or unspecified chronic kidney disease: Secondary | ICD-10-CM | POA: Diagnosis not present

## 2019-09-15 DIAGNOSIS — E872 Acidosis: Secondary | ICD-10-CM | POA: Diagnosis not present

## 2019-09-15 DIAGNOSIS — R296 Repeated falls: Secondary | ICD-10-CM | POA: Diagnosis not present

## 2019-09-15 DIAGNOSIS — Z20822 Contact with and (suspected) exposure to covid-19: Secondary | ICD-10-CM | POA: Diagnosis present

## 2019-09-15 DIAGNOSIS — Z741 Need for assistance with personal care: Secondary | ICD-10-CM | POA: Diagnosis not present

## 2019-09-15 DIAGNOSIS — N401 Enlarged prostate with lower urinary tract symptoms: Secondary | ICD-10-CM | POA: Diagnosis not present

## 2019-09-15 DIAGNOSIS — R6521 Severe sepsis with septic shock: Secondary | ICD-10-CM | POA: Diagnosis present

## 2019-09-15 DIAGNOSIS — Z801 Family history of malignant neoplasm of trachea, bronchus and lung: Secondary | ICD-10-CM | POA: Diagnosis not present

## 2019-09-15 DIAGNOSIS — A419 Sepsis, unspecified organism: Secondary | ICD-10-CM | POA: Diagnosis present

## 2019-09-15 DIAGNOSIS — N200 Calculus of kidney: Secondary | ICD-10-CM | POA: Diagnosis not present

## 2019-09-15 DIAGNOSIS — I350 Nonrheumatic aortic (valve) stenosis: Secondary | ICD-10-CM | POA: Diagnosis not present

## 2019-09-15 DIAGNOSIS — Z9842 Cataract extraction status, left eye: Secondary | ICD-10-CM | POA: Diagnosis not present

## 2019-09-15 DIAGNOSIS — Z9049 Acquired absence of other specified parts of digestive tract: Secondary | ICD-10-CM | POA: Diagnosis not present

## 2019-09-15 DIAGNOSIS — E162 Hypoglycemia, unspecified: Secondary | ICD-10-CM | POA: Diagnosis not present

## 2019-09-15 DIAGNOSIS — R0602 Shortness of breath: Secondary | ICD-10-CM | POA: Diagnosis not present

## 2019-09-15 DIAGNOSIS — N390 Urinary tract infection, site not specified: Secondary | ICD-10-CM | POA: Diagnosis present

## 2019-09-15 DIAGNOSIS — Z48816 Encounter for surgical aftercare following surgery on the genitourinary system: Secondary | ICD-10-CM | POA: Diagnosis not present

## 2019-09-15 DIAGNOSIS — R05 Cough: Secondary | ICD-10-CM | POA: Diagnosis not present

## 2019-09-15 DIAGNOSIS — K5641 Fecal impaction: Secondary | ICD-10-CM | POA: Diagnosis not present

## 2019-09-15 DIAGNOSIS — Z66 Do not resuscitate: Secondary | ICD-10-CM | POA: Diagnosis present

## 2019-09-15 DIAGNOSIS — Z01812 Encounter for preprocedural laboratory examination: Secondary | ICD-10-CM | POA: Diagnosis present

## 2019-09-15 DIAGNOSIS — I9589 Other hypotension: Secondary | ICD-10-CM | POA: Diagnosis not present

## 2019-09-15 DIAGNOSIS — G9341 Metabolic encephalopathy: Secondary | ICD-10-CM | POA: Diagnosis present

## 2019-09-15 DIAGNOSIS — I712 Thoracic aortic aneurysm, without rupture: Secondary | ICD-10-CM | POA: Diagnosis not present

## 2019-09-15 DIAGNOSIS — R0902 Hypoxemia: Secondary | ICD-10-CM | POA: Diagnosis not present

## 2019-09-15 DIAGNOSIS — Z961 Presence of intraocular lens: Secondary | ICD-10-CM | POA: Diagnosis present

## 2019-09-15 DIAGNOSIS — E1140 Type 2 diabetes mellitus with diabetic neuropathy, unspecified: Secondary | ICD-10-CM | POA: Diagnosis not present

## 2019-09-15 DIAGNOSIS — Z79899 Other long term (current) drug therapy: Secondary | ICD-10-CM | POA: Diagnosis not present

## 2019-09-15 DIAGNOSIS — Z87891 Personal history of nicotine dependence: Secondary | ICD-10-CM | POA: Diagnosis not present

## 2019-09-15 DIAGNOSIS — Z515 Encounter for palliative care: Secondary | ICD-10-CM | POA: Diagnosis not present

## 2019-09-15 DIAGNOSIS — K810 Acute cholecystitis: Secondary | ICD-10-CM | POA: Diagnosis present

## 2019-09-15 DIAGNOSIS — E785 Hyperlipidemia, unspecified: Secondary | ICD-10-CM | POA: Diagnosis present

## 2019-09-15 DIAGNOSIS — N201 Calculus of ureter: Secondary | ICD-10-CM | POA: Diagnosis not present

## 2019-09-15 DIAGNOSIS — Z87442 Personal history of urinary calculi: Secondary | ICD-10-CM | POA: Diagnosis not present

## 2019-09-15 DIAGNOSIS — N189 Chronic kidney disease, unspecified: Secondary | ICD-10-CM | POA: Diagnosis not present

## 2019-09-15 DIAGNOSIS — R262 Difficulty in walking, not elsewhere classified: Secondary | ICD-10-CM | POA: Diagnosis not present

## 2019-09-15 DIAGNOSIS — R4189 Other symptoms and signs involving cognitive functions and awareness: Secondary | ICD-10-CM | POA: Diagnosis not present

## 2019-09-15 DIAGNOSIS — R339 Retention of urine, unspecified: Secondary | ICD-10-CM | POA: Diagnosis not present

## 2019-09-15 DIAGNOSIS — N138 Other obstructive and reflux uropathy: Secondary | ICD-10-CM | POA: Diagnosis present

## 2019-09-15 DIAGNOSIS — I719 Aortic aneurysm of unspecified site, without rupture: Secondary | ICD-10-CM | POA: Diagnosis not present

## 2019-09-15 DIAGNOSIS — M6281 Muscle weakness (generalized): Secondary | ICD-10-CM | POA: Diagnosis not present

## 2019-09-15 DIAGNOSIS — N179 Acute kidney failure, unspecified: Secondary | ICD-10-CM | POA: Diagnosis present

## 2019-09-15 DIAGNOSIS — J181 Lobar pneumonia, unspecified organism: Secondary | ICD-10-CM | POA: Diagnosis not present

## 2019-09-15 DIAGNOSIS — I4891 Unspecified atrial fibrillation: Secondary | ICD-10-CM | POA: Diagnosis not present

## 2019-09-15 DIAGNOSIS — N202 Calculus of kidney with calculus of ureter: Secondary | ICD-10-CM | POA: Diagnosis present

## 2019-09-15 DIAGNOSIS — E861 Hypovolemia: Secondary | ICD-10-CM | POA: Diagnosis not present

## 2019-09-15 DIAGNOSIS — Z7189 Other specified counseling: Secondary | ICD-10-CM | POA: Diagnosis not present

## 2019-09-15 DIAGNOSIS — R531 Weakness: Secondary | ICD-10-CM | POA: Diagnosis not present

## 2019-09-15 DIAGNOSIS — I10 Essential (primary) hypertension: Secondary | ICD-10-CM | POA: Diagnosis present

## 2019-09-15 DIAGNOSIS — N139 Obstructive and reflux uropathy, unspecified: Secondary | ICD-10-CM | POA: Diagnosis not present

## 2019-09-15 DIAGNOSIS — J690 Pneumonitis due to inhalation of food and vomit: Secondary | ICD-10-CM | POA: Diagnosis present

## 2019-09-15 DIAGNOSIS — Z9841 Cataract extraction status, right eye: Secondary | ICD-10-CM | POA: Diagnosis not present

## 2019-09-15 DIAGNOSIS — E1022 Type 1 diabetes mellitus with diabetic chronic kidney disease: Secondary | ICD-10-CM | POA: Diagnosis not present

## 2019-09-15 DIAGNOSIS — B351 Tinea unguium: Secondary | ICD-10-CM | POA: Diagnosis not present

## 2019-09-15 DIAGNOSIS — Z888 Allergy status to other drugs, medicaments and biological substances status: Secondary | ICD-10-CM | POA: Diagnosis not present

## 2019-09-15 DIAGNOSIS — E161 Other hypoglycemia: Secondary | ICD-10-CM | POA: Diagnosis not present

## 2019-09-15 DIAGNOSIS — N2889 Other specified disorders of kidney and ureter: Secondary | ICD-10-CM | POA: Diagnosis not present

## 2019-09-15 DIAGNOSIS — Z85038 Personal history of other malignant neoplasm of large intestine: Secondary | ICD-10-CM | POA: Diagnosis not present

## 2019-09-15 DIAGNOSIS — I77810 Thoracic aortic ectasia: Secondary | ICD-10-CM | POA: Diagnosis not present

## 2019-09-15 DIAGNOSIS — N1330 Unspecified hydronephrosis: Secondary | ICD-10-CM | POA: Diagnosis not present

## 2019-09-15 DIAGNOSIS — R278 Other lack of coordination: Secondary | ICD-10-CM | POA: Diagnosis not present

## 2019-09-15 DIAGNOSIS — E11649 Type 2 diabetes mellitus with hypoglycemia without coma: Secondary | ICD-10-CM | POA: Diagnosis present

## 2019-09-15 DIAGNOSIS — N4289 Other specified disorders of prostate: Secondary | ICD-10-CM | POA: Diagnosis not present

## 2019-09-15 DIAGNOSIS — R6520 Severe sepsis without septic shock: Secondary | ICD-10-CM | POA: Diagnosis not present

## 2019-09-15 DIAGNOSIS — Z7984 Long term (current) use of oral hypoglycemic drugs: Secondary | ICD-10-CM | POA: Diagnosis not present

## 2019-09-15 DIAGNOSIS — E1040 Type 1 diabetes mellitus with diabetic neuropathy, unspecified: Secondary | ICD-10-CM | POA: Diagnosis not present

## 2019-09-15 DIAGNOSIS — J9801 Acute bronchospasm: Secondary | ICD-10-CM | POA: Diagnosis not present

## 2019-09-18 ENCOUNTER — Other Ambulatory Visit: Payer: Self-pay

## 2019-09-18 ENCOUNTER — Other Ambulatory Visit
Admission: RE | Admit: 2019-09-18 | Discharge: 2019-09-18 | Disposition: A | Discharge status: Home / Self Care | Payer: Medicare Other | Source: Ambulatory Visit | Attending: Surgery | Admitting: Surgery

## 2019-09-18 DIAGNOSIS — Z01812 Encounter for preprocedural laboratory examination: Secondary | ICD-10-CM | POA: Insufficient documentation

## 2019-09-18 DIAGNOSIS — Z20822 Contact with and (suspected) exposure to covid-19: Secondary | ICD-10-CM | POA: Insufficient documentation

## 2019-09-18 LAB — SARS CORONAVIRUS 2 (TAT 6-24 HRS): SARS Coronavirus 2: NEGATIVE

## 2019-09-19 MED ORDER — SODIUM CHLORIDE 0.9 % IV SOLN
1.0000 g | INTRAVENOUS | Status: AC
  Administered 2019-09-20: 1 g via INTRAVENOUS
  Filled 2019-09-19: qty 1

## 2019-09-20 ENCOUNTER — Encounter: Payer: Self-pay | Admitting: Urology

## 2019-09-20 ENCOUNTER — Encounter
Admission: RE | Disposition: A | Discharge status: Home / Self Care | Payer: Self-pay | Source: Home / Self Care | Attending: Urology

## 2019-09-20 ENCOUNTER — Ambulatory Visit: Payer: Medicare Other | Admitting: Certified Registered Nurse Anesthetist

## 2019-09-20 ENCOUNTER — Other Ambulatory Visit: Payer: Self-pay

## 2019-09-20 ENCOUNTER — Ambulatory Visit: Payer: Medicare Other

## 2019-09-20 ENCOUNTER — Ambulatory Visit
Admission: RE | Admit: 2019-09-20 | Discharge: 2019-09-20 | Disposition: A | Discharge status: Home / Self Care | Payer: Medicare Other | Attending: Urology | Admitting: Urology

## 2019-09-20 DIAGNOSIS — I4891 Unspecified atrial fibrillation: Secondary | ICD-10-CM

## 2019-09-20 DIAGNOSIS — N2889 Other specified disorders of kidney and ureter: Secondary | ICD-10-CM | POA: Insufficient documentation

## 2019-09-20 DIAGNOSIS — I10 Essential (primary) hypertension: Secondary | ICD-10-CM | POA: Diagnosis not present

## 2019-09-20 DIAGNOSIS — I129 Hypertensive chronic kidney disease with stage 1 through stage 4 chronic kidney disease, or unspecified chronic kidney disease: Secondary | ICD-10-CM | POA: Diagnosis not present

## 2019-09-20 DIAGNOSIS — E785 Hyperlipidemia, unspecified: Secondary | ICD-10-CM | POA: Diagnosis not present

## 2019-09-20 DIAGNOSIS — E1040 Type 1 diabetes mellitus with diabetic neuropathy, unspecified: Secondary | ICD-10-CM | POA: Diagnosis not present

## 2019-09-20 DIAGNOSIS — N4289 Other specified disorders of prostate: Secondary | ICD-10-CM | POA: Diagnosis not present

## 2019-09-20 DIAGNOSIS — E1022 Type 1 diabetes mellitus with diabetic chronic kidney disease: Secondary | ICD-10-CM | POA: Diagnosis not present

## 2019-09-20 DIAGNOSIS — N202 Calculus of kidney with calculus of ureter: Secondary | ICD-10-CM | POA: Insufficient documentation

## 2019-09-20 DIAGNOSIS — N200 Calculus of kidney: Secondary | ICD-10-CM | POA: Diagnosis not present

## 2019-09-20 DIAGNOSIS — N201 Calculus of ureter: Secondary | ICD-10-CM | POA: Diagnosis not present

## 2019-09-20 DIAGNOSIS — N189 Chronic kidney disease, unspecified: Secondary | ICD-10-CM | POA: Diagnosis not present

## 2019-09-20 HISTORY — PX: CYSTOSCOPY/URETEROSCOPY/HOLMIUM LASER/STENT PLACEMENT: SHX6546

## 2019-09-20 LAB — GLUCOSE, CAPILLARY
Glucose-Capillary: 160 mg/dL — ABNORMAL HIGH (ref 70–99)
Glucose-Capillary: 157 mg/dL — ABNORMAL HIGH (ref 70–99)

## 2019-09-20 SURGERY — CYSTOSCOPY/URETEROSCOPY/HOLMIUM LASER/STENT PLACEMENT
Anesthesia: General | Laterality: Left

## 2019-09-20 MED ORDER — SODIUM CHLORIDE 0.9 % IV SOLN
INTRAVENOUS | Status: DC | PRN
  Administered 2019-09-20: 75 ug/min via INTRAVENOUS

## 2019-09-20 MED ORDER — CHLORHEXIDINE GLUCONATE 0.12 % MT SOLN: 15.0000 mL | Freq: Once | OROMUCOSAL | Status: DC

## 2019-09-20 MED ORDER — FENTANYL CITRATE (PF) 100 MCG/2ML IJ SOLN
25.0000 ug | INTRAMUSCULAR | Status: DC | PRN
  Administered 2019-09-20 (×2): 25 ug via INTRAVENOUS

## 2019-09-20 MED ORDER — ONDANSETRON HCL 4 MG/2ML IJ SOLN
INTRAMUSCULAR | Status: DC | PRN
  Administered 2019-09-20: 4 mg via INTRAVENOUS

## 2019-09-20 MED ORDER — FENTANYL CITRATE (PF) 100 MCG/2ML IJ SOLN
INTRAMUSCULAR | Status: AC
  Filled 2019-09-20: qty 2

## 2019-09-20 MED ORDER — FENTANYL CITRATE (PF) 100 MCG/2ML IJ SOLN
INTRAMUSCULAR | Status: DC | PRN
  Administered 2019-09-20 (×2): 25 ug via INTRAVENOUS

## 2019-09-20 MED ORDER — LACTATED RINGERS IV SOLN: INTRAVENOUS | Status: DC | PRN

## 2019-09-20 MED ORDER — IOHEXOL 180 MG/ML  SOLN
INTRAMUSCULAR | Status: DC | PRN
  Administered 2019-09-20: 20 mL

## 2019-09-20 MED ORDER — ONDANSETRON HCL 4 MG/2ML IJ SOLN: 4.0000 mg | Freq: Once | INTRAMUSCULAR | Status: DC | PRN

## 2019-09-20 MED ORDER — AMOXICILLIN 500 MG PO CAPS
500.0000 mg | ORAL_CAPSULE | Freq: Every day | ORAL | 0 refills | Status: DC
Start: 2019-09-20 — End: 2019-10-04

## 2019-09-20 MED ORDER — ORAL CARE MOUTH RINSE: 15.0000 mL | Freq: Once | OROMUCOSAL | Status: DC

## 2019-09-20 MED ORDER — DEXAMETHASONE SODIUM PHOSPHATE 10 MG/ML IJ SOLN
INTRAMUSCULAR | Status: DC | PRN
  Administered 2019-09-20: 5 mg via INTRAVENOUS

## 2019-09-20 MED ORDER — LIDOCAINE HCL (CARDIAC) PF 100 MG/5ML IV SOSY
PREFILLED_SYRINGE | INTRAVENOUS | Status: DC | PRN
  Administered 2019-09-20: 100 mg via INTRAVENOUS

## 2019-09-20 MED ORDER — PROPOFOL 10 MG/ML IV BOLUS
INTRAVENOUS | Status: AC
  Filled 2019-09-20: qty 40

## 2019-09-20 MED ORDER — SODIUM CHLORIDE 0.9 % IV SOLN: INTRAVENOUS | Status: DC

## 2019-09-20 MED ORDER — PROPOFOL 10 MG/ML IV BOLUS
INTRAVENOUS | Status: DC | PRN
  Administered 2019-09-20: 100 mg via INTRAVENOUS
  Administered 2019-09-20: 50 mg via INTRAVENOUS

## 2019-09-20 MED ORDER — PHENYLEPHRINE HCL (PRESSORS) 10 MG/ML IV SOLN
INTRAVENOUS | Status: DC | PRN
  Administered 2019-09-20 (×6): 100 ug via INTRAVENOUS

## 2019-09-20 SURGICAL SUPPLY — 33 items
BAG DRAIN CYSTO-URO LG1000N (MISCELLANEOUS) ×3 IMPLANT
BAG URO DRAIN 4000ML (MISCELLANEOUS) ×3 IMPLANT
BASKET ZERO TIP 1.9FR (BASKET) ×3 IMPLANT
BRUSH SCRUB EZ 1% IODOPHOR (MISCELLANEOUS) ×3 IMPLANT
CATH COUDE FOLEY 2W 5CC 18FR (CATHETERS) ×3 IMPLANT
CATH URETL 5X70 OPEN END (CATHETERS) IMPLANT
CNTNR SPEC 2.5X3XGRAD LEK (MISCELLANEOUS)
CONT SPEC 4OZ STER OR WHT (MISCELLANEOUS)
CONTAINER SPEC 2.5X3XGRAD LEK (MISCELLANEOUS) IMPLANT
DRAPE UTILITY 15X26 TOWEL STRL (DRAPES) ×3 IMPLANT
FIBER LASER TRAC TIP (UROLOGICAL SUPPLIES) ×3 IMPLANT
GLOVE BIOGEL PI IND STRL 7.5 (GLOVE) ×1 IMPLANT
GLOVE BIOGEL PI INDICATOR 7.5 (GLOVE) ×2
GOWN STRL REUS W/ TWL LRG LVL3 (GOWN DISPOSABLE) ×1 IMPLANT
GOWN STRL REUS W/ TWL XL LVL3 (GOWN DISPOSABLE) ×1 IMPLANT
GOWN STRL REUS W/TWL LRG LVL3 (GOWN DISPOSABLE) ×2
GOWN STRL REUS W/TWL XL LVL3 (GOWN DISPOSABLE) ×2
GUIDEWIRE STR DUAL SENSOR (WIRE) ×6 IMPLANT
INFUSOR MANOMETER BAG 3000ML (MISCELLANEOUS) ×3 IMPLANT
INTRODUCER DILATOR DOUBLE (INTRODUCER) IMPLANT
KIT TURNOVER CYSTO (KITS) ×3 IMPLANT
PACK CYSTO AR (MISCELLANEOUS) ×3 IMPLANT
SET CYSTO W/LG BORE CLAMP LF (SET/KITS/TRAYS/PACK) ×3 IMPLANT
SHEATH URETERAL 12FRX35CM (MISCELLANEOUS) IMPLANT
SOL .9 NS 3000ML IRR  AL (IV SOLUTION) ×2
SOL .9 NS 3000ML IRR UROMATIC (IV SOLUTION) ×1 IMPLANT
STENT URET 6FRX24 CONTOUR (STENTS) IMPLANT
STENT URET 6FRX26 CONTOUR (STENTS) IMPLANT
STENT URET 6FRX30 CONTOUR (STENTS) ×3 IMPLANT
SURGILUBE 2OZ TUBE FLIPTOP (MISCELLANEOUS) ×3 IMPLANT
SYR 10ML LL (SYRINGE) ×3 IMPLANT
VALVE UROSEAL ADJ ENDO (VALVE) ×3 IMPLANT
WATER STERILE IRR 1000ML POUR (IV SOLUTION) ×3 IMPLANT

## 2019-09-20 NOTE — Anesthesia Procedure Notes (Signed)
Procedure Name: LMA Insertion Date/Time: 09/20/2019 7:41 AM Performed by: Louann Sjogren, CRNA Pre-anesthesia Checklist: Patient identified, Emergency Drugs available, Suction available, Patient being monitored and Timeout performed Patient Re-evaluated:Patient Re-evaluated prior to induction Oxygen Delivery Method: Circle system utilized Preoxygenation: Pre-oxygenation with 100% oxygen Induction Type: IV induction Ventilation: Mask ventilation without difficulty LMA: LMA inserted LMA Size: 4.5 Tube type: Oral Number of attempts: 1 Placement Confirmation: positive ETCO2 and breath sounds checked- equal and bilateral Tube secured with: Tape Dental Injury: Teeth and Oropharynx as per pre-operative assessment

## 2019-09-20 NOTE — Interval H&P Note (Signed)
UROLOGY H&P UPDATE  Agree with prior H&P dated 08/27/19.  Frail 86 year old male who presented with a left ureteral stone, partial lower pole staghorn, and UTI and previously underwent stent placement.  Presents today for definitive management of his left ureteral stone, and possible management of partial lower pole stone pending intraoperative findings.  Cardiac: RRR Lungs: CTA bilaterally  Laterality: Left Procedure: Left ureteroscopy, laser lithotripsy, stent placement  Urine: 7/13 culture no growth  We specifically discussed the risks ureteroscopy including bleeding, infection/sepsis, stent related symptoms including flank pain/urgency/frequency/incontinence/dysuria, ureteral injury, inability to access stone, obstructive fragments, possible management of left staghorn stone pending intraoperative findings, or need for staged or additional procedures.   Billey Co, MD 09/20/2019&P

## 2019-09-20 NOTE — Op Note (Signed)
Date of procedure: 09/20/19  Preoperative diagnosis:  1. Left distal ureteral stone 2. Left renal stones  Postoperative diagnosis:  1. Same  Procedure: 1. Cystoscopy, left retrograde pyelogram with intraoperative interpretation, left ureteral stent placement 2. Left ureteroscopy and laser lithotripsy and basket extraction of 57mm distal ureteral stone 3. Left ureteroscopy and laser lithotripsy of 1 cm renal pelvis stone  Surgeon: Nickolas Madrid, MD  Anesthesia: General  Complications: None  Intraoperative findings:  1.  Abnormal cystic-appearing prostate, no residual bladder stones 2.  Left distal ureteral stone dusted and basketed free from ureter 3.  Uncomplicated dusting of left 1 cm renal stone 4.  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 patient that previously presented with AKI and possible UTI with a 5 mm left proximal ureteral stone and large renal stone burden.  After reviewing the management options for treatment, they elected to proceed with the above surgical procedure(s). We have discussed the potential benefits and risks of the procedure, side effects of the proposed treatment, the likelihood of the patient achieving the goals of the procedure, and any potential problems that might occur during the procedure or recuperation. Informed consent has been obtained.  Description of procedure:  The patient was taken to the operating room and general anesthesia was induced. SCDs were placed for DVT prophylaxis. The patient was placed in the dorsal lithotomy position, prepped and draped in the usual sterile fashion, and preoperative antibiotics(ampicillin) were administered. A preoperative time-out was performed.   A 21 French rigid cystoscope was used to intubate the urethra and this was followed  proximally towards the bladder.  The prostate was again small and cystic-appearing with no obstructive tissue.  There was no residual stone in the bladder.  The bladder was quite distended, and this was drained and irrigated free.  A sensor wire was passed alongside the left ureteral stent to the kidney under fluoroscopic vision.  The stent was then grasped and pulled to the meatus, and a second safety sensor wire was passed through the stent up into the kidney.  A single channel flexible ureteroscope advanced easily over the wire up into the kidney under fluoroscopic vision.  There was a large partial staghorn stone protruding from the left lower pole, as well as a 1 cm stone in the renal pelvis.  The 200 m laser fiber on settings of 0.5 J and 40 Hz was used to methodically dust the 1 cm renal pelvis stone to <1 mm fragments.  The stone dusted nicely.  With his age and comorbidities, I opted to leave the partial staghorn stone lodged in the lower pole in situ, as I felt there was more risk to trying to dust this with increased risk of obstructive fragments and infection.  Retrograde pyelogram showed no extravasation.  Careful pullback ureteroscopy demonstrated a 5 mm stone in the left distal ureter that likely represented migration of his original proximal ureteral stone.  I changed over to the semirigid ureteroscope alongside the wire, and the stone was fragmented to small pieces using the laser and a basket was used to remove all pieces from the ureter.  A 6 French by 30 cm ureteral stent was uneventfully placed in the left side with an excellent curl in the upper pole, as well as under direct vision the bladder.  An 34 French coud Foley catheter passed easily into the bladder with return of clear urine in 10 cc were placed in the balloon to maximize drainage.  Disposition: Stable to PACU  Plan: -Follow-up in clinic in 1 week for Foley removal, stent removal, and void trial -We will need renal  ultrasound in 2 months for active surveillance of known 3 cm right renal mass and evaluate for silent hydronephrosis  Nickolas Madrid, MD

## 2019-09-20 NOTE — Progress Notes (Signed)
Pt in afib, old EKGs showing NSR, Dr Andree Elk aware and ordered EKG, Cardiology paged.

## 2019-09-20 NOTE — Transfer of Care (Signed)
Immediate Anesthesia Transfer of Care Note  Patient: Shane Grant  Procedure(s) Performed: CYSTOSCOPY/URETEROSCOPY/HOLMIUM LASER/STENT PLACEMENT (Left )  Patient Location: PACU  Anesthesia Type:General  Level of Consciousness: awake  Airway & Oxygen Therapy: Patient Spontanous Breathing  Post-op Assessment: Report given to RN and Post -op Vital signs reviewed and stable  Post vital signs: Reviewed and stable  Last Vitals:  Vitals Value Taken Time  BP 100/57 09/20/19 0845  Temp 36.7 C 09/20/19 0843  Pulse    Resp 16 09/20/19 0846  SpO2    Vitals shown include unvalidated device data.  Last Pain:  Vitals:   09/20/19 0613  TempSrc: Temporal  PainSc: 0-No pain         Complications: No complications documented.

## 2019-09-20 NOTE — Anesthesia Preprocedure Evaluation (Signed)
Anesthesia Evaluation  Patient identified by MRN, date of birth, ID band Patient awake    Reviewed: Allergy & Precautions, H&P , NPO status , Patient's Chart, lab work & pertinent test results, reviewed documented beta blocker date and time   Airway Mallampati: II  TM Distance: >3 FB Neck ROM: full    Dental  (+) Teeth Intact   Pulmonary neg pulmonary ROS, former smoker,    Pulmonary exam normal        Cardiovascular Exercise Tolerance: Good hypertension, negative cardio ROS Normal cardiovascular exam Rate:Normal     Neuro/Psych negative neurological ROS  negative psych ROS   GI/Hepatic negative GI ROS, Neg liver ROS,   Endo/Other  negative endocrine ROSdiabetes, Well Controlled, Type 1, Insulin Dependent, Oral Hypoglycemic Agents  Renal/GU negative Renal ROS  negative genitourinary   Musculoskeletal   Abdominal   Peds  Hematology negative hematology ROS (+)   Anesthesia Other Findings   Reproductive/Obstetrics negative OB ROS                             Anesthesia Physical Anesthesia Plan  ASA: III  Anesthesia Plan: General LMA   Post-op Pain Management:    Induction:   PONV Risk Score and Plan:   Airway Management Planned:   Additional Equipment:   Intra-op Plan:   Post-operative Plan:   Informed Consent: I have reviewed the patients History and Physical, chart, labs and discussed the procedure including the risks, benefits and alternatives for the proposed anesthesia with the patient or authorized representative who has indicated his/her understanding and acceptance.       Plan Discussed with: CRNA  Anesthesia Plan Comments:         Anesthesia Quick Evaluation

## 2019-09-20 NOTE — OR Nursing (Signed)
Dr Andree Elk aware that we are unable to reconcile medications do to [facility] phone lines being down this am. No new orders at time.

## 2019-09-20 NOTE — Progress Notes (Signed)
Okay to send pt with BP 92/65 per Dr. Andree Elk Anesthesiologist.

## 2019-09-20 NOTE — Discharge Instructions (Addendum)
Indwelling Urinary Catheter Care, Adult An indwelling urinary catheter is a thin tube that is put into your bladder. The tube helps to drain pee (urine) out of your body. The tube goes in through your urethra. Your urethra is where pee comes out of your body. Your pee will come out through the catheter, then it will go into a bag (drainage bag). Take good care of your catheter so it will work well. How to wear your catheter and bag Supplies needed  Sticky tape (adhesive tape) or a leg strap.  Alcohol wipe or soap and water (if you use tape).  A clean towel (if you use tape).  Large overnight bag.  Smaller bag (leg bag). Wearing your catheter Attach your catheter to your leg with tape or a leg strap.  Make sure the catheter is not pulled tight.  If a leg strap gets wet, take it off and put on a dry strap.  If you use tape to hold the bag on your leg: 1. Use an alcohol wipe or soap and water to wash your skin where the tape made it sticky before. 2. Use a clean towel to pat-dry that skin. 3. Use new tape to make the bag stay on your leg. Wearing your bags You should have been given a large overnight bag.  You may wear the overnight bag in the day or night.  Always have the overnight bag lower than your bladder.  Do not let the bag touch the floor.  Before you go to sleep, put a clean plastic bag in a wastebasket. Then hang the overnight bag inside the wastebasket. You should also have a smaller leg bag that fits under your clothes.  Always wear the leg bag below your knee.  Do not wear your leg bag at night. How to care for your skin and catheter Supplies needed  A clean washcloth.  Water and mild soap.  A clean towel. Caring for your skin and catheter      Clean the skin around your catheter every day: 1. Wash your hands with soap and water. 2. Wet a clean washcloth in warm water and mild soap. 3. Clean the skin around your urethra.  If you are  male:  Gently spread the folds of skin around your vagina (labia).  With the washcloth in your other hand, wipe the inner side of your labia on each side. Wipe from front to back.  If you are male:  Pull back any skin that covers the end of your penis (foreskin).  With the washcloth in your other hand, wipe your penis in small circles. Start wiping at the tip of your penis, then move away from the catheter.  Move the foreskin back in place, if needed. 4. With your free hand, hold the catheter close to where it goes into your body.  Keep holding the catheter during cleaning so it does not get pulled out. 5. With the washcloth in your other hand, clean the catheter.  Only wipe downward on the catheter.  Do not wipe upward toward your body. Doing this may push germs into your urethra and cause infection. 6. Use a clean towel to pat-dry the catheter and the skin around it. Make sure to wipe off all soap. 7. Wash your hands with soap and water.  Shower every day. Do not take baths.  Do not use cream, ointment, or lotion on the area where the catheter goes into your body, unless your doctor tells  you to.  Do not use powders, sprays, or lotions on your genital area.  Check your skin around the catheter every day for signs of infection. Check for: ? Redness, swelling, or pain. ? Fluid or blood. ? Warmth. ? Pus or a bad smell. How to empty the bag Supplies needed  Rubbing alcohol.  Gauze pad or cotton ball.  Tape or a leg strap. Emptying the bag Pour the pee out of your bag when it is ?- full, or at least 2-3 times a day. Do this for your overnight bag and your leg bag. 1. Wash your hands with soap and water. 2. Separate (detach) the bag from your leg. 3. Hold the bag over the toilet or a clean pail. Keep the bag lower than your hips and bladder. This is so the pee (urine) does not go back into the tube. 4. Open the pour spout. It is at the bottom of the bag. 5. Empty the  pee into the toilet or pail. Do not let the pour spout touch any surface. 6. Put rubbing alcohol on a gauze pad or cotton ball. 7. Use the gauze pad or cotton ball to clean the pour spout. 8. Close the pour spout. 9. Attach the bag to your leg with tape or a leg strap. 10. Wash your hands with soap and water. Follow instructions for cleaning the drainage bag:  From the product maker.  As told by your doctor. How to change the bag Supplies needed  Alcohol wipes.  A clean bag.  Tape or a leg strap. Changing the bag Replace your bag when it starts to leak, smell bad, or look dirty. 1. Wash your hands with soap and water. 2. Separate the dirty bag from your leg. 3. Pinch the catheter with your fingers so that pee does not spill out. 4. Separate the catheter tube from the bag tube where these tubes connect (at the connection valve). Do not let the tubes touch any surface. 5. Clean the end of the catheter tube with an alcohol wipe. Use a different alcohol wipe to clean the end of the bag tube. 6. Connect the catheter tube to the tube of the clean bag. 7. Attach the clean bag to your leg with tape or a leg strap. Do not make the bag tight on your leg. 8. Wash your hands with soap and water. General rules   Never pull on your catheter. Never try to take it out. Doing that can hurt you.  Always wash your hands before and after you touch your catheter or bag. Use a mild, fragrance-free soap. If you do not have soap and water, use hand sanitizer.  Always make sure there are no twists or bends (kinks) in the catheter tube.  Always make sure there are no leaks in the catheter or bag.  Drink enough fluid to keep your pee pale yellow.  Do not take baths, swim, or use a hot tub.  If you are male, wipe from front to back after you poop (have a bowel movement). Contact a doctor if:  Your pee is cloudy.  Your pee smells worse than usual.  Your catheter gets clogged.  Your catheter  leaks.  Your bladder feels full. Get help right away if:  You have redness, swelling, or pain where the catheter goes into your body.  You have fluid, blood, pus, or a bad smell coming from the area where the catheter goes into your body.  Your skin feels warm  where the catheter goes into your body.  You have a fever.  You have pain in your: ? Belly (abdomen). ? Legs. ? Lower back. ? Bladder.  You see blood in the catheter.  Your pee is pink or red.  You feel sick to your stomach (nauseous).  You throw up (vomit).  You have chills.  Your pee is not draining into the bag.  Your catheter gets pulled out. Summary  An indwelling urinary catheter is a thin tube that is placed into the bladder to help drain pee (urine) out of the body.  The catheter is placed into the part of the body that drains pee from the bladder (urethra).  Taking good care of your catheter will keep it working properly and help prevent problems.  Always wash your hands before and after touching your catheter or bag.  Never pull on your catheter or try to take it out. This information is not intended to replace advice given to you by your health care provider. Make sure you discuss any questions you have with your health care provider. Document Revised: 05/25/2018 Document Reviewed: 09/16/2016 Elsevier Patient Education  2020 [place]   1) The drugs that you were given will stay in your system until tomorrow so for the next 24 hours you should not:  A) Drive an automobile B) Make any legal decisions C) Drink any alcoholic beverage   2) You may resume regular meals tomorrow.  Today it is better to start with liquids and gradually work up to solid foods.  You may eat anything you prefer, but it is better to start with liquids, then soup and crackers, and gradually work up to solid foods.   3) Please notify your doctor immediately if  you have any unusual bleeding, trouble breathing, redness and pain at the surgery site, drainage, fever, or pain not relieved by medication.    4) Additional Instructions:   Spouse to pick up abx from pharmacy and deliver to [facility] (per spouse, and ok'd with Silverio Decamp, RN at [facility].               Patient needs to follow up with his cardiologist for AFIB        Please contact your physician with any problems or Same Day Surgery at [phone], Monday through Friday 6 am to 4 pm, or [place] at [facility] number at [phone].

## 2019-09-21 NOTE — Progress Notes (Signed)
   09/20/19 0720  Clinical Encounter Type  Visited With Patient and family together  Visit Type Initial  Referral From Chaplain  Consult/Referral To Chaplain  Chaplain stopped in and briefly visited with patient and wife. Patient laid quiet while wife spoke. Wife said they were fine and chaplain wished them well.

## 2019-09-23 ENCOUNTER — Encounter: Payer: Self-pay | Admitting: Urology

## 2019-09-23 ENCOUNTER — Other Ambulatory Visit: Payer: Medicare Other

## 2019-09-25 DIAGNOSIS — B351 Tinea unguium: Secondary | ICD-10-CM

## 2019-09-25 NOTE — Anesthesia Postprocedure Evaluation (Signed)
Anesthesia Post Note  Patient: Shane Grant  Procedure(s) Performed: CYSTOSCOPY/URETEROSCOPY/HOLMIUM LASER/STENT PLACEMENT (Left )  Patient location during evaluation: PACU Anesthesia Type: General Level of consciousness: awake and alert Pain management: pain level controlled Vital Signs Assessment: post-procedure vital signs reviewed and stable Respiratory status: spontaneous breathing, nonlabored ventilation, respiratory function stable and patient connected to nasal cannula oxygen Cardiovascular status: blood pressure returned to baseline and stable Postop Assessment: no apparent nausea or vomiting Anesthetic complications: no   No complications documented.   Last Vitals:  Vitals:   09/20/19 1026 09/20/19 1052  BP:  92/65  Pulse: (!) 104 (!) 103  Resp: 18 18  Temp: 37 C   SpO2: 99% 98%    Last Pain:  Vitals:   09/20/19 1052  TempSrc:   PainSc: [place]

## 2019-09-26 ENCOUNTER — Other Ambulatory Visit: Payer: Self-pay

## 2019-09-26 ENCOUNTER — Encounter: Payer: Self-pay | Admitting: Physician Assistant

## 2019-09-26 ENCOUNTER — Encounter: Payer: Self-pay | Admitting: Urology

## 2019-09-26 ENCOUNTER — Ambulatory Visit (INDEPENDENT_AMBULATORY_CARE_PROVIDER_SITE_OTHER): Payer: Medicare Other | Admitting: Physician Assistant

## 2019-09-26 ENCOUNTER — Ambulatory Visit (INDEPENDENT_AMBULATORY_CARE_PROVIDER_SITE_OTHER): Payer: Medicare Other | Admitting: Urology

## 2019-09-26 VITALS — BP 105/78

## 2019-09-26 VITALS — BP 102/69 | HR 88 | Ht 78.0 in | Wt 226.0 lb

## 2019-09-26 DIAGNOSIS — R339 Retention of urine, unspecified: Secondary | ICD-10-CM | POA: Diagnosis not present

## 2019-09-26 DIAGNOSIS — N200 Calculus of kidney: Secondary | ICD-10-CM

## 2019-09-26 LAB — BLADDER SCAN AMB NON-IMAGING: Scan Result: 342

## 2019-09-26 MED ORDER — NITROFURANTOIN MONOHYD MACRO 100 MG PO CAPS
100.0000 mg | ORAL_CAPSULE | Freq: Once | ORAL | Status: AC
  Administered 2019-09-26: 100 mg via ORAL

## 2019-09-26 NOTE — Progress Notes (Signed)
Cystoscopy Procedure Note:  Indication: Stent removal s/p L URS/LL/stent for 5 mm distal stone, 1 cm renal stone.  Using shared decision-making, we opted to leave his lower pole partial staghorn stone in situ secondary to high risk of obstructive fragments and sepsis with treatment.    He also has had problems with incomplete bladder emptying and incontinence that I suspect is overflow incontinence related.  After informed consent and discussion of the procedure and its risks, Shane Grant was positioned and prepped in the standard fashion. Cystoscopy was performed with a flexible cystoscope. The stent was grasped with flexible graspers and removed in its entirety. The patient tolerated the procedure well.  Findings: Uncomplicated stent removal  Assessment and Plan: RTC this afternoon for PVR  Renal ultrasound in 2 months for evaluation of silent hydronephrosis as well as for active surveillance of known 3 cm right renal mass, and repeat PVR  Billey Co, MD 09/26/2019

## 2019-09-26 NOTE — Addendum Note (Signed)
Addended by: Billey Co on: 09/26/2019 09:48 AM   Modules accepted: Orders

## 2019-09-26 NOTE — Progress Notes (Signed)
Afternoon follow-up  Patient returned to clinic this afternoon for repeat PVR. He reports drinking approximately 36oz of fluid. He has been able to urinate. PVR 369mL.  Results for orders placed or performed in visit on 09/26/19  Bladder Scan (Post Void Residual) in office  Result Value Ref Range   Scan Result 342     In consultation with Dr. Diamantina Providence, will attempt conservative management with timed voiding with close follow-up.  Counseled patient to begin voiding every 2 hours regardless of urge during waking hours and 1 last time before going to bed daily.  Instructed him to return to clinic if he develops the inability to urinate, decreased urinary output, lower abdominal pain, or abdominal distention. He expressed understanding.  Follow up: Return in about 4 weeks (around 10/24/2019) for Symptom recheck with PVR with Dr. Diamantina Providence.

## 2019-09-26 NOTE — Patient Instructions (Signed)
1. Start urinating on a timed basis during the day, every 2 hours. 2. Urinate one last time before you go to bed every night. 3. Dr. Diamantina Providence will see you again in 1 month to scan your bladder again. Call our office if you develop the inability to urinate, decreased urinary output, lower abdominal pain, or abdominal swelling in the meantime.

## 2019-09-28 LAB — CALCULI, WITH PHOTOGRAPH (CLINICAL LAB)
Calcium Oxalate Monohydrate: 20 %
Uric Acid Calculi: 80 %
Weight Calculi: 6 mg

## 2019-10-03 DIAGNOSIS — I77810 Thoracic aortic ectasia: Secondary | ICD-10-CM | POA: Insufficient documentation

## 2019-10-03 NOTE — Progress Notes (Signed)
Cardiology Office Note  Date:  10/04/2019   ID:  Shane Grant, DOB [birth year], MRN [id number]  PCP:  Shane Carbon, MD   Chief Complaint  Patient presents with  . OTher    Self referreal - Patient was in the hospital for kidney stone and Minor Afib was picked up on EKG. Meds reviewed verbally with patient.     HPI:  [name] is a 86 year old gentleman with past medical history of Kidney stones/obstructive uropathy Chronic kidney disease Type 2 diabetes 5 cm ascending aorta aneurysm on CT scan July 2021 Presenting for new patient evaluation for paroxysmal atrial fibrillation  Lives at [facility] In the emergency room August 14, 2019 worsening weakness, urinary and fecal incontinence Noted to have several kidney stones Underwent cystoscopy, left ureteral stent placement Enterococcus urine culture positive Followed by urology  Wife  today reports that 2 out of 3 stones have been removed, 1 stone will be monitored and managed medically  In the nursing facility, tremendous leg weakness Gait instability, high fall risk, unable to stand with assistance from nursing BP low today 90 systolic Not eating much, recently started on Remeron  Other imaging studies reviewed Echocardiogram March 2014 ejection fraction 55%, no significant valve disease  CT scan chest performed July 2021 Ascending thoracic aorta aneurysm measuring 5 cm Aortic atherosclerosis  Reports of atrial fib after second stone removal in  recovery 09/20/2019 EKG reviewed from September 20, 2019 documenting atrial fibrillation  EKG personally reviewed by myself on todays visit Shows sinus tachycardia rate 109 bpm no significant ST-T wave changes  PMH:   has a past medical history of Colon cancer (Shane Grant) (2003), Diabetes mellitus, Hyperlipidemia, Hypertension (~1997), Kidney stones (2000), and Urge incontinence of urine.  PSH:    Past Surgical History:  Procedure Laterality Date  . APPENDECTOMY  2003    with colectomy  . CATARACT EXTRACTION W/ INTRAOCULAR LENS  IMPLANT, BILATERAL Bilateral 11/14, 12/14   Dr Shane Grant  . CYSTOSCOPY W/ RETROGRADES Left 08/15/2019   Procedure: CYSTOSCOPY WITH RETROGRADE PYELOGRAM;  Surgeon: Shane Co, MD;  Location: [facility] OR[facility];  Service: Urology;  Laterality: Left;  . CYSTOSCOPY WITH LITHOLAPAXY  08/15/2019   Procedure: CYSTOSCOPY WITH LITHOLAPAXY;  Surgeon: Shane Co, MD;  Location: [facility];  Service: Urology;;  . Consuela Mimes WITH STENT PLACEMENT Left 08/15/2019   Procedure: CYSTOSCOPY WITH STENT PLACEMENT;  Surgeon: Shane Co, MD;  Location: [facility];  Service: Urology;  Laterality: Left;  . CYSTOSCOPY/URETEROSCOPY/HOLMIUM LASER/STENT PLACEMENT Left 09/20/2019   Procedure: CYSTOSCOPY/URETEROSCOPY/HOLMIUM LASER/STENT PLACEMENT;  Surgeon: Shane Co, MD;  Location: [facility] OR[facility];  Service: Urology;  Laterality: Left;  . Hepatic lesion  2011   Cryoablation by radiologist  . LITHOTRIPSY  2000  . PARTIAL COLECTOMY  2003   ascending colon  . TONSILLECTOMY AND ADENOIDECTOMY  1947    Current Outpatient Medications  Medication Sig Dispense Refill  . aspirin 81 MG chewable tablet Chew 81 mg by mouth daily.    Marland Kitchen atorvastatin (LIPITOR) 80 MG tablet Take 1 tablet (80 mg total) by mouth daily. 90 tablet 3  . bismuth subsalicylate (PEPTO BISMOL) 262 MG/15ML suspension Take 30 mLs by mouth every 4 (four) hours as needed for diarrhea or loose stools.    Marland Kitchen glipiZIDE (GLUCOTROL) 5 MG tablet Take 1 tablet (5 mg total) by mouth 2 (two) times daily before a meal. 180 tablet 3  . Insulin Glargine (LANTUS SOLOSTAR) 100 UNIT/ML Solostar Pen INJECT 10 UNITS SUBCUTANEOUSLY ONCE DAILY  15 mL 11  . metFORMIN (GLUCOPHAGE) 1000 MG tablet Take 1 tablet (1,000 mg total) by mouth 2 (two) times daily with a meal. 180 tablet 1  . mirtazapine (REMERON) 7.5 MG tablet Take 7.5 mg by mouth at bedtime.     No current facility-administered medications for this visit.     Allergies:    Invokana [canagliflozin] and Ancef [cefazolin sodium]   Social History:  The patient  reports that he has quit smoking. His smoking use included cigars. He has never used smokeless tobacco. He reports current alcohol use. He reports that he does not use drugs.   Family History:   family history includes Cancer in his brother.    Review of Systems: Review of Systems  Unable to perform ROS: Critical illness   Most of the history provided by the wife, patient is relatively nonverbal  PHYSICAL EXAM: VS:  BP (!) 90/50 (BP Location: Right Arm, Patient Position: Sitting, Cuff Size: Normal)   Pulse (!) 109   Ht 6\' 6"  (1.981 m)   BMI 26.12 kg/m  , BMI Body mass index is 26.12 kg/m. GEN: Thin, pale, in a wheelchair, in no acute distress, cough noticed HEENT: normal Neck: no JVD, carotid bruits, or masses Cardiac: RRR; no murmurs, rubs, or gallops,no edema  Respiratory:  clear to auscultation bilaterally, normal work of breathing GI: soft, nontender, nondistended, + BS MS: no deformity or atrophy Skin: warm and dry, no rash Neuro:  Strength and sensation are intact Psych: Relatively nonverbal, alert  Recent Labs: 08/14/2019: ALT 37; Magnesium 2.3 08/16/2019: BUN 34; Creatinine, Ser 1.62; Hemoglobin 10.1; Platelets 230; Potassium 4.7; Sodium 139    Lipid Panel Lab Results  Component Value Date   CHOL 155 03/01/2019   HDL 41.70 03/01/2019   LDLCALC 87 03/01/2019   TRIG 132.0 03/01/2019      Wt Readings from Last 3 Encounters:  09/26/19 226 lb (102.5 kg)  09/20/19 226 lb (102.5 kg)  08/27/19 228 lb (103.4 kg)      ASSESSMENT AND PLAN:  Problem List Items Addressed This Visit      Cardiology Problems   Ascending aorta dilatation (HCC)   Hypertension     Other   Type 2 diabetes, controlled, with neuropathy ([place]) - Primary    Other Visit Diagnoses    Hypotension due to hypovolemia       PAF (paroxysmal atrial fibrillation) (HCC)         Hypotension Has had dramatic  weight loss over the past year, likely contributing to low blood pressure Orthostasis may be contributing to inability to work with PT, gait instability --We will recommend daily blood pressure checks sitting and standing at the nursing facility in the next 2 weeks -For hypotension with orthostasis numbers, would consider midodrine 5 mg 3 times daily with titration up to 10 mg 3 times daily to maintain systolic pressure greater than 100 with standing -Low blood pressure likely exacerbated by traumatic weight loss  Ascending aorta dilation Measured at 5 cm on CT scan, medical management recommended, not a good candidate for surgery if this does his greater than 5.5 cm  Paroxysmal atrial fibrillation Noted September 20, 2019 following removal of kidney stone In his current state, unable to add rate controlling agent such as beta-blockers or calcium channel blockers as he has hypotension -Good candidate for anticoagulation at this time given high fall risk and age -The above was discussed with the patient's wife, she is in agreement If there is concern  of arrhythmia based on vitals check, could order a 2-week Zio monitor to define arrhythmia burden  Severe protein, malnutrition Agree with Remeron, need to push fluids and meals Low weight likely contributing to hypertension    Total encounter time more than 60 minutes  Greater than 50% was spent in counseling and coordination of care with the patient    Signed, Esmond Plants, M.D., Ph.D. [place], [place]

## 2019-10-04 ENCOUNTER — Other Ambulatory Visit: Payer: Self-pay

## 2019-10-04 ENCOUNTER — Ambulatory Visit (INDEPENDENT_AMBULATORY_CARE_PROVIDER_SITE_OTHER): Payer: Medicare Other | Admitting: Cardiovascular Disease

## 2019-10-04 VITALS — BP 90/50 | HR 109 | Ht 78.0 in

## 2019-10-04 DIAGNOSIS — I480 Paroxysmal atrial fibrillation: Secondary | ICD-10-CM

## 2019-10-04 DIAGNOSIS — E861 Hypovolemia: Secondary | ICD-10-CM | POA: Diagnosis not present

## 2019-10-04 DIAGNOSIS — I10 Essential (primary) hypertension: Secondary | ICD-10-CM

## 2019-10-04 DIAGNOSIS — E1140 Type 2 diabetes mellitus with diabetic neuropathy, unspecified: Secondary | ICD-10-CM

## 2019-10-04 DIAGNOSIS — I77810 Thoracic aortic ectasia: Secondary | ICD-10-CM | POA: Diagnosis not present

## 2019-10-04 DIAGNOSIS — I9589 Other hypotension: Secondary | ICD-10-CM

## 2019-10-04 NOTE — Patient Instructions (Signed)
Please monitor blood pressure daily with sitting and standing (orthostatics) Please fax blood pressures to [facility] weekly For orthostasis, may need to add midodrine for blood pressure support  Encouraged increased fluid intake, higher calorie intake Low blood pressure likely exacerbated from recent weight loss Low blood pressure will make it difficult to work with PT, may need midodrine support   Medication Instructions:  No changes  If you need a refill on your cardiac medications before your next appointment, please call your pharmacy.    Lab work: No new labs needed   If you have labs (blood work) drawn today and your tests are completely normal, you will receive your results only by: Marland Kitchen MyChart Message (if you have MyChart) OR . A paper copy in the mail If you have any lab test that is abnormal or we need to change your treatment, we will call you to review the results.   Testing/Procedures: No new testing needed   Follow-Up: At [facility], you and your health needs are our priority.  As part of our continuing mission to provide you with exceptional heart care, we have created designated Provider Care Teams.  These Care Teams include your primary Cardiologist (physician) and Advanced Practice Providers (APPs -  Physician Assistants and Nurse Practitioners) who all work together to provide you with the care you need, when you need it.  . You will need a follow up appointment in 1 month  . Providers on your designated Care Team:   . Murray Hodgkins, NP . Christell Faith, PA-C . Marrianne Mood, PA-C  Any Other Special Instructions Will Be Listed Below (If Applicable).  COVID-19 Vaccine Information can be found at: ShippingScam.co.uk For questions related to vaccine distribution or appointments, please email [email or web address] or call [phone].

## 2019-10-06 ENCOUNTER — Inpatient Hospital Stay
Admission: EM | Admit: 2019-10-06 | Discharge: 2019-10-16 | DRG: 871 | Disposition: E | Payer: Medicare Other | Source: Skilled Nursing Facility | Attending: Internal Medicine | Admitting: Internal Medicine

## 2019-10-06 ENCOUNTER — Emergency Department: Payer: Medicare Other

## 2019-10-06 ENCOUNTER — Other Ambulatory Visit: Payer: Self-pay

## 2019-10-06 DIAGNOSIS — I480 Paroxysmal atrial fibrillation: Secondary | ICD-10-CM | POA: Diagnosis present

## 2019-10-06 DIAGNOSIS — Z66 Do not resuscitate: Secondary | ICD-10-CM | POA: Diagnosis present

## 2019-10-06 DIAGNOSIS — N1330 Unspecified hydronephrosis: Secondary | ICD-10-CM | POA: Diagnosis not present

## 2019-10-06 DIAGNOSIS — Z79899 Other long term (current) drug therapy: Secondary | ICD-10-CM

## 2019-10-06 DIAGNOSIS — I712 Thoracic aortic aneurysm, without rupture: Secondary | ICD-10-CM | POA: Diagnosis not present

## 2019-10-06 DIAGNOSIS — Z515 Encounter for palliative care: Secondary | ICD-10-CM | POA: Diagnosis not present

## 2019-10-06 DIAGNOSIS — R6521 Severe sepsis with septic shock: Secondary | ICD-10-CM | POA: Diagnosis present

## 2019-10-06 DIAGNOSIS — E8770 Fluid overload, unspecified: Secondary | ICD-10-CM | POA: Diagnosis present

## 2019-10-06 DIAGNOSIS — K810 Acute cholecystitis: Secondary | ICD-10-CM | POA: Diagnosis not present

## 2019-10-06 DIAGNOSIS — Z801 Family history of malignant neoplasm of trachea, bronchus and lung: Secondary | ICD-10-CM

## 2019-10-06 DIAGNOSIS — G9341 Metabolic encephalopathy: Secondary | ICD-10-CM | POA: Diagnosis not present

## 2019-10-06 DIAGNOSIS — E872 Acidosis, unspecified: Secondary | ICD-10-CM | POA: Diagnosis present

## 2019-10-06 DIAGNOSIS — R05 Cough: Secondary | ICD-10-CM | POA: Diagnosis not present

## 2019-10-06 DIAGNOSIS — Z794 Long term (current) use of insulin: Secondary | ICD-10-CM

## 2019-10-06 DIAGNOSIS — R0602 Shortness of breath: Secondary | ICD-10-CM | POA: Diagnosis not present

## 2019-10-06 DIAGNOSIS — Z87891 Personal history of nicotine dependence: Secondary | ICD-10-CM

## 2019-10-06 DIAGNOSIS — J181 Lobar pneumonia, unspecified organism: Secondary | ICD-10-CM | POA: Diagnosis not present

## 2019-10-06 DIAGNOSIS — R6520 Severe sepsis without septic shock: Secondary | ICD-10-CM | POA: Diagnosis not present

## 2019-10-06 DIAGNOSIS — N390 Urinary tract infection, site not specified: Secondary | ICD-10-CM | POA: Diagnosis present

## 2019-10-06 DIAGNOSIS — I10 Essential (primary) hypertension: Secondary | ICD-10-CM | POA: Diagnosis present

## 2019-10-06 DIAGNOSIS — N200 Calculus of kidney: Secondary | ICD-10-CM | POA: Diagnosis present

## 2019-10-06 DIAGNOSIS — Z9049 Acquired absence of other specified parts of digestive tract: Secondary | ICD-10-CM

## 2019-10-06 DIAGNOSIS — F419 Anxiety disorder, unspecified: Secondary | ICD-10-CM | POA: Diagnosis present

## 2019-10-06 DIAGNOSIS — Z888 Allergy status to other drugs, medicaments and biological substances status: Secondary | ICD-10-CM

## 2019-10-06 DIAGNOSIS — Z9841 Cataract extraction status, right eye: Secondary | ICD-10-CM

## 2019-10-06 DIAGNOSIS — R778 Other specified abnormalities of plasma proteins: Secondary | ICD-10-CM

## 2019-10-06 DIAGNOSIS — J690 Pneumonitis due to inhalation of food and vomit: Secondary | ICD-10-CM | POA: Diagnosis present

## 2019-10-06 DIAGNOSIS — Z961 Presence of intraocular lens: Secondary | ICD-10-CM | POA: Diagnosis present

## 2019-10-06 DIAGNOSIS — A419 Sepsis, unspecified organism: Principal | ICD-10-CM | POA: Diagnosis present

## 2019-10-06 DIAGNOSIS — K5641 Fecal impaction: Secondary | ICD-10-CM

## 2019-10-06 DIAGNOSIS — Z20822 Contact with and (suspected) exposure to covid-19: Secondary | ICD-10-CM | POA: Diagnosis not present

## 2019-10-06 DIAGNOSIS — Z7982 Long term (current) use of aspirin: Secondary | ICD-10-CM

## 2019-10-06 DIAGNOSIS — N139 Obstructive and reflux uropathy, unspecified: Secondary | ICD-10-CM | POA: Clinically undetermined

## 2019-10-06 DIAGNOSIS — E11649 Type 2 diabetes mellitus with hypoglycemia without coma: Secondary | ICD-10-CM | POA: Diagnosis present

## 2019-10-06 DIAGNOSIS — Z87442 Personal history of urinary calculi: Secondary | ICD-10-CM

## 2019-10-06 DIAGNOSIS — N179 Acute kidney failure, unspecified: Secondary | ICD-10-CM

## 2019-10-06 DIAGNOSIS — E785 Hyperlipidemia, unspecified: Secondary | ICD-10-CM | POA: Diagnosis present

## 2019-10-06 DIAGNOSIS — N138 Other obstructive and reflux uropathy: Secondary | ICD-10-CM | POA: Diagnosis present

## 2019-10-06 DIAGNOSIS — Z9842 Cataract extraction status, left eye: Secondary | ICD-10-CM

## 2019-10-06 DIAGNOSIS — N401 Enlarged prostate with lower urinary tract symptoms: Secondary | ICD-10-CM | POA: Diagnosis present

## 2019-10-06 DIAGNOSIS — E1140 Type 2 diabetes mellitus with diabetic neuropathy, unspecified: Secondary | ICD-10-CM | POA: Diagnosis present

## 2019-10-06 DIAGNOSIS — Z85038 Personal history of other malignant neoplasm of large intestine: Secondary | ICD-10-CM

## 2019-10-06 DIAGNOSIS — I4891 Unspecified atrial fibrillation: Secondary | ICD-10-CM

## 2019-10-06 LAB — BLOOD GAS, VENOUS
Acid-base deficit: 4.2 mmol/L — ABNORMAL HIGH (ref 0.0–2.0)
Bicarbonate: 21 mmol/L (ref 20.0–28.0)
O2 Saturation: 21.6 %
Patient temperature: 37
pCO2, Ven: 38 mmHg — ABNORMAL LOW (ref 44.0–60.0)
pH, Ven: 7.35 (ref 7.250–7.430)
pO2, Ven: <31 mmHg — CL (ref 32.0–45.0)

## 2019-10-06 LAB — SARS CORONAVIRUS 2 BY RT PCR (HOSPITAL ORDER, PERFORMED IN ~~LOC~~ HOSPITAL LAB): SARS Coronavirus 2: NEGATIVE

## 2019-10-06 LAB — CBC WITH DIFFERENTIAL/PLATELET
Abs Immature Granulocytes: 0.46 10*3/uL — ABNORMAL HIGH (ref 0.00–0.07)
Basophils Absolute: 0.1 10*3/uL (ref 0.0–0.1)
Basophils Relative: 0 %
Eosinophils Absolute: 0 10*3/uL (ref 0.0–0.5)
Eosinophils Relative: 0 %
HCT: 30.8 % — ABNORMAL LOW (ref 39.0–52.0)
Hemoglobin: 9.6 g/dL — ABNORMAL LOW (ref 13.0–17.0)
Immature Granulocytes: 1 %
Lymphocytes Relative: 4 %
Lymphs Abs: 1.4 10*3/uL (ref 0.7–4.0)
MCH: 29.4 pg (ref 26.0–34.0)
MCHC: 31.2 g/dL (ref 30.0–36.0)
MCV: 94.2 fL (ref 80.0–100.0)
Monocytes Absolute: 2.6 10*3/uL — ABNORMAL HIGH (ref 0.1–1.0)
Monocytes Relative: 7 %
Neutro Abs: 35.2 10*3/uL — ABNORMAL HIGH (ref 1.7–7.7)
Neutrophils Relative %: 88 %
Platelets: 479 10*3/uL — ABNORMAL HIGH (ref 150–400)
RBC: 3.27 MIL/uL — ABNORMAL LOW (ref 4.22–5.81)
RDW: 14.1 % (ref 11.5–15.5)
Smear Review: NORMAL
WBC: 39.8 10*3/uL — ABNORMAL HIGH (ref 4.0–10.5)
nRBC: 0 % (ref 0.0–0.2)

## 2019-10-06 LAB — COMPREHENSIVE METABOLIC PANEL
ALT: 37 U/L (ref 0–44)
AST: 59 U/L — ABNORMAL HIGH (ref 15–41)
Albumin: 2.3 g/dL — ABNORMAL LOW (ref 3.5–5.0)
Alkaline Phosphatase: 167 U/L — ABNORMAL HIGH (ref 38–126)
Anion gap: 17 — ABNORMAL HIGH (ref 5–15)
BUN: 81 mg/dL — ABNORMAL HIGH (ref 8–23)
CO2: 18 mmol/L — ABNORMAL LOW (ref 22–32)
Calcium: 9.1 mg/dL (ref 8.9–10.3)
Chloride: 105 mmol/L (ref 98–111)
Creatinine, Ser: 4.08 mg/dL — ABNORMAL HIGH (ref 0.61–1.24)
GFR calc Af Amer: 14 mL/min — ABNORMAL LOW (ref >60)
GFR calc non Af Amer: 12 mL/min — ABNORMAL LOW (ref >60)
Glucose, Bld: 60 mg/dL — ABNORMAL LOW (ref 70–99)
Potassium: 4.9 mmol/L (ref 3.5–5.1)
Sodium: 140 mmol/L (ref 135–145)
Total Bilirubin: 1 mg/dL (ref 0.3–1.2)
Total Protein: 7.6 g/dL (ref 6.5–8.1)

## 2019-10-06 LAB — PROCALCITONIN: Procalcitonin: 1.43 ng/mL

## 2019-10-06 LAB — LACTIC ACID, PLASMA
Lactic Acid, Venous: 4.5 mmol/L (ref 0.5–1.9)
Lactic Acid, Venous: 6.9 mmol/L (ref 0.5–1.9)
Lactic Acid, Venous: 7.9 mmol/L (ref 0.5–1.9)

## 2019-10-06 LAB — APTT: aPTT: 40 seconds — ABNORMAL HIGH (ref 24–36)

## 2019-10-06 LAB — MAGNESIUM: Magnesium: 1.8 mg/dL (ref 1.7–2.4)

## 2019-10-06 LAB — LIPASE, BLOOD: Lipase: 36 U/L (ref 11–51)

## 2019-10-06 LAB — TROPONIN I (HIGH SENSITIVITY)
Troponin I (High Sensitivity): 380 ng/L (ref <18)
Troponin I (High Sensitivity): 342 ng/L (ref <18)
Troponin I (High Sensitivity): 283 ng/L (ref <18)

## 2019-10-06 LAB — PROTIME-INR
INR: 1.4 — ABNORMAL HIGH (ref 0.8–1.2)
Prothrombin Time: 17 seconds — ABNORMAL HIGH (ref 11.4–15.2)

## 2019-10-06 LAB — GLUCOSE, CAPILLARY
Glucose-Capillary: 104 mg/dL — ABNORMAL HIGH (ref 70–99)
Glucose-Capillary: 266 mg/dL — ABNORMAL HIGH (ref 70–99)
Glucose-Capillary: 41 mg/dL — CL (ref 70–99)

## 2019-10-06 LAB — BRAIN NATRIURETIC PEPTIDE: B Natriuretic Peptide: 707.2 pg/mL — ABNORMAL HIGH (ref 0.0–100.0)

## 2019-10-06 MED ORDER — DEXTROSE 50 % IV SOLN
25.0000 g | Freq: Once | INTRAVENOUS | Status: AC
  Administered 2019-10-06: 25 g via INTRAVENOUS
  Filled 2019-10-06: qty 50

## 2019-10-06 MED ORDER — SODIUM CHLORIDE 0.9 % IV SOLN
2.0000 g | Freq: Once | INTRAVENOUS | Status: AC
  Administered 2019-10-06: 2 g via INTRAVENOUS
  Filled 2019-10-06: qty 2

## 2019-10-06 MED ORDER — VANCOMYCIN HCL IN DEXTROSE 1-5 GM/200ML-% IV SOLN
1000.0000 mg | Freq: Once | INTRAVENOUS | Status: AC
  Administered 2019-10-06: 1000 mg via INTRAVENOUS
  Filled 2019-10-06: qty 200

## 2019-10-06 MED ORDER — SODIUM CHLORIDE 0.9% FLUSH: 3.0000 mL | INTRAVENOUS | Status: DC | PRN

## 2019-10-06 MED ORDER — LACTATED RINGERS IV BOLUS (SEPSIS)
1000.0000 mL | Freq: Once | INTRAVENOUS | Status: AC
  Administered 2019-10-06: 1000 mL via INTRAVENOUS

## 2019-10-06 MED ORDER — ASPIRIN 325 MG PO TABS
325.0000 mg | ORAL_TABLET | Freq: Every day | ORAL | Status: DC
  Filled 2019-10-06: qty 1

## 2019-10-06 MED ORDER — VANCOMYCIN HCL IN DEXTROSE 1-5 GM/200ML-% IV SOLN: 1000.0000 mg | INTRAVENOUS | Status: DC

## 2019-10-06 MED ORDER — MORPHINE SULFATE (PF) 2 MG/ML IV SOLN
1.0000 mg | INTRAVENOUS | Status: DC | PRN
Start: 2019-10-06 — End: 2019-10-06

## 2019-10-06 MED ORDER — DEXTROSE 50 % IV SOLN
1.0000 | Freq: Once | INTRAVENOUS | Status: AC
  Administered 2019-10-06: 50 mL via INTRAVENOUS

## 2019-10-06 MED ORDER — SODIUM CHLORIDE 0.9 % IV SOLN: 250.0000 mL | INTRAVENOUS | Status: DC | PRN

## 2019-10-06 MED ORDER — SODIUM CHLORIDE 0.9 % IV SOLN: 2.0000 g | INTRAVENOUS | Status: DC

## 2019-10-06 MED ORDER — MORPHINE SULFATE (PF) 2 MG/ML IV SOLN
2.0000 mg | INTRAVENOUS | Status: DC | PRN
  Administered 2019-10-06 – 2019-10-07 (×3): 2 mg via INTRAVENOUS
  Filled 2019-10-06 (×3): qty 1

## 2019-10-06 MED ORDER — LACTATED RINGERS IV SOLN: INTRAVENOUS | Status: AC

## 2019-10-06 MED ORDER — SODIUM CHLORIDE 0.9% FLUSH
3.0000 mL | Freq: Two times a day (BID) | INTRAVENOUS | Status: DC
  Administered 2019-10-06 – 2019-10-09 (×5): 3 mL via INTRAVENOUS

## 2019-10-06 MED ORDER — VANCOMYCIN HCL 1250 MG/250ML IV SOLN
1250.0000 mg | Freq: Once | INTRAVENOUS | Status: DC
  Filled 2019-10-06: qty 250

## 2019-10-06 MED ORDER — METRONIDAZOLE IVPB CUSTOM: 250.0000 mg | Freq: Three times a day (TID) | INTRAVENOUS | Status: DC

## 2019-10-06 MED ORDER — METRONIDAZOLE IN NACL 5-0.79 MG/ML-% IV SOLN
500.0000 mg | Freq: Once | INTRAVENOUS | Status: AC
  Administered 2019-10-06: 500 mg via INTRAVENOUS
  Filled 2019-10-06: qty 100

## 2019-10-06 MED ORDER — SODIUM CHLORIDE 0.9 % IV SOLN: 2.0000 g | Freq: Two times a day (BID) | INTRAVENOUS | Status: DC

## 2019-10-06 MED ORDER — PANTOPRAZOLE SODIUM 40 MG IV SOLR
40.0000 mg | Freq: Two times a day (BID) | INTRAVENOUS | Status: DC
  Administered 2019-10-06: 40 mg via INTRAVENOUS
  Filled 2019-10-06: qty 40

## 2019-10-06 MED ORDER — METRONIDAZOLE IN NACL 5-0.79 MG/ML-% IV SOLN: 500.0000 mg | Freq: Three times a day (TID) | INTRAVENOUS | Status: DC

## 2019-10-06 MED ORDER — ALBUMIN HUMAN 25 % IV SOLN
25.0000 g | Freq: Once | INTRAVENOUS | Status: AC
  Administered 2019-10-06: 25 g via INTRAVENOUS
  Filled 2019-10-06: qty 100

## 2019-10-06 NOTE — ED Notes (Signed)
Dr. Damita Dunnings messaged "Dr. Damita Dunnings, pt in ER 9 has a glucose of 41. We gave 1 amp of D50. Pt's troponin is 283 which is down from 342. Pt's lactic acid has risen from 6.9 to 7.9. After talking with family, they would like pt to be IVF and pain medicine only. Familu does not want antibiotics given after the current vancomycin that is infusing. Family would also like to speak with a provider."

## 2019-10-06 NOTE — Consult Note (Signed)
PHARMACY -  BRIEF ANTIBIOTIC NOTE   Pharmacy has received consult(s) for cefepime and vancomycin from an ED provider. Patient is also ordered metronidazole. The patient's profile has been reviewed for ht/wt/allergies/indication/available labs.    Patient does have an intolerance to cefazolin listed (nausea only).   One time order(s) placed for  --Vancomycin 1 g (already ordered) --Cefepime 2 g (already ordered)  Further antibiotics/pharmacy consults should be ordered by admitting physician if indicated.                       Thank you, Benita Gutter 10/04/2019  2:50 PM

## 2019-10-06 NOTE — ED Notes (Signed)
As per family, they only want pt to receive IVF and pain medication, along with D50, as needed for low blood sugar.  Unit Secretary Paged hospitalist for RN as she has not responded to message through secure chat

## 2019-10-06 NOTE — ED Provider Notes (Signed)
----------------------------------------- °  3:06 PM on 10/04/2019 -----------------------------------------  Blood pressure 106/63, pulse (!) 120, temperature 97.8 F (36.6 C), temperature source Oral, resp. rate (!) 25, height 6\' 6"  (1.981 m), weight 94.1 kg, SpO2 94 %.  Assuming care from Dr. Tamala Julian.  In short, Shane Grant is a 86 y.o. male with a chief complaint of Hypoglycemia .  Refer to the original H&P for additional details.  The current plan of care is to follow-up CT results and reassess following resuscitation for sepsis.  ----------------------------------------- 5:19 PM on 09/26/2019 -----------------------------------------  On reassessment, patient is awake and alert but disoriented, heart rate seems to be gradually improving following IV fluid resuscitation but he remains in atrial fibrillation.  I spoke with patient's wife at the bedside regarding CODE STATUS, and she wishes patient to be DNR at this time, order for DNR was placed.  Case discussed with Dr. Mortimer Fries in the ICU, who evaluated the patient at the bedside and feels he is most appropriate for stepdown.  Case discussed with hospitalist for admission.  Case also discussed with Dr. Ellyn Hack of cardiology at request of hospitalist.  He agrees that elevated troponin in the setting of sepsis and atrial fibrillation with RVR is likely due to demand ischemia.  He recommends continued resuscitation for sepsis and addition of 8 amiodarone if patient remains in A. fib RVR with any hemodynamic instability.    Blake Divine, MD 09/15/2019 1728

## 2019-10-06 NOTE — ED Notes (Signed)
As per family request, unable to give 2nd IV. LR currently infusing in current IV.
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
## 2019-10-06 NOTE — ED Notes (Signed)
Attempted in and out and was only able to obtain a small amount of pus- Dr Charna Archer aware and gave VO for foley catheter- unable to pass a regular foley and Dr Posey Pronto gave VO for coude cath

## 2019-10-06 NOTE — Consult Note (Signed)
Name: Shane Grant MRN: [id number] DOB: Mar 16, 1931     CONSULTATION DATE: 09/17/2019  REFERRING MD : Charna Archer  CHIEF COMPLAINT: abd pain  STUDIES:     CT ABD/Chest +pneumonia, +cholecystitis   HISTORY OF PRESENT ILLNESS: 86 y.o. male  with past medical history of Kidney stones/obstructive uropathy, Chronic kidney disease, Type 2 diabetes, 5 cm ascending aorta aneurysm on CT scan July 2021 and paroxysmal A. fibn   presents via EMS from a nursing facility for assessment of shortness of breath associated with abdominal pain, hypoglycemia, and borderline low blood pressures per EMS who spoke with facility staff.     Dx of sepsis sepsis protocol started LA 4.5 7.35/38 Creat 4 Responding to IVF's   BP 106/63 (BP Location: Right Wrist)   Pulse (!) 115   Temp 97.8 F (36.6 C) (Oral)   Resp (!) 25   Ht 6\' 6"  (1.981 m)   Wt 94.1 kg   SpO2 95%   BMI 23.98 kg/m     PAST MEDICAL HISTORY :   has a past medical history of Colon cancer ([place]) (2003), Diabetes mellitus, Hyperlipidemia, Hypertension (~1997), Kidney stones (2000), and Urge incontinence of urine.  has a past surgical history that includes Tonsillectomy and adenoidectomy (1947); Appendectomy (2003); Partial colectomy (2003); Hepatic lesion (2011); Lithotripsy (2000); Cataract extraction w/ intraocular lens  implant, bilateral (Bilateral, 11/14, 12/14); Cystoscopy with stent placement (Left, 08/15/2019); Cystoscopy w/ retrogrades (Left, 08/15/2019); Cystoscopy with litholapaxy (08/15/2019); and Cystoscopy/ureteroscopy/holmium laser/stent placement (Left, 09/20/2019). Prior to Admission medications   Medication Sig Start Date End Date Taking? Authorizing Provider  aspirin 81 MG chewable tablet Chew 81 mg by mouth daily.    [provider]  atorvastatin (LIPITOR) 80 MG tablet Take 1 tablet (80 mg total) by mouth daily. 03/22/19   Venia Carbon, MD  bismuth subsalicylate (PEPTO BISMOL) 262 MG/15ML suspension Take 30 mLs  by mouth every 4 (four) hours as needed for diarrhea or loose stools.    [provider]  glipiZIDE (GLUCOTROL) 5 MG tablet Take 1 tablet (5 mg total) by mouth 2 (two) times daily before a meal. 04/02/19   Venia Carbon, MD  Insulin Glargine (LANTUS SOLOSTAR) 100 UNIT/ML Solostar Pen INJECT 10 UNITS SUBCUTANEOUSLY ONCE DAILY 12/05/18   Venia Carbon, MD  metFORMIN (GLUCOPHAGE) 1000 MG tablet Take 1 tablet (1,000 mg total) by mouth 2 (two) times daily with a meal. 03/18/19   Venia Carbon, MD  mirtazapine (REMERON) 7.5 MG tablet Take 7.5 mg by mouth at bedtime.    [provider]   Allergies  Allergen Reactions  . Invokana [Canagliflozin]     Raised sugars and he felt bad  . Ancef [Cefazolin Sodium] Nausea Only    FAMILY HISTORY:  family history includes Cancer in his brother. SOCIAL HISTORY:  reports that he has quit smoking. His smoking use included cigars. He has never used smokeless tobacco. He reports current alcohol use. He reports that he does not use drugs.    Review of Systems:  Gen:  Denies  fever, sweats, chills weigh loss  HEENT: Denies blurred vision, double vision, ear pain, eye pain, hearing loss, nose bleeds, sore throat Cardiac:  No dizziness, chest pain or heaviness, chest tightness,edema, No JVD Resp:   No cough, -sputum production, -shortness of breath,-wheezing, -hemoptysis,  Gi: Denies swallowing difficulty, stomach pain, nausea or vomiting, diarrhea, constipation, bowel incontinence Gu:  Denies bladder incontinence, burning urine Ext:   Denies Joint pain, stiffness or swelling Skin:  Denies  skin rash, easy bruising or bleeding or hives Endoc:  Denies polyuria, polydipsia , polyphagia or weight change Psych:   Denies depression, insomnia or hallucinations  Other:  All other systems negative     VITAL SIGNS: Temp:  [97.8 F (36.6 C)] 97.8 F (36.6 C) (08/22 1455) Pulse Rate:  [115-120] 115 (08/22 1601) Resp:  [25] 25 (08/22  1601) BP: (106)/(63) 106/63 (08/22 1435) SpO2:  [83 %-95 %] 95 % (08/22 1601) Weight:  [94.1 kg] 94.1 kg (08/22 1349)     SpO2: 95 % O2 Flow Rate (L/min): 2 L/min    Physical Examination:   GENERAL:sepsis, ill appearing HEAD: Normocephalic, atraumatic.  EYES: PERLA, EOMI No scleral icterus.  MOUTH: Moist mucosal membrane.  EAR, NOSE, THROAT: Clear without exudates. No external lesions.  NECK: Supple.  PULMONARY: CTA B/L +wheezing, +rhonchi, crackles CARDIOVASCULAR: S1 and S2. Regular rate and rhythm. No murmurs GASTROINTESTINAL: Soft, nontender, nondistended. Positive bowel sounds.  MUSCULOSKELETAL: - edema.  NEUROLOGIC: No gross focal neurological deficits.  SKIN: No ulceration, lesions, rashes, or cyanosis.  PSYCHIATRIC: poor Insight,  ALL OTHER ROS ARE NEGATIVE   MEDICATIONS: I have reviewed all medications and confirmed regimen as documented    CULTURE RESULTS   No results found for this or any previous visit (from the past 240 hour(s)).        IMAGING    CT ABDOMEN PELVIS WO CONTRAST  Result Date: 09/21/2019 CLINICAL DATA:  Cough, persistent cough EXAM: CT CHEST, ABDOMEN AND PELVIS WITHOUT CONTRAST TECHNIQUE: Multidetector CT imaging of the chest, abdomen and pelvis was performed following the standard protocol without IV contrast. COMPARISON:  Chest evaluation from July of 2021, abdomen and pelvis from August 14, 2019 FINDINGS: CT CHEST FINDINGS Cardiovascular: Calcified atheromatous plaque of the thoracic aorta. Calcified aortic valve. Calcified coronary artery disease. Normal heart size. No pericardial effusion. Limited assessment of vascular structures in the chest due to lack of intravenous contrast. 4.6 cm ascending thoracic aorta. Central pulmonary arteries dilated to 4.2 cm. Mediastinum/Nodes: Thoracic inlet structures are normal. No axillary lymphadenopathy. No mediastinal lymphadenopathy. No gross hilar adenopathy. Assessment limited by lack of intravenous  contrast. Esophagus normal. Lungs/Pleura: Patchy areas of tree-in-bud opacity and scattered nodules in the upper lobes with similar appearance. Interval development of similar process in the superior segment of the LEFT lower lobe but with consolidative changes at the LEFT lung base and signs of bronchial wall thickening that are new when compared to previous imaging. RIGHT lower lobe atelectasis. RIGHT lower lobe bronchial wall thickening.  Airways are patent. Musculoskeletal: No chest wall mass. See below for full musculoskeletal details. CT ABDOMEN PELVIS FINDINGS Hepatobiliary: No focal, suspicious hepatic lesion on noncontrast imaging. Marked pericholecystic stranding in the setting of cholelithiasis with distended gallbladder. Small amount of pericholecystic fluid. Pancreas: Pancreatic atrophy without peripancreatic stranding. Spleen: Spleen normal in size and contour. Adrenals/Urinary Tract: Adrenal glands are normal. Nephrolithiasis with large LEFT renal calculi. Resolution of hydronephrosis that was seen on the previous imaging study on the LEFT. Dilated lower pole collecting systems that appears to be a chronic finding similar to the prior study. RIGHT-sided nephrolithiasis. Intermediate density lesion arising from the medial upper pole of the RIGHT kidney associated with calcification measuring approximately 3.7 cm greatest dimension, similar size as measured by this observer on the prior study. Urinary bladder is normal. Stomach/Bowel: Stomach under distended. Mild stranding adjacent to the duodenum presumably related to adjacent acute cholecystitis. No acute small bowel process. Post RIGHT hemicolectomy with secondary  thickening also of the ileum in the RIGHT upper quadrant. Mild rectal thickening in the setting of marked rectal distension with mild perirectal stranding. The rectum measuring approximately 9 x 11 cm. No current pneumatosis. Vascular/Lymphatic: Marked calcific atheromatous plaque of the  abdominal aorta. No aneurysmal dilation. No adenopathy. No pelvic adenopathy. Calcific atheromatous plaque tracks into the pelvis, iliac vessels. Reproductive: Calcifications of the prostate, mass-effect upon the gland secondary to marked rectal distension. Prostate not well evaluated. Other: No free air.  No abscess. Musculoskeletal: Spinal degenerative changes. No acute or destructive bone finding. IMPRESSION: 1. Acute cholecystitis. Interval development of consolidative changes at the LEFT lung base and signs of bronchial wall thickening. Findings may represent pneumonia or aspiration. 2. Signs of potential aspiration superimposed on chronic infection. 3. Fecal impaction with signs of stercoral colitis. 4. Bilateral nephrolithiasis with resolution of hydronephrosis that was seen on the previous imaging study on the LEFT. 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 The large cystic area at the lower aspect of the LEFT kidney is likely related to markedly dilated caliceal elements. 5. Bilateral nephrolithiasis otherwise similar to the previous study. 6. **An incidental finding of potential clinical significance has been found. Partially calcified lesion arising from the medial, posterior hilar lip of the RIGHT kidney may represent a solid renal neoplasm. Could consider renal sonogram as an initial means of evaluation or follow-up MRI in 3-6 months. Findings unchanged compared to June 30th of 2021. ** 7. 4.6 cm ascending thoracic aorta. Calcified aortic valve. Calcified coronary artery disease. Ascending thoracic aortic aneurysm. Recommend semi-annual imaging followup by CTA or MRA and referral to cardiothoracic surgery if not already obtained as warranted. This recommendation follows 2010 ACCF/AHA/AATS/ACR/ASA/SCA/SCAI/SIR/STS/SVM Guidelines for the Diagnosis and Management of Patients With Thoracic Aortic Disease.  Circulation. 2010; 121: J696-V893. Aortic aneurysm NOS (ICD10-I71.9) 8. Dilated main pulmonary artery can be seen in the setting of pulmonary arterial hypertension. Aortic Atherosclerosis (ICD10-I70.0). Electronically Signed   By: Zetta Bills M.D.   On: 10/02/2019 15:48   DG Chest 1 View  Result Date: 10/09/2019 CLINICAL DATA:  Shortness of breath and cough. EXAM: CHEST  1 VIEW COMPARISON:  08/14/2019 radiograph and prior studies. FINDINGS: Mild bibasilar streaky/patchy opacities are noted which may represent atelectasis versus pneumonia. Cardiomediastinal silhouette is unremarkable. There is no evidence of pulmonary edema, suspicious pulmonary nodule/mass, pleural effusion, or pneumothorax. No acute bony abnormalities are identified. IMPRESSION: Mild bibasilar opacities which may represent atelectasis versus pneumonia. Electronically Signed   By: Margarette Canada M.D.   On: 10/04/2019 14:11   CT Chest Wo Contrast  Result Date: 09/21/2019 CLINICAL DATA:  Cough, persistent cough EXAM: CT CHEST, ABDOMEN AND PELVIS WITHOUT CONTRAST TECHNIQUE: Multidetector CT imaging of the chest, abdomen and pelvis was performed following the standard protocol without IV contrast. COMPARISON:  Chest evaluation from July of 2021, abdomen and pelvis from August 14, 2019 FINDINGS: CT CHEST FINDINGS Cardiovascular: Calcified atheromatous plaque of the thoracic aorta. Calcified aortic valve. Calcified coronary artery disease. Normal heart size. No pericardial effusion. Limited assessment of vascular structures in the chest due to lack of intravenous contrast. 4.6 cm ascending thoracic aorta. Central pulmonary arteries dilated to 4.2 cm. Mediastinum/Nodes: Thoracic inlet structures are normal. No axillary lymphadenopathy. No mediastinal lymphadenopathy. No gross hilar adenopathy. Assessment limited by lack of intravenous contrast. Esophagus normal. Lungs/Pleura: Patchy areas of tree-in-bud opacity and scattered nodules in the upper  lobes with similar appearance. Interval  development of similar process in the superior segment of the LEFT lower lobe but with consolidative changes at the LEFT lung base and signs of bronchial wall thickening that are new when compared to previous imaging. RIGHT lower lobe atelectasis. RIGHT lower lobe bronchial wall thickening.  Airways are patent. Musculoskeletal: No chest wall mass. See below for full musculoskeletal details. CT ABDOMEN PELVIS FINDINGS Hepatobiliary: No focal, suspicious hepatic lesion on noncontrast imaging. Marked pericholecystic stranding in the setting of cholelithiasis with distended gallbladder. Small amount of pericholecystic fluid. Pancreas: Pancreatic atrophy without peripancreatic stranding. Spleen: Spleen normal in size and contour. Adrenals/Urinary Tract: Adrenal glands are normal. Nephrolithiasis with large LEFT renal calculi. Resolution of hydronephrosis that was seen on the previous imaging study on the LEFT. Dilated lower pole collecting systems that appears to be a chronic finding similar to the prior study. RIGHT-sided nephrolithiasis. Intermediate density lesion arising from the medial upper pole of the RIGHT kidney associated with calcification measuring approximately 3.7 cm greatest dimension, similar size as measured by this observer on the prior study. Urinary bladder is normal. Stomach/Bowel: Stomach under distended. Mild stranding adjacent to the duodenum presumably related to adjacent acute cholecystitis. No acute small bowel process. Post RIGHT hemicolectomy with secondary thickening also of the ileum in the RIGHT upper quadrant. Mild rectal thickening in the setting of marked rectal distension with mild perirectal stranding. The rectum measuring approximately 9 x 11 cm. No current pneumatosis. Vascular/Lymphatic: Marked calcific atheromatous plaque of the abdominal aorta. No aneurysmal dilation. No adenopathy. No pelvic adenopathy. Calcific atheromatous plaque tracks  into the pelvis, iliac vessels. Reproductive: Calcifications of the prostate, mass-effect upon the gland secondary to marked rectal distension. Prostate not well evaluated. Other: No free air.  No abscess. Musculoskeletal: Spinal degenerative changes. No acute or destructive bone finding. IMPRESSION: 1. Acute cholecystitis. Interval development of consolidative changes at the LEFT lung base and signs of bronchial wall thickening. Findings may represent pneumonia or aspiration. 2. Signs of potential aspiration superimposed on chronic infection. 3. Fecal impaction with signs of stercoral colitis. 4. Bilateral nephrolithiasis with resolution of hydronephrosis that was seen on the previous imaging study on the LEFT. 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 The large cystic area at the lower aspect of the LEFT kidney is likely related to markedly dilated caliceal elements. 5. Bilateral nephrolithiasis otherwise similar to the previous study. 6. **An incidental finding of potential clinical significance has been found. Partially calcified lesion arising from the medial, posterior hilar lip of the RIGHT kidney may represent a solid renal neoplasm. Could consider renal sonogram as an initial means of evaluation or follow-up MRI in 3-6 months. Findings unchanged compared to June 30th of 2021. ** 7. 4.6 cm ascending thoracic aorta. Calcified aortic valve. Calcified coronary artery disease. Ascending thoracic aortic aneurysm. Recommend semi-annual imaging followup by CTA or MRA and referral to cardiothoracic surgery if not already obtained as warranted. This recommendation follows 2010 ACCF/AHA/AATS/ACR/ASA/SCA/SCAI/SIR/STS/SVM Guidelines for the Diagnosis and Management of Patients With Thoracic Aortic Disease. Circulation. 2010; 121: [id number]. Aortic aneurysm NOS (ICD10-I71.9) 8. Dilated main pulmonary artery can be seen in the  setting of pulmonary arterial hypertension. Aortic Atherosclerosis (ICD10-I70.0). Electronically Signed   By: Zetta Bills M.D.   On: 09/17/2019 15:48       ASSESSMENT AND PLAN SYNOPSIS  SEPSIS from acute Cholecystitis And aspiration pneumonia Responding to IVF's Continue IV abx Follow up Gen surgery recs DNR/DNI as per  patients wife-she is interested in hospice and palliate care services  ACUTE KIDNEY INJURY/Renal Failure -continue Foley Catheter-assess need -Avoid nephrotoxic agents -Follow urine output, BMP -Ensure adequate renal perfusion, optimize oxygenation -Renal dose medications PATIENTS WIFE IS NOT INTERESTED IN HEMODIALYSIS   Recommend [facility] admission  PCCM will follow up in consultation     Corrin Parker, M.D.  [facility]  Medical Director [place] Director [facility] Cardio-Pulmonary Department

## 2019-10-06 NOTE — H&P (Signed)
History and Physical    Shane Grant VHQ:[id number] DOB: 01-16-1932 DOA: 10/12/2019  Referring MD/NP/PA:Dr.Jessup.  PCP: Venia Carbon, MD    Patient coming from: [facility].  Chief Complaint:  Severe Sepsis   HPI: Shane Grant is a 86 y.o. male with medical history significant of DM/ HTN/ and Nephrolithiasis seen in er with wife ms Shane Grant at bedside . Pt is encephalopathic and nonverbal . He has declined since arrival and lactic level has gone up.Pt came in severe sepsis and responded to  LR bolus 3L. Pt has received emperic abx and hpi is per wife. Pt was living at [facility] to received PT and since Friday has been declining with agitation at facility. Pt was also recently seen by cardiology on the 20th for atrial fibrillation.   ED Course:  In ed pt initially was tachycardic/ afebrile/ resp was 25 and source of infection was pna/ cholecystitis/ and UTI. Pt meets severe sepsis he has acute organ dysfunction with renal failure and also hypotension with elevated lactic.Pt's hypotension has responded to IVF resuscitation. And bp is 413 systolic and d/w wife and son Shane Grant at bedside. After reviewing his case and chart in detail with son and wife family wants pt to be treated and made comfortable , do not want pressors and life prolonging measures.   Review of Systems: Unable to obtain pt is encephalopathic.  Past Medical History:  Diagnosis Date  . Colon cancer ([place]) 2003   Partial colectomy for cancer in polyp  . Diabetes mellitus   . Hyperlipidemia   . Hypertension ~1997  . Kidney stones 2000   lithotripsy  . Urge incontinence of urine     Past Surgical History:  Procedure Laterality Date  . APPENDECTOMY  2003   with colectomy  . CATARACT EXTRACTION W/ INTRAOCULAR LENS  IMPLANT, BILATERAL Bilateral 11/14, 12/14   Dr George Ina  . CYSTOSCOPY W/ RETROGRADES Left 08/15/2019   Procedure: CYSTOSCOPY WITH RETROGRADE PYELOGRAM;  Surgeon: Billey Co, MD;  Location:  [facility] ORS;  Service: Urology;  Laterality: Left;  . CYSTOSCOPY WITH LITHOLAPAXY  08/15/2019   Procedure: CYSTOSCOPY WITH LITHOLAPAXY;  Surgeon: Billey Co, MD;  Location: [facility] ORS;  Service: Urology;;  . Consuela Mimes WITH STENT PLACEMENT Left 08/15/2019   Procedure: CYSTOSCOPY WITH STENT PLACEMENT;  Surgeon: Billey Co, MD;  Location: [facility] ORS;  Service: Urology;  Laterality: Left;  . CYSTOSCOPY/URETEROSCOPY/HOLMIUM LASER/STENT PLACEMENT Left 09/20/2019   Procedure: CYSTOSCOPY/URETEROSCOPY/HOLMIUM LASER/STENT PLACEMENT;  Surgeon: Billey Co, MD;  Location: [facility] ORS;  Service: Urology;  Laterality: Left;  . Hepatic lesion  2011   Cryoablation by radiologist  . LITHOTRIPSY  2000  . PARTIAL COLECTOMY  2003   ascending colon  . TONSILLECTOMY AND ADENOIDECTOMY  1947     reports that he has quit smoking. His smoking use included cigars. He has never used smokeless tobacco. He reports current alcohol use. He reports that he does not use drugs.  Allergies  Allergen Reactions  . Invokana [Canagliflozin]     Raised sugars and he felt bad  . Ancef [Cefazolin Sodium] Nausea Only    Family History  Problem Relation Age of Onset  . Cancer Brother        lung  . Heart disease Neg Hx   . Hypertension Neg Hx   . Diabetes Neg Hx      Prior to Admission medications   Medication Sig Start Date End Date Taking? Authorizing Provider  aspirin 81 MG chewable tablet Chew  81 mg by mouth daily.    [provider]  atorvastatin (LIPITOR) 80 MG tablet Take 1 tablet (80 mg total) by mouth daily. 03/22/19   Venia Carbon, MD  bismuth subsalicylate (PEPTO BISMOL) 262 MG/15ML suspension Take 30 mLs by mouth every 4 (four) hours as needed for diarrhea or loose stools.    [provider]  glipiZIDE (GLUCOTROL) 5 MG tablet Take 1 tablet (5 mg total) by mouth 2 (two) times daily before a meal. 04/02/19   Venia Carbon, MD  Insulin Glargine (LANTUS SOLOSTAR) 100 UNIT/ML Solostar Pen  INJECT 10 UNITS SUBCUTANEOUSLY ONCE DAILY 12/05/18   Venia Carbon, MD  metFORMIN (GLUCOPHAGE) 1000 MG tablet Take 1 tablet (1,000 mg total) by mouth 2 (two) times daily with a meal. 03/18/19   Venia Carbon, MD  mirtazapine (REMERON) 7.5 MG tablet Take 7.5 mg by mouth at bedtime.    [provider]    Physical Exam: Vitals:   09/20/2019 1424 10/05/2019 1435 09/24/2019 1455 09/17/2019 1601  BP:  106/63    Pulse: (!) 120   (!) 115  Resp: (!) 25   (!) 25  Temp:   97.8 F (36.6 C)   TempSrc:   Oral   SpO2:    95%  Weight:      Height:          Constitutional: NAD, calm, comfortable Vitals:   09/22/2019 1424 10/09/2019 1435 09/26/2019 1455 10/15/2019 1601  BP:  106/63    Pulse: (!) 120   (!) 115  Resp: (!) 25   (!) 25  Temp:   97.8 F (36.6 C)   TempSrc:   Oral   SpO2:    95%  Weight:      Height:       Eyes: EOMI ENMT: Mucous membranes are dry.  Respiratory: BL rales and   Cardiovascular: Regular rate and rhythm, no murmurs / rubs / gallops. No extremity edema. 2+ pedal pulses. No carotid bruits.  Abdomen: no masses palpated. No hepatosplenomegaly. Bowel sounds Hypoactive. Musculoskeletal: Deferred.  Skin: no rashes, lesions, ulcers. No induration Neurologic: CN 2-12 grossly intact. Sensation intact, DTR normal. Strength 5/5 in all 4.  Psychiatric: Normal judgment and insight. Alert and oriented x 3. Normal mood.   Labs on Admission: I have personally reviewed following labs and imaging studies  CBC: Recent Labs  Lab 10/05/2019 1355  WBC 39.8*  NEUTROABS 35.2*  HGB 9.6*  HCT 30.8*  MCV 94.2  PLT 469*   Basic Metabolic Panel: Recent Labs  Lab 10/07/2019 1355  NA 140  K 4.9  CL 105  CO2 18*  GLUCOSE 60*  BUN 81*  CREATININE 4.08*  CALCIUM 9.1  MG 1.8   GFR: Estimated Creatinine Clearance: 17.1 mL/min (A) (by C-G formula based on SCr of 4.08 mg/dL (H)). Liver Function Tests: Recent Labs  Lab 10/14/2019 1355  AST 59*  ALT 37  ALKPHOS 167*  BILITOT 1.0   PROT 7.6  ALBUMIN 2.3*   Recent Labs  Lab 10/12/2019 1355  LIPASE 36   No results for input(s): AMMONIA in the last 168 hours. Coagulation Profile: Recent Labs  Lab 09/26/2019 1637  INR 1.4*   Cardiac Enzymes: No results for input(s): CKTOTAL, CKMB, CKMBINDEX, TROPONINI in the last 168 hours. BNP (last 3 results) No results for input(s): PROBNP in the last 8760 hours. HbA1C: No results for input(s): HGBA1C in the last 72 hours. CBG: Recent Labs  Lab 10/08/2019 1641  GLUCAP  266*   Urine analysis:    Component Value Date/Time   COLORURINE YELLOW 08/27/2019 1125   APPEARANCEUR HAZY (A) 08/27/2019 1125   LABSPEC 1.020 08/27/2019 1125   PHURINE 5.5 08/27/2019 1125   GLUCOSEU NEGATIVE 08/27/2019 1125   HGBUR MODERATE (A) 08/27/2019 1125   BILIRUBINUR NEGATIVE 08/27/2019 1125   KETONESUR NEGATIVE 08/27/2019 1125   PROTEINUR 100 (A) 08/27/2019 1125   NITRITE NEGATIVE 08/27/2019 1125   LEUKOCYTESUR SMALL (A) 08/27/2019 1125    Recent Results (from the past 240 hour(s))  SARS Coronavirus 2 by RT PCR (hospital order, performed in [facility] hospital lab) Nasopharyngeal Nasopharyngeal Swab     Status: None   Collection Time: 09/20/2019  1:55 PM   Specimen: Nasopharyngeal Swab  Result Value Ref Range Status   SARS Coronavirus 2 NEGATIVE NEGATIVE Final    Comment: (NOTE) SARS-CoV-2 target nucleic acids are NOT DETECTED.  The SARS-CoV-2 RNA is generally detectable in upper and lower respiratory specimens during the acute phase of infection. The lowest concentration of SARS-CoV-2 viral copies this assay can detect is 250 copies / mL. A negative result does not preclude SARS-CoV-2 infection and should not be used as the sole basis for treatment or other patient management decisions.  A negative result may occur with improper specimen collection / handling, submission of specimen other than nasopharyngeal swab, presence of viral mutation(s) within the areas targeted by this  assay, and inadequate number of viral copies (<250 copies / mL). A negative result must be combined with clinical observations, patient history, and epidemiological information.  Fact Sheet for Patients:   StrictlyIdeas.no  Fact Sheet for Healthcare Providers: BankingDealers.co.za  This test is not yet approved or  cleared by the [place] FDA and has been authorized for detection and/or diagnosis of SARS-CoV-2 by FDA under an Emergency Use Authorization (EUA).  This EUA will remain in effect (meaning this test can be used) for the duration of the COVID-19 declaration under Section 564(b)(1) of the Act, 21 U.S.C. section 360bbb-3(b)(1), unless the authorization is terminated or revoked sooner.  Performed at [facility], [address] [place], [place] [address]      Radiological Exams on Admission: CT ABDOMEN PELVIS WO CONTRAST  Result Date: 10/01/2019 CLINICAL DATA:  Cough, persistent cough EXAM: CT CHEST, ABDOMEN AND PELVIS WITHOUT CONTRAST TECHNIQUE: Multidetector CT imaging of the chest, abdomen and pelvis was performed following the standard protocol without IV contrast. COMPARISON:  Chest evaluation from July of 2021, abdomen and pelvis from August 14, 2019 FINDINGS: CT CHEST FINDINGS Cardiovascular: Calcified atheromatous plaque of the thoracic aorta. Calcified aortic valve. Calcified coronary artery disease. Normal heart size. No pericardial effusion. Limited assessment of vascular structures in the chest due to lack of intravenous contrast. 4.6 cm ascending thoracic aorta. Central pulmonary arteries dilated to 4.2 cm. Mediastinum/Nodes: Thoracic inlet structures are normal. No axillary lymphadenopathy. No mediastinal lymphadenopathy. No gross hilar adenopathy. Assessment limited by lack of intravenous contrast. Esophagus normal. Lungs/Pleura: Patchy areas of tree-in-bud opacity and scattered nodules in the upper lobes with  similar appearance. Interval development of similar process in the superior segment of the LEFT lower lobe but with consolidative changes at the LEFT lung base and signs of bronchial wall thickening that are new when compared to previous imaging. RIGHT lower lobe atelectasis. RIGHT lower lobe bronchial wall thickening.  Airways are patent. Musculoskeletal: No chest wall mass. See below for full musculoskeletal details. CT ABDOMEN PELVIS FINDINGS Hepatobiliary: No focal, suspicious hepatic lesion on noncontrast  imaging. Marked pericholecystic stranding in the setting of cholelithiasis with distended gallbladder. Small amount of pericholecystic fluid. Pancreas: Pancreatic atrophy without peripancreatic stranding. Spleen: Spleen normal in size and contour. Adrenals/Urinary Tract: Adrenal glands are normal. Nephrolithiasis with large LEFT renal calculi. Resolution of hydronephrosis that was seen on the previous imaging study on the LEFT. Dilated lower pole collecting systems that appears to be a chronic finding similar to the prior study. RIGHT-sided nephrolithiasis. Intermediate density lesion arising from the medial upper pole of the RIGHT kidney associated with calcification measuring approximately 3.7 cm greatest dimension, similar size as measured by this observer on the prior study. Urinary bladder is normal. Stomach/Bowel: Stomach under distended. Mild stranding adjacent to the duodenum presumably related to adjacent acute cholecystitis. No acute small bowel process. Post RIGHT hemicolectomy with secondary thickening also of the ileum in the RIGHT upper quadrant. Mild rectal thickening in the setting of marked rectal distension with mild perirectal stranding. The rectum measuring approximately 9 x 11 cm. No current pneumatosis. Vascular/Lymphatic: Marked calcific atheromatous plaque of the abdominal aorta. No aneurysmal dilation. No adenopathy. No pelvic adenopathy. Calcific atheromatous plaque tracks into the  pelvis, iliac vessels. Reproductive: Calcifications of the prostate, mass-effect upon the gland secondary to marked rectal distension. Prostate not well evaluated. Other: No free air.  No abscess. Musculoskeletal: Spinal degenerative changes. No acute or destructive bone finding. IMPRESSION: 1. Acute cholecystitis. Interval development of consolidative changes at the LEFT lung base and signs of bronchial wall thickening. Findings may represent pneumonia or aspiration. 2. Signs of potential aspiration superimposed on chronic infection. 3. Fecal impaction with signs of stercoral colitis. 4. Bilateral nephrolithiasis with resolution of hydronephrosis that was seen on the previous imaging study on the LEFT. 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 The large cystic area at the lower aspect of the LEFT kidney is likely related to markedly dilated caliceal elements. 5. Bilateral nephrolithiasis otherwise similar to the previous study. 6. **An incidental finding of potential clinical significance has been found. Partially calcified lesion arising from the medial, posterior hilar lip of the RIGHT kidney may represent a solid renal neoplasm. Could consider renal sonogram as an initial means of evaluation or follow-up MRI in 3-6 months. Findings unchanged compared to June 30th of 2021. ** 7. 4.6 cm ascending thoracic aorta. Calcified aortic valve. Calcified coronary artery disease. Ascending thoracic aortic aneurysm. Recommend semi-annual imaging followup by CTA or MRA and referral to cardiothoracic surgery if not already obtained as warranted. This recommendation follows 2010 ACCF/AHA/AATS/ACR/ASA/SCA/SCAI/SIR/STS/SVM Guidelines for the Diagnosis and Management of Patients With Thoracic Aortic Disease. Circulation. 2010; 121: R518-A416. Aortic aneurysm NOS (ICD10-I71.9) 8. Dilated main pulmonary artery can be seen in the setting  of pulmonary arterial hypertension. Aortic Atherosclerosis (ICD10-I70.0). Electronically Signed   By: Zetta Bills M.D.   On: 09/21/2019 15:48   DG Chest 1 View  Result Date: 09/29/2019 CLINICAL DATA:  Shortness of breath and cough. EXAM: CHEST  1 VIEW COMPARISON:  08/14/2019 radiograph and prior studies. FINDINGS: Mild bibasilar streaky/patchy opacities are noted which may represent atelectasis versus pneumonia. Cardiomediastinal silhouette is unremarkable. There is no evidence of pulmonary edema, suspicious pulmonary nodule/mass, pleural effusion, or pneumothorax. No acute bony abnormalities are identified. IMPRESSION: Mild bibasilar opacities which may represent atelectasis versus pneumonia. Electronically Signed   By: Margarette Canada M.D.   On: 10/03/2019 14:11   CT Chest Wo Contrast  Result Date: 10/03/2019 CLINICAL DATA:  Cough,  persistent cough EXAM: CT CHEST, ABDOMEN AND PELVIS WITHOUT CONTRAST TECHNIQUE: Multidetector CT imaging of the chest, abdomen and pelvis was performed following the standard protocol without IV contrast. COMPARISON:  Chest evaluation from July of 2021, abdomen and pelvis from August 14, 2019 FINDINGS: CT CHEST FINDINGS Cardiovascular: Calcified atheromatous plaque of the thoracic aorta. Calcified aortic valve. Calcified coronary artery disease. Normal heart size. No pericardial effusion. Limited assessment of vascular structures in the chest due to lack of intravenous contrast. 4.6 cm ascending thoracic aorta. Central pulmonary arteries dilated to 4.2 cm. Mediastinum/Nodes: Thoracic inlet structures are normal. No axillary lymphadenopathy. No mediastinal lymphadenopathy. No gross hilar adenopathy. Assessment limited by lack of intravenous contrast. Esophagus normal. Lungs/Pleura: Patchy areas of tree-in-bud opacity and scattered nodules in the upper lobes with similar appearance. Interval development of similar process in the superior segment of the LEFT lower lobe but with  consolidative changes at the LEFT lung base and signs of bronchial wall thickening that are new when compared to previous imaging. RIGHT lower lobe atelectasis. RIGHT lower lobe bronchial wall thickening.  Airways are patent. Musculoskeletal: No chest wall mass. See below for full musculoskeletal details. CT ABDOMEN PELVIS FINDINGS Hepatobiliary: No focal, suspicious hepatic lesion on noncontrast imaging. Marked pericholecystic stranding in the setting of cholelithiasis with distended gallbladder. Small amount of pericholecystic fluid. Pancreas: Pancreatic atrophy without peripancreatic stranding. Spleen: Spleen normal in size and contour. Adrenals/Urinary Tract: Adrenal glands are normal. Nephrolithiasis with large LEFT renal calculi. Resolution of hydronephrosis that was seen on the previous imaging study on the LEFT. Dilated lower pole collecting systems that appears to be a chronic finding similar to the prior study. RIGHT-sided nephrolithiasis. Intermediate density lesion arising from the medial upper pole of the RIGHT kidney associated with calcification measuring approximately 3.7 cm greatest dimension, similar size as measured by this observer on the prior study. Urinary bladder is normal. Stomach/Bowel: Stomach under distended. Mild stranding adjacent to the duodenum presumably related to adjacent acute cholecystitis. No acute small bowel process. Post RIGHT hemicolectomy with secondary thickening also of the ileum in the RIGHT upper quadrant. Mild rectal thickening in the setting of marked rectal distension with mild perirectal stranding. The rectum measuring approximately 9 x 11 cm. No current pneumatosis. Vascular/Lymphatic: Marked calcific atheromatous plaque of the abdominal aorta. No aneurysmal dilation. No adenopathy. No pelvic adenopathy. Calcific atheromatous plaque tracks into the pelvis, iliac vessels. Reproductive: Calcifications of the prostate, mass-effect upon the gland secondary to marked  rectal distension. Prostate not well evaluated. Other: No free air.  No abscess. Musculoskeletal: Spinal degenerative changes. No acute or destructive bone finding. IMPRESSION: 1. Acute cholecystitis. Interval development of consolidative changes at the LEFT lung base and signs of bronchial wall thickening. Findings may represent pneumonia or aspiration. 2. Signs of potential aspiration superimposed on chronic infection. 3. Fecal impaction with signs of stercoral colitis. 4. Bilateral nephrolithiasis with resolution of hydronephrosis that was seen on the previous imaging study on the LEFT. 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 The large cystic area at the lower aspect of the LEFT kidney is likely related to markedly dilated caliceal elements. 5. Bilateral nephrolithiasis otherwise similar to the previous study. 6. **An incidental finding of potential clinical significance has been found. Partially calcified lesion arising from the medial, posterior hilar lip of the RIGHT kidney may represent a solid renal neoplasm. Could consider renal sonogram as an initial means of evaluation or follow-up MRI  in 3-6 months. Findings unchanged compared to June 30th of 2021. ** 7. 4.6 cm ascending thoracic aorta. Calcified aortic valve. Calcified coronary artery disease. Ascending thoracic aortic aneurysm. Recommend semi-annual imaging followup by CTA or MRA and referral to cardiothoracic surgery if not already obtained as warranted. This recommendation follows 2010 ACCF/AHA/AATS/ACR/ASA/SCA/SCAI/SIR/STS/SVM Guidelines for the Diagnosis and Management of Patients With Thoracic Aortic Disease. Circulation. 2010; 121: J696-V893. Aortic aneurysm NOS (ICD10-I71.9) 8. Dilated main pulmonary artery can be seen in the setting of pulmonary arterial hypertension. Aortic Atherosclerosis (ICD10-I70.0). Electronically Signed   By: Zetta Bills  M.D.   On: 09/20/2019 15:48    Assessment/Plan Principal Problem:  Severe sepsis : -Pt is s/p 3 L LR and currently at 150 cc/hour , he also has elevated bnp and sounds volume overloaded and hypotension has corrected we will cut down rate to 27ml per hour unless hypotensive. -no pressors per family, ad,it to med surg. -cont iv abx with vancomycin/flagyl and cefepime.  -Attribute to UTI/ cholecystitis and PNA as course.  -Prognosis is guarded .    Hypertension: -hold home and any po meds.  DM II: accuchecks q 4h. If family agrees.    Kidney stones: S/p lithotripsy in past and stent this year.  Pt was being treated for infection and seemed to be improving.  But since Friday has gotten worse.    DVT prophylaxis: SCD held for comfort and inr is mildly elevated.  Code Status: DNR. Family Communication:  Shane Grant wife; [phone]. Shane Grant son : [phone]. Shane Grant Son : [phone] Disposition Plan: Observation. Consults called: CCM: Dr. Mortimer Fries. Gen surg: Dr. Dahlia Byes.   Admission status: Observation.   Para Skeans MD [facility] Pager [phone] If 7PM-7AM, please contact night-coverage [email or web address] Password [facility] 09/16/2019, 6:51 PM

## 2019-10-06 NOTE — Progress Notes (Signed)
86 yo male septic from presumed cholecystitis w aspiration pneumonia and MOF. Needs resuscitation and IV A/Bs. Depending on course may either need cholecystostomy tube or might deteriorate to the point of requiring comfort measures. Full consult to follow.

## 2019-10-06 NOTE — Consult Note (Signed)
Pharmacy Antibiotic Note  Shane Grant is a 86 y.o. male admitted on 09/21/2019 with sepsis. Per notes in chart, patient presented via EMS from a nursing facility for assessment of shortness of breath associated with abdominal pain, hypoglycemia, and borderline low blood pressures per EMS who spoke with facility staff.  Per notes, patient's wife is not interested in hemodialysis. Pharmacy has been consulted for Vancomycin, Cefepime, Metronidazole dosing.  Vancomycin 1g x1, Cefepime 2g x1, and Metronidazole 500 mg x1 given @ 5956 today  D/t patient's weight would consider a Vancomycin loading dose of 2250 mg total, but given AKI and refusal of dialysis will likely give a loading dose of 2g.   Plan: Cefepime 2g Q24H   Metronidazole 500 mg Q8H   Vancomycin 1000 mg x1 for a total loading dose of 2g. Will check Scr with AM labs.    Height: 6\' 6"  (198.1 cm) Weight: 94.1 kg (207 lb 8 oz) IBW/kg (Calculated) : 91.4  Temp (24hrs), Avg:97.8 F (36.6 C), Min:97.8 F (36.6 C), Max:97.8 F (36.6 C)  Recent Labs  Lab 09/27/2019 1355 10/09/2019 1637  WBC 39.8*  --   CREATININE 4.08*  --   LATICACIDVEN 4.5* 6.9*    Estimated Creatinine Clearance: 17.1 mL/min (A) (by C-G formula based on SCr of 4.08 mg/dL (H)).    Allergies  Allergen Reactions  . Invokana [Canagliflozin]     Raised sugars and he felt bad  . Ancef [Cefazolin Sodium] Nausea Only    Antimicrobials this admission: 09/27/2019 vancomycin/ cefepime/metronidazole >>>  Dose adjustments this admission:   Microbiology results:  BCx:   UCx:  Sputum:    MRSA PCR:   Thank you for allowing pharmacy to be a part of this patient's care.  Rowland Lathe 09/19/2019 6:18 PM

## 2019-10-06 NOTE — Progress Notes (Signed)
Notified bedside nurse of need to administer fluid bolus.  Message sent to bedside RN to ensure patient received all ordered fluid bolus since patient meets criteria with his elevated lactic levels. Bedside RN response she will start the last liter very soon.

## 2019-10-06 NOTE — ED Provider Notes (Signed)
[facility] Emergency Department Provider Note  ____________________________________________   First MD Initiated Contact with Patient 09/21/2019 1340     (approximate)  I have reviewed the triage vital signs and the nursing notes.   HISTORY  Chief Complaint Hypoglycemia   HPI Samuel Mcpeek is a 86 y.o. male  with past medical history of Kidney stones/obstructive uropathy, Chronic kidney disease, Type 2 diabetes, 5 cm ascending aorta aneurysm on CT scan July 2021 and paroxysmal A. fibn who presents via EMS from a nursing facility for assessment of shortness of breath associated with abdominal pain, hypoglycemia, and borderline low blood pressures per EMS who spoke with facility staff.  Patient is only oriented to year and is unable to write any other additional significant history other than stating he feels bloated.  I did attempt to reach patient's facility by calling phone number listed on Internet but no one answered the phone.  Other history is immediately available on patient arrival.      Past Medical History:  Diagnosis Date   Colon cancer ([place]) 2003   Partial colectomy for cancer in polyp   Diabetes mellitus    Hyperlipidemia    Hypertension ~1997   Kidney stones 2000   lithotripsy   Urge incontinence of urine     Patient Active Problem List   Diagnosis Date Noted   Ascending aorta dilatation ([place]) 10/03/2019   Generalized weakness 08/16/2019   Right leg pain 08/16/2019   Obstructive uropathy 08/15/2019   BPH with obstruction/lower urinary tract symptoms 03/01/2019   Leg ulcer ([place]) 03/01/2019   Orthostatic dizziness 08/22/2018   Aortic stenosis 06/04/2014   Advanced directives, counseling/discussion 12/04/2013   Routine general medical examination at a health care facility 10/03/2011   Hypertension    Hyperlipidemia    Type 2 diabetes, controlled, with neuropathy ([place])    History of colon cancer    Kidney  stones     Past Surgical History:  Procedure Laterality Date   APPENDECTOMY  2003   with colectomy   CATARACT EXTRACTION W/ INTRAOCULAR LENS  IMPLANT, BILATERAL Bilateral 11/14, 12/14   Dr George Ina   CYSTOSCOPY W/ RETROGRADES Left 08/15/2019   Procedure: CYSTOSCOPY WITH RETROGRADE PYELOGRAM;  Surgeon: Billey Co, MD;  Location: [facility] ORS;  Service: Urology;  Laterality: Left;   CYSTOSCOPY WITH LITHOLAPAXY  08/15/2019   Procedure: CYSTOSCOPY WITH LITHOLAPAXY;  Surgeon: Billey Co, MD;  Location: [facility] OR[facility];  Service: Urology;;   CYSTOSCOPY WITH STENT PLACEMENT Left 08/15/2019   Procedure: CYSTOSCOPY WITH STENT PLACEMENT;  Surgeon: Billey Co, MD;  Location: [facility] OR[facility];  Service: Urology;  Laterality: Left;   CYSTOSCOPY/URETEROSCOPY/HOLMIUM LASER/STENT PLACEMENT Left 09/20/2019   Procedure: CYSTOSCOPY/URETEROSCOPY/HOLMIUM LASER/STENT PLACEMENT;  Surgeon: Billey Co, MD;  Location: [facility] OR[facility];  Service: Urology;  Laterality: Left;   Hepatic lesion  2011   Cryoablation by radiologist   LITHOTRIPSY  2000   PARTIAL COLECTOMY  2003   ascending colon   TONSILLECTOMY AND ADENOIDECTOMY  1947    Prior to Admission medications   Medication Sig Start Date End Date Taking? Authorizing Provider  aspirin 81 MG chewable tablet Chew 81 mg by mouth daily.    [provider]  atorvastatin (LIPITOR) 80 MG tablet Take 1 tablet (80 mg total) by mouth daily. 03/22/19   Venia Carbon, MD  bismuth subsalicylate (PEPTO BISMOL) 262 MG/15ML suspension Take 30 mLs by mouth every 4 (four) hours as needed for diarrhea or loose stools.  [provider]  glipiZIDE (GLUCOTROL) 5 MG tablet Take 1 tablet (5 mg total) by mouth 2 (two) times daily before a meal. 04/02/19   Venia Carbon, MD  Insulin Glargine (LANTUS SOLOSTAR) 100 UNIT/ML Solostar Pen INJECT 10 UNITS SUBCUTANEOUSLY ONCE DAILY 12/05/18   Venia Carbon, MD  metFORMIN (GLUCOPHAGE) 1000 MG tablet Take 1 tablet  (1,000 mg total) by mouth 2 (two) times daily with a meal. 03/18/19   Venia Carbon, MD  mirtazapine (REMERON) 7.5 MG tablet Take 7.5 mg by mouth at bedtime.    [provider]    Allergies Invokana [canagliflozin] and Ancef [cefazolin sodium]  Family History  Problem Relation Age of Onset   Cancer Brother        lung   Heart disease Neg Hx    Hypertension Neg Hx    Diabetes Neg Hx     Social History Social History   Tobacco Use   Smoking status: Former Smoker    Types: Cigars   Smokeless tobacco: Never Used  Substance Use Topics   Alcohol use: Yes    Comment: Glass of wine or scotch before dinner   Drug use: No    Review of Systems  Review of Systems  Unable to perform ROS: Mental status change  Respiratory: Positive for cough and shortness of breath.   Gastrointestinal: Positive for abdominal pain.      ____________________________________________   PHYSICAL EXAM:  VITAL SIGNS: ED Triage Vitals  Enc Vitals Group     BP      Pulse      Resp      Temp      Temp src      SpO2      Weight      Height      Head Circumference      Peak Flow      Pain Score      Pain Loc      Pain Edu?      Excl. in [place]?    Vitals:   09/23/2019 1455 09/24/2019 1601  BP:    Pulse:  (!) 115  Resp:  (!) 25  Temp: 97.8 F (36.6 C)   SpO2:  95%   Physical Exam Vitals and nursing note reviewed.  Constitutional:      General: He is in acute distress.  HENT:     Head: Normocephalic and atraumatic.     Right Ear: External ear normal.     Left Ear: External ear normal.     Nose: Nose normal.     Mouth/Throat:     Mouth: Mucous membranes are dry.  Eyes:     Extraocular Movements: Extraocular movements intact.     Conjunctiva/sclera: Conjunctivae normal.     Pupils: Pupils are equal, round, and reactive to light.  Cardiovascular:     Rate and Rhythm: Tachycardia present. Rhythm irregular.  Pulmonary:     Breath sounds: Examination of the  right-middle field reveals rhonchi. Examination of the left-middle field reveals rhonchi. Examination of the right-lower field reveals rhonchi. Examination of the left-lower field reveals rhonchi. Rhonchi present.  Abdominal:     Tenderness: There is abdominal tenderness. There is no right CVA tenderness or left CVA tenderness.  Musculoskeletal:     Right lower leg: No edema.     Left lower leg: No edema.  Skin:    General: Skin is warm.     Capillary Refill: Capillary refill takes 2 to 3  seconds.  Neurological:     Mental Status: He is disoriented and confused.     PERRLA.  EOMI.  Patient is able to give this examiner thumbs up with both hands and move both feet on command. ____________________________________________   LABS (all labs ordered are listed, but only abnormal results are displayed)  Labs Reviewed  CBC WITH DIFFERENTIAL/PLATELET - Abnormal; Notable for the following components:      Result Value   WBC 39.8 (*)    RBC 3.27 (*)    Hemoglobin 9.6 (*)    HCT 30.8 (*)    Platelets 479 (*)    Neutro Abs 35.2 (*)    Monocytes Absolute 2.6 (*)    Abs Immature Granulocytes 0.46 (*)    All other components within normal limits  COMPREHENSIVE METABOLIC PANEL - Abnormal; Notable for the following components:   CO2 18 (*)    Glucose, Bld 60 (*)    BUN 81 (*)    Creatinine, Ser 4.08 (*)    Albumin 2.3 (*)    AST 59 (*)    Alkaline Phosphatase 167 (*)    GFR calc non Af Amer 12 (*)    GFR calc Af Amer 14 (*)    Anion gap 17 (*)    All other components within normal limits  BRAIN NATRIURETIC PEPTIDE - Abnormal; Notable for the following components:   B Natriuretic Peptide 707.2 (*)    All other components within normal limits  LACTIC ACID, PLASMA - Abnormal; Notable for the following components:   Lactic Acid, Venous 4.5 (*)    All other components within normal limits  BLOOD GAS, VENOUS - Abnormal; Notable for the following components:   pCO2, Ven 38 (*)    pO2, Ven  <31.0 (*)    Acid-base deficit 4.2 (*)    All other components within normal limits  TROPONIN I (HIGH SENSITIVITY) - Abnormal; Notable for the following components:   Troponin I (High Sensitivity) 380 (*)    All other components within normal limits  SARS CORONAVIRUS 2 BY RT PCR (HOSPITAL ORDER, [place] [facility])  URINE CULTURE  CULTURE, BLOOD (ROUTINE X 2)  CULTURE, BLOOD (ROUTINE X 2)  PROCALCITONIN  MAGNESIUM  LIPASE, BLOOD  LACTIC ACID, PLASMA  URINALYSIS, COMPLETE (UACMP) WITH MICROSCOPIC  PROTIME-INR  APTT  CBG MONITORING, ED  CBG MONITORING, ED  CBG MONITORING, ED  CBG MONITORING, ED   ____________________________________________  EKG  A. fib with RVR with a rate of 121, normal axis, unremarkable intervals, diffuse nonspecific T wave changes, and no other clear evidence of acute ischemia. ____________________________________________  RADIOLOGY   Official radiology report(s): CT ABDOMEN PELVIS WO CONTRAST  Result Date: 10/03/2019 CLINICAL DATA:  Cough, persistent cough EXAM: CT CHEST, ABDOMEN AND PELVIS WITHOUT CONTRAST TECHNIQUE: Multidetector CT imaging of the chest, abdomen and pelvis was performed following the standard protocol without IV contrast. COMPARISON:  Chest evaluation from July of 2021, abdomen and pelvis from August 14, 2019 FINDINGS: CT CHEST FINDINGS Cardiovascular: Calcified atheromatous plaque of the thoracic aorta. Calcified aortic valve. Calcified coronary artery disease. Normal heart size. No pericardial effusion. Limited assessment of vascular structures in the chest due to lack of intravenous contrast. 4.6 cm ascending thoracic aorta. Central pulmonary arteries dilated to 4.2 cm. Mediastinum/Nodes: Thoracic inlet structures are normal. No axillary lymphadenopathy. No mediastinal lymphadenopathy. No gross hilar adenopathy. Assessment limited by lack of intravenous contrast. Esophagus normal. Lungs/Pleura: Patchy areas of tree-in-bud  opacity and scattered nodules  in the upper lobes with similar appearance. Interval development of similar process in the superior segment of the LEFT lower lobe but with consolidative changes at the LEFT lung base and signs of bronchial wall thickening that are new when compared to previous imaging. RIGHT lower lobe atelectasis. RIGHT lower lobe bronchial wall thickening.  Airways are patent. Musculoskeletal: No chest wall mass. See below for full musculoskeletal details. CT ABDOMEN PELVIS FINDINGS Hepatobiliary: No focal, suspicious hepatic lesion on noncontrast imaging. Marked pericholecystic stranding in the setting of cholelithiasis with distended gallbladder. Small amount of pericholecystic fluid. Pancreas: Pancreatic atrophy without peripancreatic stranding. Spleen: Spleen normal in size and contour. Adrenals/Urinary Tract: Adrenal glands are normal. Nephrolithiasis with large LEFT renal calculi. Resolution of hydronephrosis that was seen on the previous imaging study on the LEFT. Dilated lower pole collecting systems that appears to be a chronic finding similar to the prior study. RIGHT-sided nephrolithiasis. Intermediate density lesion arising from the medial upper pole of the RIGHT kidney associated with calcification measuring approximately 3.7 cm greatest dimension, similar size as measured by this observer on the prior study. Urinary bladder is normal. Stomach/Bowel: Stomach under distended. Mild stranding adjacent to the duodenum presumably related to adjacent acute cholecystitis. No acute small bowel process. Post RIGHT hemicolectomy with secondary thickening also of the ileum in the RIGHT upper quadrant. Mild rectal thickening in the setting of marked rectal distension with mild perirectal stranding. The rectum measuring approximately 9 x 11 cm. No current pneumatosis. Vascular/Lymphatic: Marked calcific atheromatous plaque of the abdominal aorta. No aneurysmal dilation. No adenopathy. No pelvic  adenopathy. Calcific atheromatous plaque tracks into the pelvis, iliac vessels. Reproductive: Calcifications of the prostate, mass-effect upon the gland secondary to marked rectal distension. Prostate not well evaluated. Other: No free air.  No abscess. Musculoskeletal: Spinal degenerative changes. No acute or destructive bone finding. IMPRESSION: 1. Acute cholecystitis. Interval development of consolidative changes at the LEFT lung base and signs of bronchial wall thickening. Findings may represent pneumonia or aspiration. 2. Signs of potential aspiration superimposed on chronic infection. 3. Fecal impaction with signs of stercoral colitis. 4. Bilateral nephrolithiasis with resolution of hydronephrosis that was seen on the previous imaging study on the LEFT. 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 The large cystic area at the lower aspect of the LEFT kidney is likely related to markedly dilated caliceal elements. 5. Bilateral nephrolithiasis otherwise similar to the previous study. 6. **An incidental finding of potential clinical significance has been found. Partially calcified lesion arising from the medial, posterior hilar lip of the RIGHT kidney may represent a solid renal neoplasm. Could consider renal sonogram as an initial means of evaluation or follow-up MRI in 3-6 months. Findings unchanged compared to June 30th of 2021. ** 7. 4.6 cm ascending thoracic aorta. Calcified aortic valve. Calcified coronary artery disease. Ascending thoracic aortic aneurysm. Recommend semi-annual imaging followup by CTA or MRA and referral to cardiothoracic surgery if not already obtained as warranted. This recommendation follows 2010 ACCF/AHA/AATS/ACR/ASA/SCA/SCAI/SIR/STS/SVM Guidelines for the Diagnosis and Management of Patients With Thoracic Aortic Disease. Circulation. 2010; 121: L381-O175. Aortic aneurysm NOS (ICD10-I71.9) 8.  Dilated main pulmonary artery can be seen in the setting of pulmonary arterial hypertension. Aortic Atherosclerosis (ICD10-I70.0). Electronically Signed   By: Zetta Bills M.D.   On: 10/14/2019 15:48   DG Chest 1 View  Result Date: 09/29/2019 CLINICAL DATA:  Shortness of breath and cough. EXAM: CHEST  1 VIEW COMPARISON:  08/14/2019 radiograph and prior studies. FINDINGS: Mild bibasilar streaky/patchy opacities are noted which may represent atelectasis versus pneumonia. Cardiomediastinal silhouette is unremarkable. There is no evidence of pulmonary edema, suspicious pulmonary nodule/mass, pleural effusion, or pneumothorax. No acute bony abnormalities are identified. IMPRESSION: Mild bibasilar opacities which may represent atelectasis versus pneumonia. Electronically Signed   By: Margarette Canada M.D.   On: 10/11/2019 14:11   CT Chest Wo Contrast  Result Date: 10/04/2019 CLINICAL DATA:  Cough, persistent cough EXAM: CT CHEST, ABDOMEN AND PELVIS WITHOUT CONTRAST TECHNIQUE: Multidetector CT imaging of the chest, abdomen and pelvis was performed following the standard protocol without IV contrast. COMPARISON:  Chest evaluation from July of 2021, abdomen and pelvis from August 14, 2019 FINDINGS: CT CHEST FINDINGS Cardiovascular: Calcified atheromatous plaque of the thoracic aorta. Calcified aortic valve. Calcified coronary artery disease. Normal heart size. No pericardial effusion. Limited assessment of vascular structures in the chest due to lack of intravenous contrast. 4.6 cm ascending thoracic aorta. Central pulmonary arteries dilated to 4.2 cm. Mediastinum/Nodes: Thoracic inlet structures are normal. No axillary lymphadenopathy. No mediastinal lymphadenopathy. No gross hilar adenopathy. Assessment limited by lack of intravenous contrast. Esophagus normal. Lungs/Pleura: Patchy areas of tree-in-bud opacity and scattered nodules in the upper lobes with similar appearance. Interval development of similar process in  the superior segment of the LEFT lower lobe but with consolidative changes at the LEFT lung base and signs of bronchial wall thickening that are new when compared to previous imaging. RIGHT lower lobe atelectasis. RIGHT lower lobe bronchial wall thickening.  Airways are patent. Musculoskeletal: No chest wall mass. See below for full musculoskeletal details. CT ABDOMEN PELVIS FINDINGS Hepatobiliary: No focal, suspicious hepatic lesion on noncontrast imaging. Marked pericholecystic stranding in the setting of cholelithiasis with distended gallbladder. Small amount of pericholecystic fluid. Pancreas: Pancreatic atrophy without peripancreatic stranding. Spleen: Spleen normal in size and contour. Adrenals/Urinary Tract: Adrenal glands are normal. Nephrolithiasis with large LEFT renal calculi. Resolution of hydronephrosis that was seen on the previous imaging study on the LEFT. Dilated lower pole collecting systems that appears to be a chronic finding similar to the prior study. RIGHT-sided nephrolithiasis. Intermediate density lesion arising from the medial upper pole of the RIGHT kidney associated with calcification measuring approximately 3.7 cm greatest dimension, similar size as measured by this observer on the prior study. Urinary bladder is normal. Stomach/Bowel: Stomach under distended. Mild stranding adjacent to the duodenum presumably related to adjacent acute cholecystitis. No acute small bowel process. Post RIGHT hemicolectomy with secondary thickening also of the ileum in the RIGHT upper quadrant. Mild rectal thickening in the setting of marked rectal distension with mild perirectal stranding. The rectum measuring approximately 9 x 11 cm. No current pneumatosis. Vascular/Lymphatic: Marked calcific atheromatous plaque of the abdominal aorta. No aneurysmal dilation. No adenopathy. No pelvic adenopathy. Calcific atheromatous plaque tracks into the pelvis, iliac vessels. Reproductive: Calcifications of the  prostate, mass-effect upon the gland secondary to marked rectal distension. Prostate not well evaluated. Other: No free air.  No abscess. Musculoskeletal: Spinal degenerative changes. No acute or destructive bone finding. IMPRESSION: 1. Acute cholecystitis. Interval development of consolidative changes at the LEFT lung base and signs of bronchial wall thickening. Findings may represent pneumonia or aspiration. 2. Signs of potential aspiration superimposed on chronic infection. 3. Fecal impaction with signs of stercoral colitis. 4. Bilateral nephrolithiasis with resolution of hydronephrosis that was seen on the previous imaging study on the LEFT. Though still with dilated lower pole collecting system elements, potentially due to infundibular  stricture and large calculi on the LEFT. The large cystic area at the lower aspect of the LEFT kidney is likely related to markedly dilated caliceal elements. 5. Bilateral nephrolithiasis otherwise similar to the previous study. 6. **An incidental finding of potential clinical significance has been found. Partially calcified lesion arising from the medial, posterior hilar lip of the RIGHT kidney may represent a solid renal neoplasm. Could consider renal sonogram as an initial means of evaluation or follow-up MRI in 3-6 months. Findings unchanged compared to June 30th of 2021. ** 7. 4.6 cm ascending thoracic aorta. Calcified aortic valve. Calcified coronary artery disease. Ascending thoracic aortic aneurysm. Recommend semi-annual imaging followup by CTA or MRA and referral to cardiothoracic surgery if not already obtained as warranted. This recommendation follows 2010 ACCF/AHA/AATS/ACR/ASA/SCA/SCAI/SIR/STS/SVM Guidelines for the Diagnosis and Management of Patients With Thoracic Aortic Disease. Circulation. 2010; 121: V371-G626. Aortic aneurysm NOS (ICD10-I71.9) 8. Dilated main pulmonary artery can be seen in the setting of pulmonary arterial hypertension. Aortic Atherosclerosis  (ICD10-I70.0). Electronically Signed   By: Zetta Bills M.D.   On: 09/30/2019 15:48    ____________________________________________   PROCEDURES  Procedure(s) performed (including Critical Care):  .1-3 Lead EKG Interpretation Performed by: Lucrezia Starch, MD Authorized by: Lucrezia Starch, MD     Interpretation: abnormal     ECG rate assessment: tachycardic     Rhythm: atrial fibrillation     Ectopy: none     Conduction: normal   .Critical Care Performed by: Lucrezia Starch, MD Authorized by: Lucrezia Starch, MD   Critical care provider statement:    Critical care time (minutes):  120   Critical care was necessary to treat or prevent imminent or life-threatening deterioration of the following conditions:  Sepsis, shock, dehydration, renal failure and endocrine crisis   Critical care was time spent personally by me on the following activities:  Discussions with consultants, evaluation of patient's response to treatment, examination of patient, ordering and performing treatments and interventions, ordering and review of laboratory studies, ordering and review of radiographic studies, pulse oximetry, re-evaluation of patient's condition, obtaining history from patient or surrogate and review of old charts     ____________________________________________   INITIAL IMPRESSION / [place] / ED COURSE        Overall patient's history, exam, and initial ED work-up is concerning for septic shock with a lactate greater than 4, tachycardia, tachypnea, and elevated white blood cell count.  Possible etiologies include aspiration pneumonia noted on above CT, cholecystitis, and stercoral colitis all noted on CT as well.  Patient is noted to be in A. fib with RVR with elevated troponin.  This is likely secondary to his septic shock.  On arrival patient was immediately made a code sepsis and after cultures were obtained he was given broad-spectrum IV antibiotics and IV fluids.   I did discuss with on-call general surgery service consultant Dr. Perrin Maltese patient's findings of acute cholecystitis and he recommended initial medical resuscitation and that he would see the patient once admitted.  Care of patient assumed by Dr. Charna Archer at approximately 1600.  Plan is to follow-up repeat lactic acid and troponin to assess stability for [facility] versus need for ICU level care if these parameters are worsening.  In addition to above findings patient is noted to have significant kidney injury, and hypoglycemia necessitating D50.  No evidence of hypercarbic respiratory failure on VBG.  No history or exam findings to suggest recent trauma.  No focal exam findings to suggest  CVA..  Patient's mild confusion likely secondary to encephalopathy from sepsis.  Medications  lactated ringers infusion (has no administration in time range)  lactated ringers bolus 1,000 mL (1,000 mLs Intravenous New Bag/Given 10/07/2019 1449)    And  lactated ringers bolus 1,000 mL (has no administration in time range)    And  lactated ringers bolus 1,000 mL (1,000 mLs Intravenous New Bag/Given 09/20/2019 1559)  aspirin tablet 325 mg (has no administration in time range)  sodium chloride flush (NS) 0.9 % injection 3 mL (3 mLs Intravenous Given 10/11/2019 1557)  sodium chloride flush (NS) 0.9 % injection 3 mL (has no administration in time range)  0.9 %  sodium chloride infusion (has no administration in time range)  ceFEPIme (MAXIPIME) 2 g in sodium chloride 0.9 % 100 mL IVPB (0 g Intravenous Stopped 09/30/2019 1511)  metroNIDAZOLE (FLAGYL) IVPB 500 mg (0 mg Intravenous Stopped 09/17/2019 1551)  vancomycin (VANCOCIN) IVPB 1000 mg/200 mL premix (1,000 mg Intravenous New Bag/Given 10/13/2019 1451)  dextrose 50 % solution 50 mL (50 mLs Intravenous Given 09/24/2019 1553)    ____________________________________________   FINAL CLINICAL IMPRESSION(S) / ED DIAGNOSES  Final diagnoses:  Septic shock (HCC)  Aspiration pneumonia, unspecified  aspiration pneumonia type, unspecified laterality, unspecified part of lung ([place])  Acute cholecystitis  Fecal impaction (HCC)  Atrial fibrillation with RVR (HCC)  Troponin I above reference range  AKI (acute kidney injury) [facility])     ED Discharge Orders    None       Note:  This document was prepared using Dragon voice recognition software and may include unintentional dictation errors.   Lucrezia Starch, MD 09/17/2019 [phone]

## 2019-10-06 NOTE — Progress Notes (Signed)
CODE SEPSIS - PHARMACY COMMUNICATION  **Broad Spectrum Antibiotics should be administered within 1 hour of Sepsis diagnosis**  Time Code Sepsis Called/Page Received: 1439  Antibiotics Ordered: vancomycin/cefepime/metronidazole  Time of 1st antibiotic administration: 9810  Additional action taken by pharmacy: n/a  Benita Gutter  09/27/2019  2:52 PM

## 2019-10-06 NOTE — ED Triage Notes (Signed)
Pt arrives via EMS from [facility] after they have noticed his CBGs beign lower than normal (60-70)- pt has not been eating well- pt sounds very congested and has a wet cough- pt RA O2 was 83%- pt placed on 2L [place]- per care facility pt was also hypotensive but EMS states they got a BP of 122/95

## 2019-10-06 NOTE — Progress Notes (Signed)
Notified provider and bedside nurse of need to order repeat lactic acid.  Second lactic continued to climb to 6.9. message send to MD and bedside Rn asking to consider adding a 3rd lactic to be drawn

## 2019-10-07 DIAGNOSIS — K810 Acute cholecystitis: Secondary | ICD-10-CM | POA: Diagnosis present

## 2019-10-07 DIAGNOSIS — R6520 Severe sepsis without septic shock: Secondary | ICD-10-CM | POA: Diagnosis not present

## 2019-10-07 DIAGNOSIS — R6521 Severe sepsis with septic shock: Secondary | ICD-10-CM | POA: Diagnosis present

## 2019-10-07 DIAGNOSIS — E11649 Type 2 diabetes mellitus with hypoglycemia without coma: Secondary | ICD-10-CM | POA: Diagnosis present

## 2019-10-07 DIAGNOSIS — N390 Urinary tract infection, site not specified: Secondary | ICD-10-CM | POA: Diagnosis present

## 2019-10-07 DIAGNOSIS — Z66 Do not resuscitate: Secondary | ICD-10-CM | POA: Diagnosis present

## 2019-10-07 DIAGNOSIS — Z801 Family history of malignant neoplasm of trachea, bronchus and lung: Secondary | ICD-10-CM | POA: Diagnosis not present

## 2019-10-07 DIAGNOSIS — E785 Hyperlipidemia, unspecified: Secondary | ICD-10-CM | POA: Diagnosis present

## 2019-10-07 DIAGNOSIS — Z20822 Contact with and (suspected) exposure to covid-19: Secondary | ICD-10-CM | POA: Diagnosis present

## 2019-10-07 DIAGNOSIS — N138 Other obstructive and reflux uropathy: Secondary | ICD-10-CM | POA: Diagnosis present

## 2019-10-07 DIAGNOSIS — Z7189 Other specified counseling: Secondary | ICD-10-CM | POA: Diagnosis not present

## 2019-10-07 DIAGNOSIS — N179 Acute kidney failure, unspecified: Secondary | ICD-10-CM | POA: Diagnosis present

## 2019-10-07 DIAGNOSIS — Z9842 Cataract extraction status, left eye: Secondary | ICD-10-CM | POA: Diagnosis not present

## 2019-10-07 DIAGNOSIS — E1140 Type 2 diabetes mellitus with diabetic neuropathy, unspecified: Secondary | ICD-10-CM | POA: Diagnosis not present

## 2019-10-07 DIAGNOSIS — N401 Enlarged prostate with lower urinary tract symptoms: Secondary | ICD-10-CM | POA: Diagnosis not present

## 2019-10-07 DIAGNOSIS — Z87442 Personal history of urinary calculi: Secondary | ICD-10-CM | POA: Diagnosis not present

## 2019-10-07 DIAGNOSIS — E872 Acidosis: Secondary | ICD-10-CM | POA: Diagnosis not present

## 2019-10-07 DIAGNOSIS — Z87891 Personal history of nicotine dependence: Secondary | ICD-10-CM | POA: Diagnosis not present

## 2019-10-07 DIAGNOSIS — I4891 Unspecified atrial fibrillation: Secondary | ICD-10-CM | POA: Diagnosis not present

## 2019-10-07 DIAGNOSIS — Z85038 Personal history of other malignant neoplasm of large intestine: Secondary | ICD-10-CM | POA: Diagnosis not present

## 2019-10-07 DIAGNOSIS — Z9049 Acquired absence of other specified parts of digestive tract: Secondary | ICD-10-CM | POA: Diagnosis not present

## 2019-10-07 DIAGNOSIS — Z9841 Cataract extraction status, right eye: Secondary | ICD-10-CM | POA: Diagnosis not present

## 2019-10-07 DIAGNOSIS — A419 Sepsis, unspecified organism: Secondary | ICD-10-CM | POA: Diagnosis present

## 2019-10-07 DIAGNOSIS — J690 Pneumonitis due to inhalation of food and vomit: Secondary | ICD-10-CM | POA: Diagnosis present

## 2019-10-07 DIAGNOSIS — Z961 Presence of intraocular lens: Secondary | ICD-10-CM | POA: Diagnosis present

## 2019-10-07 DIAGNOSIS — G9341 Metabolic encephalopathy: Secondary | ICD-10-CM | POA: Diagnosis present

## 2019-10-07 DIAGNOSIS — I10 Essential (primary) hypertension: Secondary | ICD-10-CM | POA: Diagnosis present

## 2019-10-07 DIAGNOSIS — Z515 Encounter for palliative care: Secondary | ICD-10-CM | POA: Diagnosis not present

## 2019-10-07 DIAGNOSIS — Z888 Allergy status to other drugs, medicaments and biological substances status: Secondary | ICD-10-CM | POA: Diagnosis not present

## 2019-10-07 DIAGNOSIS — Z79899 Other long term (current) drug therapy: Secondary | ICD-10-CM | POA: Diagnosis not present

## 2019-10-07 MED ORDER — SCOPOLAMINE 1 MG/3DAYS TD PT72
1.0000 | MEDICATED_PATCH | TRANSDERMAL | Status: DC
  Administered 2019-10-07: 1.5 mg via TRANSDERMAL
  Filled 2019-10-07 (×2): qty 1

## 2019-10-07 NOTE — ED Notes (Signed)
Pt was able to follow command and place temperature probe under tongue.

## 2019-10-07 NOTE — Progress Notes (Signed)
PROGRESS NOTE    Shane Grant  PIR:[id number] DOB: [birth year] DOA: 10/02/2019 PCP: Shane Carbon, MD   Brief Narrative: Patient admitted was severe sepsis and found to have cholecystitis, urinary tract infection, pneumonia.  Patient's prognosis is guarded.  Family understands does not want heroic measures or life prolonging measures or put him through any pain or further suffering.  Initially wife Ms. Shane Grant and son Shane Grant wanted [name] to be treated with IV antibiotics and pain medications to be made comfortable.  Last night however patient's CODE STATUS was changed to comfort measures only.   Assessment & Plan:   Principal Problem:   Severe sepsis ([place]) Active Problems:   Hypertension   Type 2 diabetes, controlled, with neuropathy ([place])   History of colon cancer   Kidney stones   BPH with obstruction/lower urinary tract symptoms   Obstructive uropathy   Metabolic acidosis with increased anion gap and reduced excretion of inorganic acids Admitted with severe sepsis responsive to IV fluids, and morphine pain today. She is comfort measures only.  Family has agreed to Foley to avoid bladder distention and discomfort.  The fluids have been decreased to 75 cc/h. No lab draws no IV labs.  DVT prophylaxis: (Lovenox/Heparin/SCD's/anticoagulated/None (if comfort care) Code Status: Not resuscitate with comfort measures.   Family Communication: Please look at my history and physical for family contact information.    Consultants:   Palliative care/hospice consult.  Procedures: None.  Antimicrobials: Initially patient received antibiotics for empiric coverage patient is now comfort measures.  Subjective: Patient is somnolent minimally arousable to touch and voice open eyes and follows commands slowly and as he can but not all commands.  Family at bedside all 3 sons and their mother.  Objective: Vitals:   10/07/19 0126 10/07/19 0130 10/07/19 0214 10/07/19 1241  BP:  111/84 (!)  106/56 (!) 94/57  Pulse:  89 93 73  Resp:  18 (!) 22 20  Temp: 98.2 F (36.8 C)  97.7 F (36.5 C) 97.8 F (36.6 C)  TempSrc: Oral  Oral Axillary  SpO2:  98% 91% 92%  Weight:   92.6 kg   Height:   6\' 6"  (1.981 m)     Intake/Output Summary (Last 24 hours) at 10/07/2019 1746 Last data filed at 10/07/2019 1641 Gross per 24 hour  Intake 1878.09 ml  Output 0 ml  Net 1878.09 ml   Filed Weights   09/19/2019 1349 10/07/19 0214  Weight: 94.1 kg 92.6 kg    Examination:  General exam: Appears calm and comfortable  Respiratory system: Clear to auscultation. Respiratory effort normal. Cardiovascular system: Regular rate rhythm  gastrointestinal system: Abdomen is nondistended, soft and nontender. No organomegaly or masses felt. Normal bowel sounds heard. Central nervous system: Somnolent Extremities: [place] measures.   Skin: No rashes, lesions or ulcers  Data Reviewed: I have personally reviewed following labs and imaging studies  CBC: Recent Labs  Lab 09/29/2019 1355  WBC 39.8*  NEUTROABS 35.2*  HGB 9.6*  HCT 30.8*  MCV 94.2  PLT 630*   Basic Metabolic Panel: Recent Labs  Lab 09/18/2019 1355  NA 140  K 4.9  CL 105  CO2 18*  GLUCOSE 60*  BUN 81*  CREATININE 4.08*  CALCIUM 9.1  MG 1.8   GFR: Estimated Creatinine Clearance: 17.1 mL/min (A) (by C-G formula based on SCr of 4.08 mg/dL (H)). Liver Function Tests: Recent Labs  Lab 09/25/2019 1355  AST 59*  ALT 37  ALKPHOS 167*  BILITOT 1.0  PROT  7.6  ALBUMIN 2.3*   Recent Labs  Lab 10/01/2019 1355  LIPASE 36   No results for input(s): AMMONIA in the last 168 hours. Coagulation Profile: Recent Labs  Lab 09/28/2019 1637  INR 1.4*   Cardiac Enzymes: No results for input(s): CKTOTAL, CKMB, CKMBINDEX, TROPONINI in the last 168 hours. BNP (last 3 results) No results for input(s): PROBNP in the last 8760 hours. HbA1C: No results for input(s): HGBA1C in the last 72 hours. CBG: Recent Labs  Lab 10/02/2019 1641  10/14/2019 2217 09/18/2019 2351  GLUCAP 266* 41* 104*   Lipid Profile: No results for input(s): CHOL, HDL, LDLCALC, TRIG, CHOLHDL, LDLDIRECT in the last 72 hours. Thyroid Function Tests: No results for input(s): TSH, T4TOTAL, FREET4, T3FREE, THYROIDAB in the last 72 hours. Anemia Panel: No results for input(s): VITAMINB12, FOLATE, FERRITIN, TIBC, IRON, RETICCTPCT in the last 72 hours. Sepsis Labs: Recent Labs  Lab 09/16/2019 1355 10/08/2019 1637 10/05/2019 1923  PROCALCITON 1.43  --   --   LATICACIDVEN 4.5* 6.9* 7.9*    Recent Results (from the past 240 hour(s))  SARS Coronavirus 2 by RT PCR (hospital order, performed in [facility] hospital lab) Nasopharyngeal Nasopharyngeal Swab     Status: None   Collection Time: 09/30/2019  1:55 PM   Specimen: Nasopharyngeal Swab  Result Value Ref Range Status   SARS Coronavirus 2 NEGATIVE NEGATIVE Final    Comment: (NOTE) SARS-CoV-2 target nucleic acids are NOT DETECTED.  The SARS-CoV-2 RNA is generally detectable in upper and lower respiratory specimens during the acute phase of infection. The lowest concentration of SARS-CoV-2 viral copies this assay can detect is 250 copies / mL. A negative result does not preclude SARS-CoV-2 infection and should not be used as the sole basis for treatment or other patient management decisions.  A negative result may occur with improper specimen collection / handling, submission of specimen other than nasopharyngeal swab, presence of viral mutation(s) within the areas targeted by this assay, and inadequate number of viral copies (<250 copies / mL). A negative result must be combined with clinical observations, patient history, and epidemiological information.  Fact Sheet for Patients:   StrictlyIdeas.no  Fact Sheet for Healthcare Providers: BankingDealers.co.za  This test is not yet approved or  cleared by the [place] FDA and has been authorized for  detection and/or diagnosis of SARS-CoV-2 by FDA under an Emergency Use Authorization (EUA).  This EUA will remain in effect (meaning this test can be used) for the duration of the COVID-19 declaration under Section 564(b)(1) of the Act, 21 U.S.C. section 360bbb-3(b)(1), unless the authorization is terminated or revoked sooner.  Performed at [facility], [address] [place], [place] [address]   Blood culture (routine x 2)     Status: None (Preliminary result)   Collection Time: 10/04/2019  1:55 PM   Specimen: BLOOD  Result Value Ref Range Status   Specimen Description BLOOD BLOOD RIGHT HAND  Final   Special Requests   Final    BOTTLES DRAWN AEROBIC AND ANAEROBIC Blood Culture adequate volume   Culture   Final    NO GROWTH < 24 HOURS Performed at [facility], [address] [place], [place] [address]    Report Status PENDING  Incomplete  Blood culture (routine x 2)     Status: None (Preliminary result)   Collection Time: 09/16/2019  1:55 PM   Specimen: BLOOD  Result Value Ref Range Status   Specimen Description BLOOD BLOOD RIGHT FOREARM  Final  Special Requests   Final    BOTTLES DRAWN AEROBIC AND ANAEROBIC Blood Culture adequate volume   Culture   Final    NO GROWTH < 24 HOURS Performed at [facility], [address] [place], [place] [address]    Report Status PENDING  Incomplete         Radiology Studies: CT ABDOMEN PELVIS WO CONTRAST  Result Date: 10/09/2019 CLINICAL DATA:  Cough, persistent cough EXAM: CT CHEST, ABDOMEN AND PELVIS WITHOUT CONTRAST TECHNIQUE: Multidetector CT imaging of the chest, abdomen and pelvis was performed following the standard protocol without IV contrast. COMPARISON:  Chest evaluation from July of 2021, abdomen and pelvis from August 14, 2019 FINDINGS: CT CHEST FINDINGS Cardiovascular: Calcified atheromatous plaque of the thoracic aorta. Calcified aortic valve. Calcified coronary artery disease. Normal heart  size. No pericardial effusion. Limited assessment of vascular structures in the chest due to lack of intravenous contrast. 4.6 cm ascending thoracic aorta. Central pulmonary arteries dilated to 4.2 cm. Mediastinum/Nodes: Thoracic inlet structures are normal. No axillary lymphadenopathy. No mediastinal lymphadenopathy. No gross hilar adenopathy. Assessment limited by lack of intravenous contrast. Esophagus normal. Lungs/Pleura: Patchy areas of tree-in-bud opacity and scattered nodules in the upper lobes with similar appearance. Interval development of similar process in the superior segment of the LEFT lower lobe but with consolidative changes at the LEFT lung base and signs of bronchial wall thickening that are new when compared to previous imaging. RIGHT lower lobe atelectasis. RIGHT lower lobe bronchial wall thickening.  Airways are patent. Musculoskeletal: No chest wall mass. See below for full musculoskeletal details. CT ABDOMEN PELVIS FINDINGS Hepatobiliary: No focal, suspicious hepatic lesion on noncontrast imaging. Marked pericholecystic stranding in the setting of cholelithiasis with distended gallbladder. Small amount of pericholecystic fluid. Pancreas: Pancreatic atrophy without peripancreatic stranding. Spleen: Spleen normal in size and contour. Adrenals/Urinary Tract: Adrenal glands are normal. Nephrolithiasis with large LEFT renal calculi. Resolution of hydronephrosis that was seen on the previous imaging study on the LEFT. Dilated lower pole collecting systems that appears to be a chronic finding similar to the prior study. RIGHT-sided nephrolithiasis. Intermediate density lesion arising from the medial upper pole of the RIGHT kidney associated with calcification measuring approximately 3.7 cm greatest dimension, similar size as measured by this observer on the prior study. Urinary bladder is normal. Stomach/Bowel: Stomach under distended. Mild stranding adjacent to the duodenum presumably related to  adjacent acute cholecystitis. No acute small bowel process. Post RIGHT hemicolectomy with secondary thickening also of the ileum in the RIGHT upper quadrant. Mild rectal thickening in the setting of marked rectal distension with mild perirectal stranding. The rectum measuring approximately 9 x 11 cm. No current pneumatosis. Vascular/Lymphatic: Marked calcific atheromatous plaque of the abdominal aorta. No aneurysmal dilation. No adenopathy. No pelvic adenopathy. Calcific atheromatous plaque tracks into the pelvis, iliac vessels. Reproductive: Calcifications of the prostate, mass-effect upon the gland secondary to marked rectal distension. Prostate not well evaluated. Other: No free air.  No abscess. Musculoskeletal: Spinal degenerative changes. No acute or destructive bone finding. IMPRESSION: 1. Acute cholecystitis. Interval development of consolidative changes at the LEFT lung base and signs of bronchial wall thickening. Findings may represent pneumonia or aspiration. 2. Signs of potential aspiration superimposed on chronic infection. 3. Fecal impaction with signs of stercoral colitis. 4. Bilateral nephrolithiasis with resolution of hydronephrosis that was seen on the previous imaging study on the LEFT. 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 The large cystic area at the lower aspect of the LEFT kidney is likely related to markedly dilated caliceal elements. 5. Bilateral nephrolithiasis otherwise similar to the previous study. 6. **An incidental finding of potential clinical significance has been found. Partially calcified lesion arising from the medial, posterior hilar lip of the RIGHT kidney may represent a solid renal neoplasm. Could consider renal sonogram as an initial means of evaluation or follow-up MRI in 3-6 months. Findings unchanged compared to June 30th of 2021. ** 7. 4.6 cm ascending thoracic aorta.  Calcified aortic valve. Calcified coronary artery disease. Ascending thoracic aortic aneurysm. Recommend semi-annual imaging followup by CTA or MRA and referral to cardiothoracic surgery if not already obtained as warranted. This recommendation follows 2010 ACCF/AHA/AATS/ACR/ASA/SCA/SCAI/SIR/STS/SVM Guidelines for the Diagnosis and Management of Patients With Thoracic Aortic Disease. Circulation. 2010; 121: I370-W888. Aortic aneurysm NOS (ICD10-I71.9) 8. Dilated main pulmonary artery can be seen in the setting of pulmonary arterial hypertension. Aortic Atherosclerosis (ICD10-I70.0). Electronically Signed   By: Zetta Bills M.D.   On: 10/13/2019 15:48   DG Chest 1 View  Result Date: 10/05/2019 CLINICAL DATA:  Shortness of breath and cough. EXAM: CHEST  1 VIEW COMPARISON:  08/14/2019 radiograph and prior studies. FINDINGS: Mild bibasilar streaky/patchy opacities are noted which may represent atelectasis versus pneumonia. Cardiomediastinal silhouette is unremarkable. There is no evidence of pulmonary edema, suspicious pulmonary nodule/mass, pleural effusion, or pneumothorax. No acute bony abnormalities are identified. IMPRESSION: Mild bibasilar opacities which may represent atelectasis versus pneumonia. Electronically Signed   By: Margarette Canada M.D.   On: 09/25/2019 14:11   CT Chest Wo Contrast  Result Date: 10/01/2019 CLINICAL DATA:  Cough, persistent cough EXAM: CT CHEST, ABDOMEN AND PELVIS WITHOUT CONTRAST TECHNIQUE: Multidetector CT imaging of the chest, abdomen and pelvis was performed following the standard protocol without IV contrast. COMPARISON:  Chest evaluation from July of 2021, abdomen and pelvis from August 14, 2019 FINDINGS: CT CHEST FINDINGS Cardiovascular: Calcified atheromatous plaque of the thoracic aorta. Calcified aortic valve. Calcified coronary artery disease. Normal heart size. No pericardial effusion. Limited assessment of vascular structures in the chest due to lack of intravenous  contrast. 4.6 cm ascending thoracic aorta. Central pulmonary arteries dilated to 4.2 cm. Mediastinum/Nodes: Thoracic inlet structures are normal. No axillary lymphadenopathy. No mediastinal lymphadenopathy. No gross hilar adenopathy. Assessment limited by lack of intravenous contrast. Esophagus normal. Lungs/Pleura: Patchy areas of tree-in-bud opacity and scattered nodules in the upper lobes with similar appearance. Interval development of similar process in the superior segment of the LEFT lower lobe but with consolidative changes at the LEFT lung base and signs of bronchial wall thickening that are new when compared to previous imaging. RIGHT lower lobe atelectasis. RIGHT lower lobe bronchial wall thickening.  Airways are patent. Musculoskeletal: No chest wall mass. See below for full musculoskeletal details. CT ABDOMEN PELVIS FINDINGS Hepatobiliary: No focal, suspicious hepatic lesion on noncontrast imaging. Marked pericholecystic stranding in the setting of cholelithiasis with distended gallbladder. Small amount of pericholecystic fluid. Pancreas: Pancreatic atrophy without peripancreatic stranding. Spleen: Spleen normal in size and contour. Adrenals/Urinary Tract: Adrenal glands are normal. Nephrolithiasis with large LEFT renal calculi. Resolution of hydronephrosis that was seen on the previous imaging study on the LEFT. Dilated lower pole collecting systems that appears to be a chronic finding similar to the prior study. RIGHT-sided nephrolithiasis. Intermediate density lesion arising from the medial upper pole of the RIGHT kidney associated with calcification measuring approximately 3.7 cm greatest dimension, similar size as measured by this observer  on the prior study. Urinary bladder is normal. Stomach/Bowel: Stomach under distended. Mild stranding adjacent to the duodenum presumably related to adjacent acute cholecystitis. No acute small bowel process. Post RIGHT hemicolectomy with secondary thickening  also of the ileum in the RIGHT upper quadrant. Mild rectal thickening in the setting of marked rectal distension with mild perirectal stranding. The rectum measuring approximately 9 x 11 cm. No current pneumatosis. Vascular/Lymphatic: Marked calcific atheromatous plaque of the abdominal aorta. No aneurysmal dilation. No adenopathy. No pelvic adenopathy. Calcific atheromatous plaque tracks into the pelvis, iliac vessels. Reproductive: Calcifications of the prostate, mass-effect upon the gland secondary to marked rectal distension. Prostate not well evaluated. Other: No free air.  No abscess. Musculoskeletal: Spinal degenerative changes. No acute or destructive bone finding. IMPRESSION: 1. Acute cholecystitis. Interval development of consolidative changes at the LEFT lung base and signs of bronchial wall thickening. Findings may represent pneumonia or aspiration. 2. Signs of potential aspiration superimposed on chronic infection. 3. Fecal impaction with signs of stercoral colitis. 4. Bilateral nephrolithiasis with resolution of hydronephrosis that was seen on the previous imaging study on the LEFT. 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 The large cystic area at the lower aspect of the LEFT kidney is likely related to markedly dilated caliceal elements. 5. Bilateral nephrolithiasis otherwise similar to the previous study. 6. **An incidental finding of potential clinical significance has been found. Partially calcified lesion arising from the medial, posterior hilar lip of the RIGHT kidney may represent a solid renal neoplasm. Could consider renal sonogram as an initial means of evaluation or follow-up MRI in 3-6 months. Findings unchanged compared to June 30th of 2021. ** 7. 4.6 cm ascending thoracic aorta. Calcified aortic valve. Calcified coronary artery disease. Ascending thoracic aortic aneurysm. Recommend semi-annual  imaging followup by CTA or MRA and referral to cardiothoracic surgery if not already obtained as warranted. This recommendation follows 2010 ACCF/AHA/AATS/ACR/ASA/SCA/SCAI/SIR/STS/SVM Guidelines for the Diagnosis and Management of Patients With Thoracic Aortic Disease. Circulation. 2010; 121: N629-B284. Aortic aneurysm NOS (ICD10-I71.9) 8. Dilated main pulmonary artery can be seen in the setting of pulmonary arterial hypertension. Aortic Atherosclerosis (ICD10-I70.0). Electronically Signed   By: Zetta Bills M.D.   On: 09/26/2019 15:48        Scheduled Meds: . scopolamine  1 patch Transdermal Q72H  . sodium chloride flush  3 mL Intravenous Q12H   Continuous Infusions: . lactated ringers 75 mL/hr at 10/07/19 1604     LOS: 0 days    Para Skeans, MD [facility] Pager [phone] If 7PM-7AM, please contact night-coverage [email or web address] Password [facility] 10/07/2019, 5:46 PM

## 2019-10-07 NOTE — Progress Notes (Signed)
Family requested to speak with provider with concern for why antibiotics were still being administered.  Chart was reviewed.  Apparently palliative care consult was recommended by intensivist Dr. Mortimer Fries however had not yet been done.  Spoke with son, Shane Grant who would like to proceed with comfort care only.  He wants all treatment including IV antibiotics discontinued but would like to continue IV fluids and IV morphine. Plan: -Oral antibiotic therapy discontinued -Discontinued all future blood work -IV fluids adjusted.  IV morphine continued -Comfort care measures ordered -Discontinued every 2 hours vital signs

## 2019-10-07 NOTE — ED Notes (Signed)
pts Son:  [name]: [phone]

## 2019-10-07 NOTE — Progress Notes (Signed)
Referral received for goals of care. Due to increase consult volumes and limited availability their will be a delay in seeing this patient.   If you have urgent needs requiring recommendations of care while consult is pending please contact our team line at [phone] and one of our providers will attempt to assist.   Every attempt will be made to see patient in upcoming days if hospitalized.   Thank you for your referral.   Palliative Medicine Team

## 2019-10-07 NOTE — Progress Notes (Signed)
Brief Progress Note Chart reviewed. Patient made comfort measures only overnight. General surgery will sign off and not see as to not disrupt patient and family.   D/W Dr Dahlia Byes.   -- Edison Simon, PA-C [facility] 10/07/2019, 7:54 AM [phone] M-F: 7am - 4pm

## 2019-10-08 DIAGNOSIS — J690 Pneumonitis due to inhalation of food and vomit: Secondary | ICD-10-CM

## 2019-10-08 DIAGNOSIS — I10 Essential (primary) hypertension: Secondary | ICD-10-CM

## 2019-10-08 DIAGNOSIS — I4891 Unspecified atrial fibrillation: Secondary | ICD-10-CM

## 2019-10-08 DIAGNOSIS — Z515 Encounter for palliative care: Secondary | ICD-10-CM

## 2019-10-08 DIAGNOSIS — Z66 Do not resuscitate: Secondary | ICD-10-CM

## 2019-10-08 DIAGNOSIS — R6521 Severe sepsis with septic shock: Secondary | ICD-10-CM

## 2019-10-08 DIAGNOSIS — E1140 Type 2 diabetes mellitus with diabetic neuropathy, unspecified: Secondary | ICD-10-CM

## 2019-10-08 DIAGNOSIS — N179 Acute kidney failure, unspecified: Secondary | ICD-10-CM

## 2019-10-08 DIAGNOSIS — Z7189 Other specified counseling: Secondary | ICD-10-CM

## 2019-10-08 MED ORDER — MORPHINE SULFATE (PF) 2 MG/ML IV SOLN
2.0000 mg | INTRAVENOUS | Status: DC | PRN
  Administered 2019-10-08 – 2019-10-10 (×4): 2 mg via INTRAVENOUS
  Filled 2019-10-08 (×4): qty 1

## 2019-10-08 MED ORDER — LORAZEPAM 2 MG/ML IJ SOLN
1.0000 mg | INTRAMUSCULAR | Status: DC | PRN
  Administered 2019-10-09 – 2019-10-10 (×2): 1 mg via INTRAVENOUS
  Filled 2019-10-08 (×2): qty 1

## 2019-10-08 MED ORDER — BIOTENE DRY MOUTH MT LIQD: 15.0000 mL | OROMUCOSAL | Status: DC | PRN

## 2019-10-08 MED ORDER — ONDANSETRON HCL 4 MG/2ML IJ SOLN: 4.0000 mg | Freq: Four times a day (QID) | INTRAMUSCULAR | Status: DC | PRN

## 2019-10-08 MED ORDER — POLYVINYL ALCOHOL 1.4 % OP SOLN
1.0000 [drp] | Freq: Four times a day (QID) | OPHTHALMIC | Status: DC | PRN
  Filled 2019-10-08: qty 15

## 2019-10-08 MED ORDER — GLYCOPYRROLATE 0.2 MG/ML IJ SOLN
0.3000 mg | INTRAMUSCULAR | Status: DC | PRN
  Filled 2019-10-08: qty 1.5

## 2019-10-08 NOTE — Progress Notes (Signed)
PROGRESS NOTE    Shane Grant  HWE:[id number] DOB: 1931/07/14 DOA: 09/19/2019 PCP: Venia Carbon, MD   Assessment & Plan:   Principal Problem:   Severe sepsis [facility]) Active Problems:   Hypertension   Type 2 diabetes, controlled, with neuropathy ([place])   History of colon cancer   Kidney stones   BPH with obstruction/lower urinary tract symptoms   Obstructive uropathy   Metabolic acidosis with increased anion gap and reduced excretion of inorganic acids Admitted with severe sepsis responsive to IV fluids, and morphine pain today. She is comfort measures only.  Family has agreed to Foley to avoid bladder distention and discomfort.  The fluids have been decreased to 75 cc/h. No lab draws no IV labs.  Subjective: Patient is somnolent minimally arousable to touch and voice open eyes and follows commands slowly and as he can but not all commands.  Family at bedside all 3 sons and their mother.  Pt is encephalopathic and nonverbal and wife at bedside and pt is comfortable.   Objective: Vitals:   10/07/19 2111 10/08/19 0116 10/08/19 0400 10/08/19 0529  BP: (!) 103/51   (!) 98/59  Pulse: 72   83  Resp: 16 16 16 16   Temp: 97.7 F (36.5 C)   (!) 97.4 F (36.3 C)  TempSrc: Oral   Oral  SpO2: 93%   97%  Weight:      Height:        Intake/Output Summary (Last 24 hours) at 10/08/2019 1507 Last data filed at 10/08/2019 0300 Gross per 24 hour  Intake 2543.26 ml  Output 0 ml  Net 2543.26 ml   Filed Weights   09/19/2019 1349 10/07/19 0214  Weight: 94.1 kg 92.6 kg    Examination: General exam: Appears calm and comfortable  Respiratory system: Clear to auscultation. Respiratory effort normal. Cardiovascular system: Regular rate rhythm  gastrointestinal system: Abdomen is nondistended, soft and nontender. No organomegaly or masses felt. Normal bowel sounds heard. Central nervous system: Somnolent Extremities: [place] measures.   Skin: No rashes, lesions or ulcers.  Data  Reviewed: I have personally reviewed following labs and imaging studies  CBC: Recent Labs  Lab 09/17/2019 1355  WBC 39.8*  NEUTROABS 35.2*  HGB 9.6*  HCT 30.8*  MCV 94.2  PLT 893*   Basic Metabolic Panel: Recent Labs  Lab 10/04/2019 1355  NA 140  K 4.9  CL 105  CO2 18*  GLUCOSE 60*  BUN 81*  CREATININE 4.08*  CALCIUM 9.1  MG 1.8   GFR: Estimated Creatinine Clearance: 17.1 mL/min (A) (by C-G formula based on SCr of 4.08 mg/dL (H)). Liver Function Tests: Recent Labs  Lab 09/25/2019 1355  AST 59*  ALT 37  ALKPHOS 167*  BILITOT 1.0  PROT 7.6  ALBUMIN 2.3*   Recent Labs  Lab 09/17/2019 1355  LIPASE 36   No results for input(s): AMMONIA in the last 168 hours. Coagulation Profile: Recent Labs  Lab 09/26/2019 1637  INR 1.4*   CBG: Recent Labs  Lab 09/30/2019 1641 09/19/2019 2217 09/22/2019 2351  GLUCAP 266* 41* 104*   Sepsis Labs: Recent Labs  Lab 10/05/2019 1355 09/22/2019 1637 10/08/2019 1923  PROCALCITON 1.43  --   --   LATICACIDVEN 4.5* 6.9* 7.9*    Recent Results (from the past 240 hour(s))  SARS Coronavirus 2 by RT PCR (hospital order, performed in [facility] hospital lab) Nasopharyngeal Nasopharyngeal Swab     Status: None   Collection Time: 09/18/2019  1:55 PM   Specimen:  Nasopharyngeal Swab  Result Value Ref Range Status   SARS Coronavirus 2 NEGATIVE NEGATIVE Final    Comment: (NOTE) SARS-CoV-2 target nucleic acids are NOT DETECTED.  The SARS-CoV-2 RNA is generally detectable in upper and lower respiratory specimens during the acute phase of infection. The lowest concentration of SARS-CoV-2 viral copies this assay can detect is 250 copies / mL. A negative result does not preclude SARS-CoV-2 infection and should not be used as the sole basis for treatment or other patient management decisions.  A negative result may occur with improper specimen collection / handling, submission of specimen other than nasopharyngeal swab, presence of viral mutation(s)  within the areas targeted by this assay, and inadequate number of viral copies (<250 copies / mL). A negative result must be combined with clinical observations, patient history, and epidemiological information.  Fact Sheet for Patients:   StrictlyIdeas.no  Fact Sheet for Healthcare Providers: BankingDealers.co.za  This test is not yet approved or  cleared by the [place] FDA and has been authorized for detection and/or diagnosis of SARS-CoV-2 by FDA under an Emergency Use Authorization (EUA).  This EUA will remain in effect (meaning this test can be used) for the duration of the COVID-19 declaration under Section 564(b)(1) of the Act, 21 U.S.C. section 360bbb-3(b)(1), unless the authorization is terminated or revoked sooner.  Performed at [facility], [address] [place], [place] [address]   Blood culture (routine x 2)     Status: None (Preliminary result)   Collection Time: 09/18/2019  1:55 PM   Specimen: BLOOD  Result Value Ref Range Status   Specimen Description BLOOD BLOOD RIGHT HAND  Final   Special Requests   Final    BOTTLES DRAWN AEROBIC AND ANAEROBIC Blood Culture adequate volume   Culture   Final    NO GROWTH 2 DAYS Performed at [facility], [address], [place], [place] [address]    Report Status PENDING  Incomplete  Blood culture (routine x 2)     Status: None (Preliminary result)   Collection Time: 09/18/2019  1:55 PM   Specimen: BLOOD  Result Value Ref Range Status   Specimen Description BLOOD BLOOD RIGHT FOREARM  Final   Special Requests   Final    BOTTLES DRAWN AEROBIC AND ANAEROBIC Blood Culture adequate volume   Culture   Final    NO GROWTH 2 DAYS Performed at [facility], [address] [place], [place] [address]    Report Status PENDING  Incomplete    Radiology Studies: CT ABDOMEN PELVIS WO CONTRAST  Result Date: 10/15/2019 CLINICAL DATA:  Cough, persistent  cough EXAM: CT CHEST, ABDOMEN AND PELVIS WITHOUT CONTRAST TECHNIQUE: Multidetector CT imaging of the chest, abdomen and pelvis was performed following the standard protocol without IV contrast. COMPARISON:  Chest evaluation from July of 2021, abdomen and pelvis from August 14, 2019 FINDINGS: CT CHEST FINDINGS Cardiovascular: Calcified atheromatous plaque of the thoracic aorta. Calcified aortic valve. Calcified coronary artery disease. Normal heart size. No pericardial effusion. Limited assessment of vascular structures in the chest due to lack of intravenous contrast. 4.6 cm ascending thoracic aorta. Central pulmonary arteries dilated to 4.2 cm. Mediastinum/Nodes: Thoracic inlet structures are normal. No axillary lymphadenopathy. No mediastinal lymphadenopathy. No gross hilar adenopathy. Assessment limited by lack of intravenous contrast. Esophagus normal. Lungs/Pleura: Patchy areas of tree-in-bud opacity and scattered nodules in the upper lobes with similar appearance. Interval development of similar process in the superior segment of the LEFT lower lobe but  with consolidative changes at the LEFT lung base and signs of bronchial wall thickening that are new when compared to previous imaging. RIGHT lower lobe atelectasis. RIGHT lower lobe bronchial wall thickening.  Airways are patent. Musculoskeletal: No chest wall mass. See below for full musculoskeletal details. CT ABDOMEN PELVIS FINDINGS Hepatobiliary: No focal, suspicious hepatic lesion on noncontrast imaging. Marked pericholecystic stranding in the setting of cholelithiasis with distended gallbladder. Small amount of pericholecystic fluid. Pancreas: Pancreatic atrophy without peripancreatic stranding. Spleen: Spleen normal in size and contour. Adrenals/Urinary Tract: Adrenal glands are normal. Nephrolithiasis with large LEFT renal calculi. Resolution of hydronephrosis that was seen on the previous imaging study on the LEFT. Dilated lower pole collecting systems  that appears to be a chronic finding similar to the prior study. RIGHT-sided nephrolithiasis. Intermediate density lesion arising from the medial upper pole of the RIGHT kidney associated with calcification measuring approximately 3.7 cm greatest dimension, similar size as measured by this observer on the prior study. Urinary bladder is normal. Stomach/Bowel: Stomach under distended. Mild stranding adjacent to the duodenum presumably related to adjacent acute cholecystitis. No acute small bowel process. Post RIGHT hemicolectomy with secondary thickening also of the ileum in the RIGHT upper quadrant. Mild rectal thickening in the setting of marked rectal distension with mild perirectal stranding. The rectum measuring approximately 9 x 11 cm. No current pneumatosis. Vascular/Lymphatic: Marked calcific atheromatous plaque of the abdominal aorta. No aneurysmal dilation. No adenopathy. No pelvic adenopathy. Calcific atheromatous plaque tracks into the pelvis, iliac vessels. Reproductive: Calcifications of the prostate, mass-effect upon the gland secondary to marked rectal distension. Prostate not well evaluated. Other: No free air.  No abscess. Musculoskeletal: Spinal degenerative changes. No acute or destructive bone finding. IMPRESSION: 1. Acute cholecystitis. Interval development of consolidative changes at the LEFT lung base and signs of bronchial wall thickening. Findings may represent pneumonia or aspiration. 2. Signs of potential aspiration superimposed on chronic infection. 3. Fecal impaction with signs of stercoral colitis. 4. Bilateral nephrolithiasis with resolution of hydronephrosis that was seen on the previous imaging study on the LEFT. 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 The large cystic area at the lower aspect of the LEFT kidney is likely related to markedly dilated caliceal elements. 5. Bilateral  nephrolithiasis otherwise similar to the previous study. 6. **An incidental finding of potential clinical significance has been found. Partially calcified lesion arising from the medial, posterior hilar lip of the RIGHT kidney may represent a solid renal neoplasm. Could consider renal sonogram as an initial means of evaluation or follow-up MRI in 3-6 months. Findings unchanged compared to June 30th of 2021. ** 7. 4.6 cm ascending thoracic aorta. Calcified aortic valve. Calcified coronary artery disease. Ascending thoracic aortic aneurysm. Recommend semi-annual imaging followup by CTA or MRA and referral to cardiothoracic surgery if not already obtained as warranted. This recommendation follows 2010 ACCF/AHA/AATS/ACR/ASA/SCA/SCAI/SIR/STS/SVM Guidelines for the Diagnosis and Management of Patients With Thoracic Aortic Disease. Circulation. 2010; 121: J673-A193. Aortic aneurysm NOS (ICD10-I71.9) 8. Dilated main pulmonary artery can be seen in the setting of pulmonary arterial hypertension. Aortic Atherosclerosis (ICD10-I70.0). Electronically Signed   By: Zetta Bills M.D.   On: 10/04/2019 15:48   CT Chest Wo Contrast  Result Date: 10/09/2019 CLINICAL DATA:  Cough, persistent cough EXAM: CT CHEST, ABDOMEN AND PELVIS WITHOUT CONTRAST TECHNIQUE: Multidetector CT imaging of the chest, abdomen and pelvis was performed following the standard protocol without IV contrast. COMPARISON:  Chest  evaluation from July of 2021, abdomen and pelvis from August 14, 2019 FINDINGS: CT CHEST FINDINGS Cardiovascular: Calcified atheromatous plaque of the thoracic aorta. Calcified aortic valve. Calcified coronary artery disease. Normal heart size. No pericardial effusion. Limited assessment of vascular structures in the chest due to lack of intravenous contrast. 4.6 cm ascending thoracic aorta. Central pulmonary arteries dilated to 4.2 cm. Mediastinum/Nodes: Thoracic inlet structures are normal. No axillary lymphadenopathy. No mediastinal  lymphadenopathy. No gross hilar adenopathy. Assessment limited by lack of intravenous contrast. Esophagus normal. Lungs/Pleura: Patchy areas of tree-in-bud opacity and scattered nodules in the upper lobes with similar appearance. Interval development of similar process in the superior segment of the LEFT lower lobe but with consolidative changes at the LEFT lung base and signs of bronchial wall thickening that are new when compared to previous imaging. RIGHT lower lobe atelectasis. RIGHT lower lobe bronchial wall thickening.  Airways are patent. Musculoskeletal: No chest wall mass. See below for full musculoskeletal details. CT ABDOMEN PELVIS FINDINGS Hepatobiliary: No focal, suspicious hepatic lesion on noncontrast imaging. Marked pericholecystic stranding in the setting of cholelithiasis with distended gallbladder. Small amount of pericholecystic fluid. Pancreas: Pancreatic atrophy without peripancreatic stranding. Spleen: Spleen normal in size and contour. Adrenals/Urinary Tract: Adrenal glands are normal. Nephrolithiasis with large LEFT renal calculi. Resolution of hydronephrosis that was seen on the previous imaging study on the LEFT. Dilated lower pole collecting systems that appears to be a chronic finding similar to the prior study. RIGHT-sided nephrolithiasis. Intermediate density lesion arising from the medial upper pole of the RIGHT kidney associated with calcification measuring approximately 3.7 cm greatest dimension, similar size as measured by this observer on the prior study. Urinary bladder is normal. Stomach/Bowel: Stomach under distended. Mild stranding adjacent to the duodenum presumably related to adjacent acute cholecystitis. No acute small bowel process. Post RIGHT hemicolectomy with secondary thickening also of the ileum in the RIGHT upper quadrant. Mild rectal thickening in the setting of marked rectal distension with mild perirectal stranding. The rectum measuring approximately 9 x 11 cm. No  current pneumatosis. Vascular/Lymphatic: Marked calcific atheromatous plaque of the abdominal aorta. No aneurysmal dilation. No adenopathy. No pelvic adenopathy. Calcific atheromatous plaque tracks into the pelvis, iliac vessels. Reproductive: Calcifications of the prostate, mass-effect upon the gland secondary to marked rectal distension. Prostate not well evaluated. Other: No free air.  No abscess. Musculoskeletal: Spinal degenerative changes. No acute or destructive bone finding. IMPRESSION: 1. Acute cholecystitis. Interval development of consolidative changes at the LEFT lung base and signs of bronchial wall thickening. Findings may represent pneumonia or aspiration. 2. Signs of potential aspiration superimposed on chronic infection. 3. Fecal impaction with signs of stercoral colitis. 4. Bilateral nephrolithiasis with resolution of hydronephrosis that was seen on the previous imaging study on the LEFT. 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 The large cystic area at the lower aspect of the LEFT kidney is likely related to markedly dilated caliceal elements. 5. Bilateral nephrolithiasis otherwise similar to the previous study. 6. **An incidental finding of potential clinical significance has been found. Partially calcified lesion arising from the medial, posterior hilar lip of the RIGHT kidney may represent a solid renal neoplasm. Could consider renal sonogram as an initial means of evaluation or follow-up MRI in 3-6 months. Findings unchanged compared to June 30th of 2021. ** 7. 4.6 cm ascending thoracic aorta. Calcified aortic valve. Calcified coronary artery disease. Ascending thoracic aortic aneurysm. Recommend semi-annual imaging  followup by CTA or MRA and referral to cardiothoracic surgery if not already obtained as warranted. This recommendation follows 2010 ACCF/AHA/AATS/ACR/ASA/SCA/SCAI/SIR/STS/SVM  Guidelines for the Diagnosis and Management of Patients With Thoracic Aortic Disease. Circulation. 2010; 121: U132-G401. Aortic aneurysm NOS (ICD10-I71.9) 8. Dilated main pulmonary artery can be seen in the setting of pulmonary arterial hypertension. Aortic Atherosclerosis (ICD10-I70.0). Electronically Signed   By: Zetta Bills M.D.   On: 10/05/2019 15:48    Scheduled Meds: . scopolamine  1 patch Transdermal Q72H  . sodium chloride flush  3 mL Intravenous Q12H   Continuous Infusions: . lactated ringers 10 mL/hr at 10/08/19 1030     LOS: 1 day   Para Skeans, MD [facility] Pager [phone] If 7PM-7AM, please contact night-coverage [email or web address] Password [facility] 10/08/2019, 3:07 PM

## 2019-10-08 NOTE — Consult Note (Signed)
Consultation Note Date: 10/08/2019   Patient Name: Shane Grant  DOB: [birth year]  MRN: [id number]  Age / Sex: 86 y.o., male   PCP: Venia Carbon, MD Referring Physician: Para Skeans, MD   REASON FOR CONSULTATION:Establishing goals of care  Palliative Care consult requested for goals of care discussion in this 86 y.o. male with multiple medical problems including Kidney stones/obstructive uropathy,Chronic kidney disease,Type 2 diabetes,5 cm ascending aorta aneurysm, and atrial fibrillation. He presented to ED with complaints of abdominal pain, hypoglycemia, and hypotension per nursing facility [facility] staff. Patient receiving treatment for sepsis with lactic acid of 7.9. Patient continued to decline since admission and family requesting to focus more of comfort.    Clinical Assessment and Goals of Care: I have reviewed medical records including lab results, imaging, Epic notes, and MAR, received report from the bedside RN, and assessed the patient. I met at the bedside with patient's wife and 2 sons to discuss diagnosis prognosis, GOC, EOL wishes, disposition and options.  I introduced Palliative Medicine as specialized medical care for people living with serious illness. It focuses on providing relief from the symptoms and stress of a serious illness. The goal is to improve quality of life for both the patient and the family. Family verbalized understanding and appreciation.   We discussed a brief life review of the patient, along with his functional and nutritional status. Wife reports they have lived at [facility] for several years. 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 They have 3 sons. Patient enjoyed spending time with family and friends and also golfing.   Prior to admission family reports decline in patient's ability to provide self care. He was experiencing noticeable confusion and agitation.   We discussed His current illness and what it  means in the larger context of His on-going co-morbidities. Natural disease trajectory and expectations at EOL were discussed.  I attempted to elicit values and goals of care important to the patient.    Family confirms their goal for patient to receive all care focused on comfort and dignity. They share he would not want to suffer or live knowing his quality of life was poor. Therapeutic listening and support given. DNR/DNI confirmed. Education provided on what comfort care measures look like for patient while hospitalized. We discussed discontinuing medical interventions not focused on comfort including labs, IV fluids, and radiology testing. Family verbalized understanding again, confirming their agreement with comfort care.   Education provided as requested regarding expectations during end-of-life. Family verbalized understanding and appreciation.   Wife is tearful in discussion expressing appreciation of [name] being in a comfortable and restful state with no obvious signs of suffering. Family anticipating hospital death.   Questions and concerns were addressed.  The family was encouraged to call with questions or concerns.  PMT will continue to support holistically.   SOCIAL HISTORY:     reports that he has quit smoking. His smoking use included cigars. He has never used smokeless tobacco. He reports current alcohol use. He reports that he does not use drugs.  CODE STATUS: DNR  ADVANCE DIRECTIVES: Primary Decision Maker: Rebecca Eaton (wife)   SYMPTOM MANAGEMENT: see below   Palliative Prophylaxis:   Frequent Pain Assessment and Oral Care  PSYCHO-SOCIAL/SPIRITUAL:  Support System: Family  Desire for further Chaplaincy support: NO   Additional Recommendations (Limitations, Scope, Preferences):  Full Comfort Care  Education on hospice    PAST MEDICAL HISTORY: Past Medical  History:  Diagnosis Date  . Colon cancer ([place]) 2003   Partial colectomy for cancer in polyp    . Diabetes mellitus   . Hyperlipidemia   . Hypertension ~1997  . Kidney stones 2000   lithotripsy  . Urge incontinence of urine     ALLERGIES:  is allergic to invokana [canagliflozin] and ancef [cefazolin sodium].   MEDICATIONS:  Current Facility-Administered Medications  Medication Dose Route Frequency Provider Last Rate Last Admin  . lactated ringers infusion   Intravenous Continuous Para Skeans, MD 75 mL/hr at 10/08/19 0300 Rate Verify at 10/08/19 0300  . morphine 2 MG/ML injection 2 mg  2 mg Intravenous Q3H PRN Para Skeans, MD   2 mg at 10/07/19 1647  . scopolamine (TRANSDERM-SCOP) 1 MG/3DAYS 1.5 mg  1 patch Transdermal Q72H Para Skeans, MD   1.5 mg at 10/07/19 5093  . sodium chloride flush (NS) 0.9 % injection 3 mL  3 mL Intravenous Q12H Para Skeans, MD   3 mL at 10/13/2019 2226  . sodium chloride flush (NS) 0.9 % injection 3 mL  3 mL Intravenous PRN Para Skeans, MD        VITAL SIGNS: BP (!) 98/59 (BP Location: Left Wrist)   Pulse 83   Temp (!) 97.4 F (36.3 C) (Oral)   Resp 16   Ht 6' 6"  (1.981 m)   Wt 92.6 kg   SpO2 97%   BMI 23.59 kg/m  Filed Weights   09/26/2019 1349 10/07/19 0214  Weight: 94.1 kg 92.6 kg    Estimated body mass index is 23.59 kg/m as calculated from the following:   Height as of this encounter: 6' 6"  (1.981 m).   Weight as of this encounter: 92.6 kg.  LABS: CBC:    Component Value Date/Time   WBC 39.8 (H) 09/24/2019 1355   HGB 9.6 (L) 10/04/2019 1355   HCT 30.8 (L) 09/20/2019 1355   PLT 479 (H) 09/27/2019 1355   Comprehensive Metabolic Panel:    Component Value Date/Time   NA 140 10/02/2019 1355   K 4.9 09/23/2019 1355   BUN 81 (H) 09/21/2019 1355   CREATININE 4.08 (H) 09/25/2019 1355   ALBUMIN 2.3 (L) 09/27/2019 1355     Review of Systems  Unable to perform ROS: Acuity of condition    Physical Exam General: NAD, frail chronically-ill appearing Cardiovascular: irregular  Pulmonary: diminished bilaterally   Neurological: minimally responsive, will intermittently open eyes, nonverbal     Prognosis: Hours - Days  Discharge Planning:  Anticipated Hospital Death  Recommendations: . DNR/DNI-as confirmed by family . Full comfort/EOL care . Continue with comfort orders . Will discontinue medications/interventions not comfort focused Morphine PRN for pain/air hunger/comfort Robinul PRN for excessive secretions Ativan PRN for agitation/anxiety Zofran PRN for nausea Liquifilm tears PRN for dry eyes May have comfort feeding Comfort cart for family Unrestricted visitations in the setting of EOL (per policy) Oxygen PRN 2L or less for comfort. No escalation.  Marland Kitchen PMT will continue to support and follow. Please call team line with urgent needs.   Palliative Performance Scale: PPS 10%               Family expressed understanding and was in agreement with this plan.   Thank you for allowing the Palliative Medicine Team to assist in the care of this patient.  Time In: 0945 Time Out: 1035 Time Total: 50 min.   Visit consisted of counseling and education dealing with the complex  and emotionally intense issues of symptom management and palliative care in the setting of serious and potentially life-threatening illness.Greater than 50%  of this time was spent counseling and coordinating care related to the above assessment and plan.  Signed by:  Alda Lea, AGPCNP-BC Palliative Medicine Team  Phone: [phone] Pager: [phone] Amion: Bjorn Pippin

## 2019-10-09 DIAGNOSIS — E872 Acidosis: Secondary | ICD-10-CM

## 2019-10-09 DIAGNOSIS — N401 Enlarged prostate with lower urinary tract symptoms: Secondary | ICD-10-CM

## 2019-10-09 DIAGNOSIS — N138 Other obstructive and reflux uropathy: Secondary | ICD-10-CM

## 2019-10-09 NOTE — Progress Notes (Signed)
Daily Progress Note   Patient Name: Shane Grant       Date: 10/09/2019 DOB: 1931/12/23  Age: 86 y.o. MRN#: [id number] Attending Physician: Sharen Hones, MD Primary Care Physician: Venia Carbon, MD Admit Date: 09/21/2019  Reason for Consultation/Follow-up: Non pain symptom management, Pain control, Psychosocial/spiritual support and Terminal Care  Subjective: Patient much more unresponsive. Appears comfortable. No acute distress. Remains on full comfort care.   Son Wilfred Lacy is at the bedside. Further education provided regarding EOL changes. Wilfred Lacy shared he and his mother visited the hospice home earlier today for consideration of patient transferring there versus being in the hospital. Support provided.   Education provided as requested regarding hospice home care compared to comfort care while hospitalized. Wilfred Lacy verbalized understanding of the care from hospice and their goals and philosophy. Patient's wife and family would like patient to be considered for transfer to hospice home to allow patient to be in a more home like setting. Support provided. Education provided on the referral and acceptance process. Family verbalized understanding. They are aware there is not an available bed to transfer Mr. Gauthier to on today, however once approved he will be placed on the waiting list.   Education provided on possibility of in hospital death prior to transfer. Family verbalizes understanding expressing their appreciation of care and support doing these difficult moments.   Family confirms son, Wilfred Lacy will be the primary POC for family. I have added his contact information to patient's chart.   All questions answered and support provided.   Length of Stay: 2 days  Vital Signs: BP (!) 92/50 (BP Location: Right Arm)   Pulse 87   Temp 97.8 F (36.6 C) (Oral)   Resp 16   Ht 6\' 6"  (1.981 m)   Wt 92.6 kg   SpO2 96%   BMI 23.59 kg/m  SpO2: SpO2: 96 % O2 Device: O2 Device: Nasal  Cannula O2 Flow Rate: O2 Flow Rate (L/min): 2 L/min  Physical Exam: -comfort care, unresponsive, NAD -shallow respirations               Palliative Care Assessment & Plan   HPI: Palliative Care consult requested for goals of care discussion in this 86 y.o. male with multiple medical problems including Kidney stones/obstructive uropathy,Chronic kidney disease,Type 2 diabetes,5 cm ascending aorta aneurysm, and atrial fibrillation. He presented to ED with complaints of abdominal pain, hypoglycemia, and hypotension per nursing facility [facility]) staff. Patient receiving treatment for sepsis with lactic acid of 7.9. Patient continued to decline since admission and family requesting to focus more of comfort.    Code Status:  DNR  Goals of Care/Recommendations:  Remains full code  Family requesting referral to hospice home in [place]. They visited facility earlier today. Education provided. (referral placed to [facility] and Santiago Glad, RN [facility] Liaison) notified of family's request. Family aware patient could pass away prior to ability to transfer or prior to bed availability.   PRN medications for symptom management with a goal of comfort.   Prognosis: Hours - Days  Discharge Planning: Hospice facility vs hospital death   Care plan was discussed with patient's family, Dr. Roosevelt Locks, RN, and Santiago Glad, RN [facility] Liaison)   Thank you for allowing the Palliative Medicine Team to assist in the care of this patient.  Time Total: 40 min.   Visit consisted of counseling and education dealing with the complex and emotionally intense issues of symptom management and palliative care in the setting of serious and potentially life-threatening  illness.Greater than 50%  of this time was spent counseling and coordinating care related to the above assessment and plan.  Alda Lea, AGPCNP-BC  Palliative Medicine Team [phone]

## 2019-10-09 NOTE — Progress Notes (Signed)
PROGRESS NOTE    Shane Grant  JYN:[id number] DOB: October 25, 1931 DOA: 09/23/2019 PCP: Venia Carbon, MD    Brief Narrative:  Patient admitted was severe sepsis and found to have cholecystitis, urinary tract infection, pneumonia.  Patient's prognosis is guarded.  Family understands does not want heroic measures or life prolonging measures or put him through any pain or further suffering.  Initially wife Ms. Shane Grant and son Shane Grant wanted [name] to be treated with IV antibiotics and pain medications to be made comfortable.    Patient was changed to comfort care only 8/23..   Assessment & Plan:   Principal Problem:   Severe sepsis ([place]) Active Problems:   Hypertension   Type 2 diabetes, controlled, with neuropathy ([place])   History of colon cancer   Kidney stones   BPH with obstruction/lower urinary tract symptoms   Obstructive uropathy   Metabolic acidosis with increased anion gap and reduced excretion of inorganic acids  Patient is unresponsive, but in no distress.  Continue comfort care measures.  Looks like patient to life expectancy is probably about 5 days, discussed with patient and family as well as with the palliative care, patient can be transferred to inpatient hospice if a bed is available.     Subjective: Patient examined responsive, does not seem to have any short of breath.  No pain.  He is not eating.  No nausea vomiting.  Objective: Vitals:   10/08/19 0116 10/08/19 0400 10/08/19 0529 10/08/19 1953  BP:   (!) 98/59 (!) 92/50  Pulse:   83 87  Resp: 16 16 16 16   Temp:   (!) 97.4 F (36.3 C) 97.8 F (36.6 C)  TempSrc:   Oral Oral  SpO2:   97% 96%  Weight:      Height:        Intake/Output Summary (Last 24 hours) at 10/09/2019 1442 Last data filed at 10/09/2019 0550 Gross per 24 hour  Intake 0 ml  Output 700 ml  Net -700 ml   Filed Weights   09/16/2019 1349 10/07/19 0214  Weight: 94.1 kg 92.6 kg    Examination:  General exam: Appears calm and  comfortable  Respiratory system: Clear to auscultation. Respiratory effort normal. Cardiovascular system: Regular no murmurs. Gastrointestinal system: Abdomen is nondistended, soft and nontender.  Central nervous system: Patient is unresponsive.. Extremities: Symmetric 5 x 5 power. Skin: No rashes, lesions or ulcers     Data Reviewed: I have personally reviewed following labs and imaging studies  CBC: Recent Labs  Lab 09/17/2019 1355  WBC 39.8*  NEUTROABS 35.2*  HGB 9.6*  HCT 30.8*  MCV 94.2  PLT 865*   Basic Metabolic Panel: Recent Labs  Lab 09/29/2019 1355  NA 140  K 4.9  CL 105  CO2 18*  GLUCOSE 60*  BUN 81*  CREATININE 4.08*  CALCIUM 9.1  MG 1.8   GFR: Estimated Creatinine Clearance: 17.1 mL/min (A) (by C-G formula based on SCr of 4.08 mg/dL (H)). Liver Function Tests: Recent Labs  Lab 10/08/2019 1355  AST 59*  ALT 37  ALKPHOS 167*  BILITOT 1.0  PROT 7.6  ALBUMIN 2.3*   Recent Labs  Lab 10/05/2019 1355  LIPASE 36   No results for input(s): AMMONIA in the last 168 hours. Coagulation Profile: Recent Labs  Lab 10/14/2019 1637  INR 1.4*   Cardiac Enzymes: No results for input(s): CKTOTAL, CKMB, CKMBINDEX, TROPONINI in the last 168 hours. BNP (last 3 results) No results for input(s): PROBNP in the  last 8760 hours. HbA1C: No results for input(s): HGBA1C in the last 72 hours. CBG: Recent Labs  Lab 10/05/2019 1641 10/09/2019 2217 09/24/2019 2351  GLUCAP 266* 41* 104*   Lipid Profile: No results for input(s): CHOL, HDL, LDLCALC, TRIG, CHOLHDL, LDLDIRECT in the last 72 hours. Thyroid Function Tests: No results for input(s): TSH, T4TOTAL, FREET4, T3FREE, THYROIDAB in the last 72 hours. Anemia Panel: No results for input(s): VITAMINB12, FOLATE, FERRITIN, TIBC, IRON, RETICCTPCT in the last 72 hours. Sepsis Labs: Recent Labs  Lab 10/09/2019 1355 09/30/2019 1637 10/05/2019 1923  PROCALCITON 1.43  --   --   LATICACIDVEN 4.5* 6.9* 7.9*    Recent Results (from  the past 240 hour(s))  SARS Coronavirus 2 by RT PCR (hospital order, performed in [facility] hospital lab) Nasopharyngeal Nasopharyngeal Swab     Status: None   Collection Time: 10/11/2019  1:55 PM   Specimen: Nasopharyngeal Swab  Result Value Ref Range Status   SARS Coronavirus 2 NEGATIVE NEGATIVE Final    Comment: (NOTE) SARS-CoV-2 target nucleic acids are NOT DETECTED.  The SARS-CoV-2 RNA is generally detectable in upper and lower respiratory specimens during the acute phase of infection. The lowest concentration of SARS-CoV-2 viral copies this assay can detect is 250 copies / mL. A negative result does not preclude SARS-CoV-2 infection and should not be used as the sole basis for treatment or other patient management decisions.  A negative result may occur with improper specimen collection / handling, submission of specimen other than nasopharyngeal swab, presence of viral mutation(s) within the areas targeted by this assay, and inadequate number of viral copies (<250 copies / mL). A negative result must be combined with clinical observations, patient history, and epidemiological information.  Fact Sheet for Patients:   StrictlyIdeas.no  Fact Sheet for Healthcare Providers: BankingDealers.co.za  This test is not yet approved or  cleared by the [place] FDA and has been authorized for detection and/or diagnosis of SARS-CoV-2 by FDA under an Emergency Use Authorization (EUA).  This EUA will remain in effect (meaning this test can be used) for the duration of the COVID-19 declaration under Section 564(b)(1) of the Act, 21 U.S.C. section 360bbb-3(b)(1), unless the authorization is terminated or revoked sooner.  Performed at [facility], [address] [place], [place] [address]   Blood culture (routine x 2)     Status: None (Preliminary result)   Collection Time: 10/14/2019  1:55 PM   Specimen: BLOOD  Result Value  Ref Range Status   Specimen Description BLOOD BLOOD RIGHT HAND  Final   Special Requests   Final    BOTTLES DRAWN AEROBIC AND ANAEROBIC Blood Culture adequate volume   Culture   Final    NO GROWTH 3 DAYS Performed at [facility], [address], [place], [place] [address]    Report Status PENDING  Incomplete  Blood culture (routine x 2)     Status: None (Preliminary result)   Collection Time: 09/28/2019  1:55 PM   Specimen: BLOOD  Result Value Ref Range Status   Specimen Description BLOOD BLOOD RIGHT FOREARM  Final   Special Requests   Final    BOTTLES DRAWN AEROBIC AND ANAEROBIC Blood Culture adequate volume   Culture   Final    NO GROWTH 3 DAYS Performed at [facility], [address] [place], [place] [address]    Report Status PENDING  Incomplete         Radiology Studies: No results found.  Scheduled Meds: . scopolamine  1 patch Transdermal Q72H  . sodium chloride flush  3 mL Intravenous Q12H   Continuous Infusions: . lactated ringers 10 mL/hr at 10/08/19 1030     LOS: 2 days    Time spent: 25 minutes    Sharen Hones, MD [facility]   To contact the attending provider between 7A-7P or the covering provider during after hours 7P-7A, please log into the web site [email or web address] and access using universal [place] password for that web site. If you do not have the password, please call the hospital operator.  10/09/2019, 2:42 PM

## 2019-10-09 NOTE — Progress Notes (Signed)
2mg  of Morphine given one time as the patient seem to have rapid breathing. After morphine given the patient has been stable. Family is at bedside. Q2 turns performed.

## 2019-10-09 NOTE — Progress Notes (Signed)
Vital signs stable obtained at shift change.

## 2019-10-11 LAB — CULTURE, BLOOD (ROUTINE X 2)
Culture: NO GROWTH
Culture: NO GROWTH
Special Requests: ADEQUATE
Special Requests: ADEQUATE

## 2019-10-16 NOTE — TOC Initial Note (Signed)
Transition of Care [facility]) - Initial/Assessment Note    Patient Details  Name: Shane Grant MRN: [id number] Date of Birth: 07/10/1931  [facility]) CM/SW Contact:    Beverly Sessions, RN Phone Number: 10/29/19, 9:04 AM  Clinical Narrative:                  Lexine Baton with palliative has discussed discharge disposition  Patient is for inpatient hospice facility.  She has offered preference to the family and they selected the hospice home in [place].  She has place a referral to [place] with [facility].   Please consult TOC if any needs arise    Expected Discharge Plan: [place]     Patient Goals and CMS Choice        Expected Discharge Plan and Services Expected Discharge Plan: [place]                                              Prior Living Arrangements/Services                       Activities of Daily Living Home Assistive Devices/Equipment: Eyeglasses, Gilford Rile (specify type) ADL Screening (condition at time of admission) Patient's cognitive ability adequate to safely complete daily activities?: No Is the patient deaf or have difficulty hearing?: Yes Does the patient have difficulty seeing, even when wearing glasses/contacts?: No Does the patient have difficulty concentrating, remembering, or making decisions?: Yes Patient able to express need for assistance with ADLs?: Yes Does the patient have difficulty dressing or bathing?: Yes Independently performs ADLs?: No Communication: Independent Dressing (OT): Dependent Is this a change from baseline?: Pre-admission baseline Grooming: Dependent Is this a change from baseline?: Pre-admission baseline Feeding: Independent Bathing: Dependent Is this a change from baseline?: Pre-admission baseline Toileting: Dependent Is this a change from baseline?: Pre-admission baseline In/Out Bed: Dependent Is this a change from baseline?: Pre-admission  baseline Walks in Home: Needs assistance Is this a change from baseline?: Pre-admission baseline Does the patient have difficulty walking or climbing stairs?: Yes Weakness of Legs: Both Weakness of Arms/Hands: None  Permission Sought/Granted                  Emotional Assessment              Admission diagnosis:  Acute cholecystitis [K81.0] Fecal impaction (HCC) [K56.41] Troponin I above reference range [R77.8] AKI (acute kidney injury) ([place]) [N17.9] Atrial fibrillation with RVR ([place]) [I48.91] Septic shock ([place]) [A41.9, R65.21] Severe sepsis ([place]) [A41.9, R65.20] Aspiration pneumonia, unspecified aspiration pneumonia type, unspecified laterality, unspecified part of lung ([place]) [J69.0] Patient Active Problem List   Diagnosis Date Noted  . Metabolic acidosis with increased anion gap and reduced excretion of inorganic acids 10/03/2019  . Severe sepsis ([place]) 09/28/2019  . Ascending aorta dilatation (HCC) 10/03/2019  . Generalized weakness 08/16/2019  . Right leg pain 08/16/2019  . Obstructive uropathy 08/15/2019  . BPH with obstruction/lower urinary tract symptoms 03/01/2019  . Leg ulcer ([place]) 03/01/2019  . Orthostatic dizziness 08/22/2018  . Aortic stenosis 06/04/2014  . Advanced directives, counseling/discussion 12/04/2013  . Routine general medical examination at a health care facility 10/03/2011  . Hypertension   . Hyperlipidemia   . Type 2 diabetes, controlled, with neuropathy ([place])   . History of colon cancer   . Kidney stones  PCP:  Venia Carbon, Shane Pharmacy:   [facility] [place], [place] [address] [place] [address] Phone: [phone] Fax: [phone]     Social Determinants of Health (SDOH) Interventions    Readmission Risk Interventions No flowsheet data found.

## 2019-10-16 NOTE — Care Management Important Message (Signed)
Important Message  Patient Details  Name: Shane Grant MRN: [id number] Date of Birth: 03-18-1931   Medicare Important Message Given:  Other (see comment)  Patient expired 2019/10/12.     Dannette Barbara 2019-10-12, 10:40 AM

## 2019-10-16 NOTE — Progress Notes (Signed)
[facility] hospital Liaison note:  New referral for [facility] hospice home received from Palliative NP [place] on the evening of 8/25. Patient information sent to referral.  [facility] was unable to offer a bed this evening.   Writer up to the floor to see patient and family this morning, patient had died prior to visit.  Referral notified.  Flo Shanks BSN RN, [place] [phone]

## 2019-10-16 NOTE — Discharge Summary (Signed)
Death Note:  Patient admitted was severe sepsis and found to have cholecystitis, urinary tract infection, pneumonia. Patient's prognosis is guarded. Family understands does not want heroic measures or life prolonging measures or put him through any pain or further suffering. Initially wife Ms. Doris and son Sherren Mocha wanted Mr. Jamaris to be treated with IV antibiotics and pain medications to be made comfortable.   Patient was changed to comfort care only 2022-10-15.Marland Kitchen  Patient died in peace on 10-18-2022 2021 at 916 hours.  Diagnosis:  Principal Problem:   Severe sepsis (HCC) Active Problems:   Hypertension   Type 2 diabetes, controlled, with neuropathy (HCC)   History of colon cancer   Kidney stones   BPH with obstruction/lower urinary tract symptoms   Obstructive uropathy   Metabolic acidosis with increased anion gap and reduced excretion of inorganic acids Aspiration pneumonia bilateral lower lobes Acute kidney injury secondary to severe sepsis Acute cholecystitis   Cause of death. Severe sepsis Acute cholecystitis Aspiration pneumonia Obstructive uropathy with acute kidney injury.

## 2019-10-16 DEATH — deceased

## 2019-11-04 ENCOUNTER — Ambulatory Visit: Payer: Medicare Other | Admitting: Family

## 2019-12-04 ENCOUNTER — Ambulatory Visit: Payer: Medicare Other | Admitting: Urology

## 2019-12-19 ENCOUNTER — Ambulatory Visit: Payer: Self-pay | Admitting: Urology

## 2021-01-14 IMAGING — CR DG CHEST 2V
4 series · 4 of 4 positions shown · non-contrast
Comparison: None.

CLINICAL DATA: Constipation for 5 days, weakness

EXAM:
CHEST - 2 VIEW

[chest lat (1 of 2)]
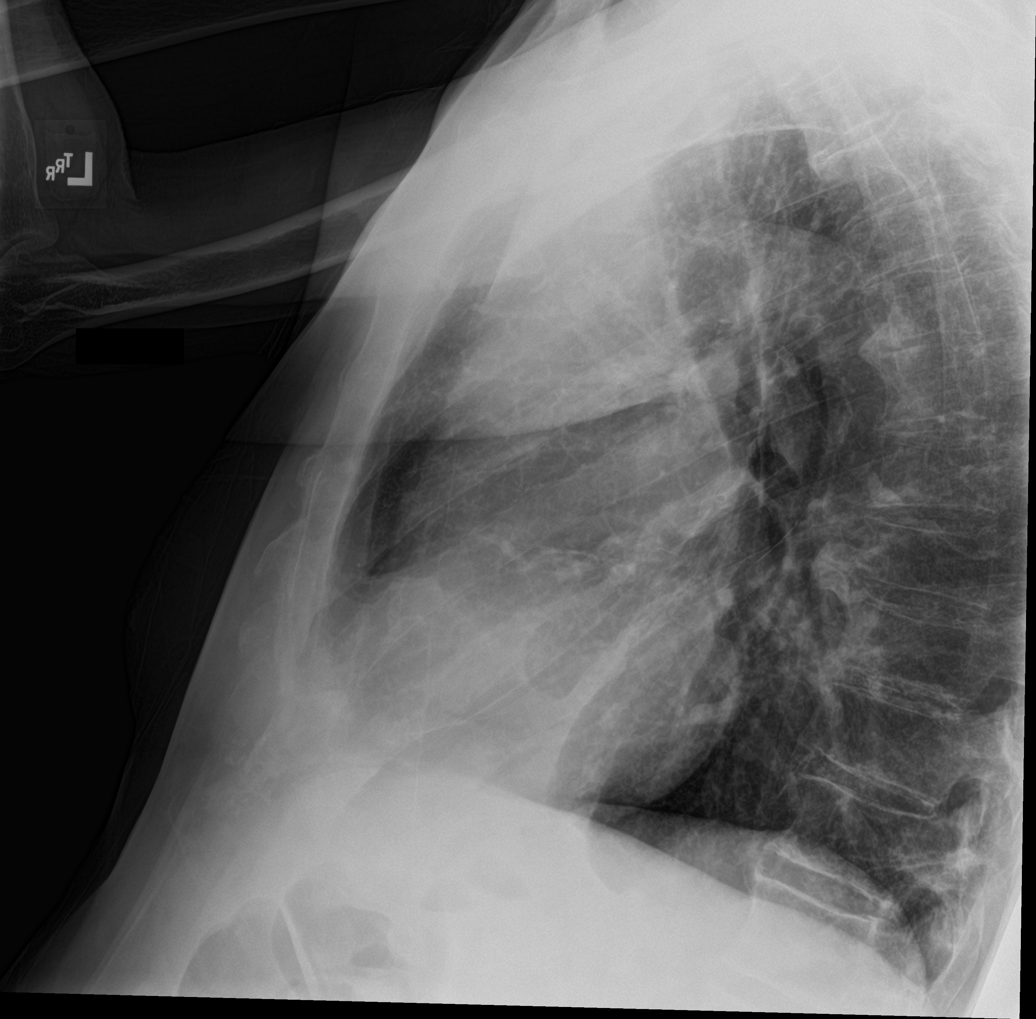

[chest ap (1 of 2)]
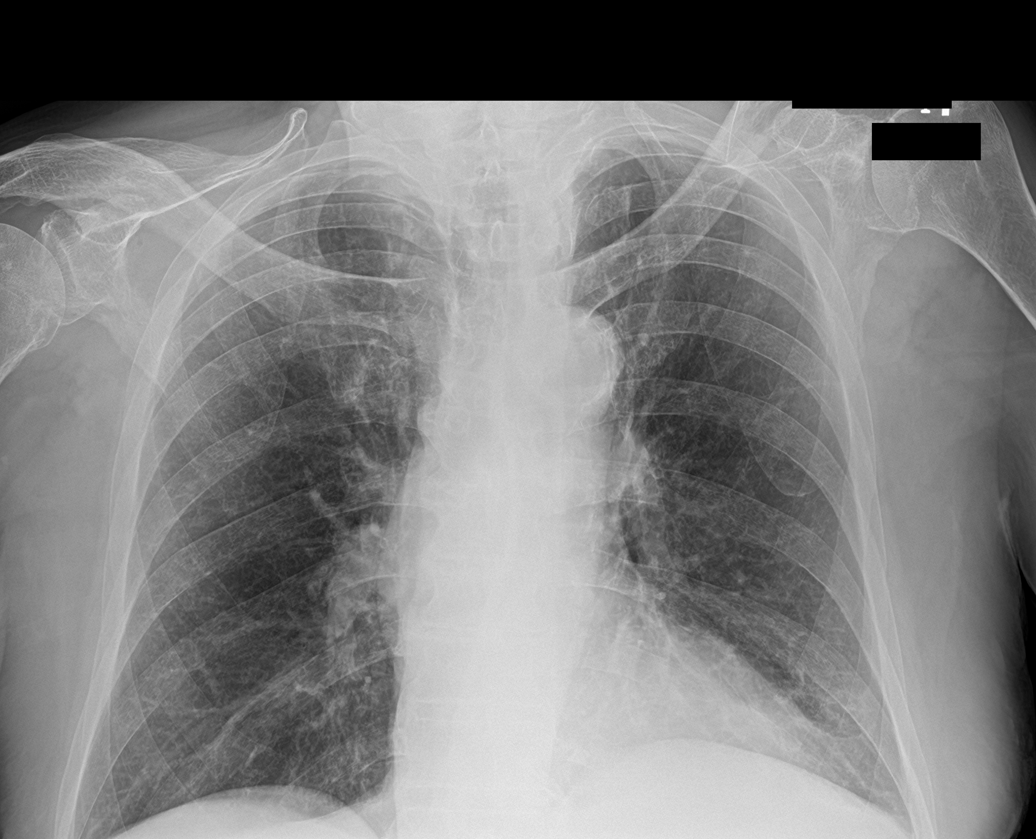

[chest lat (2 of 2)]
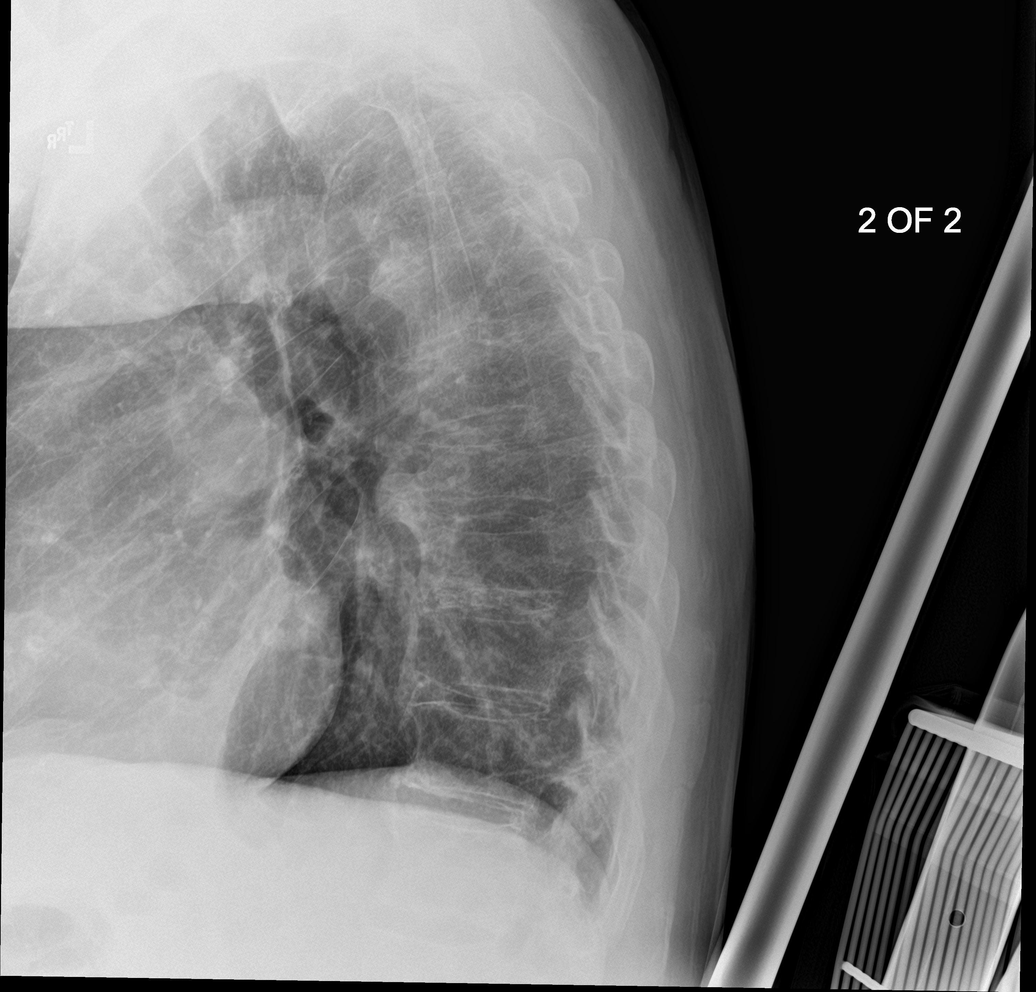

[chest ap (2 of 2)]
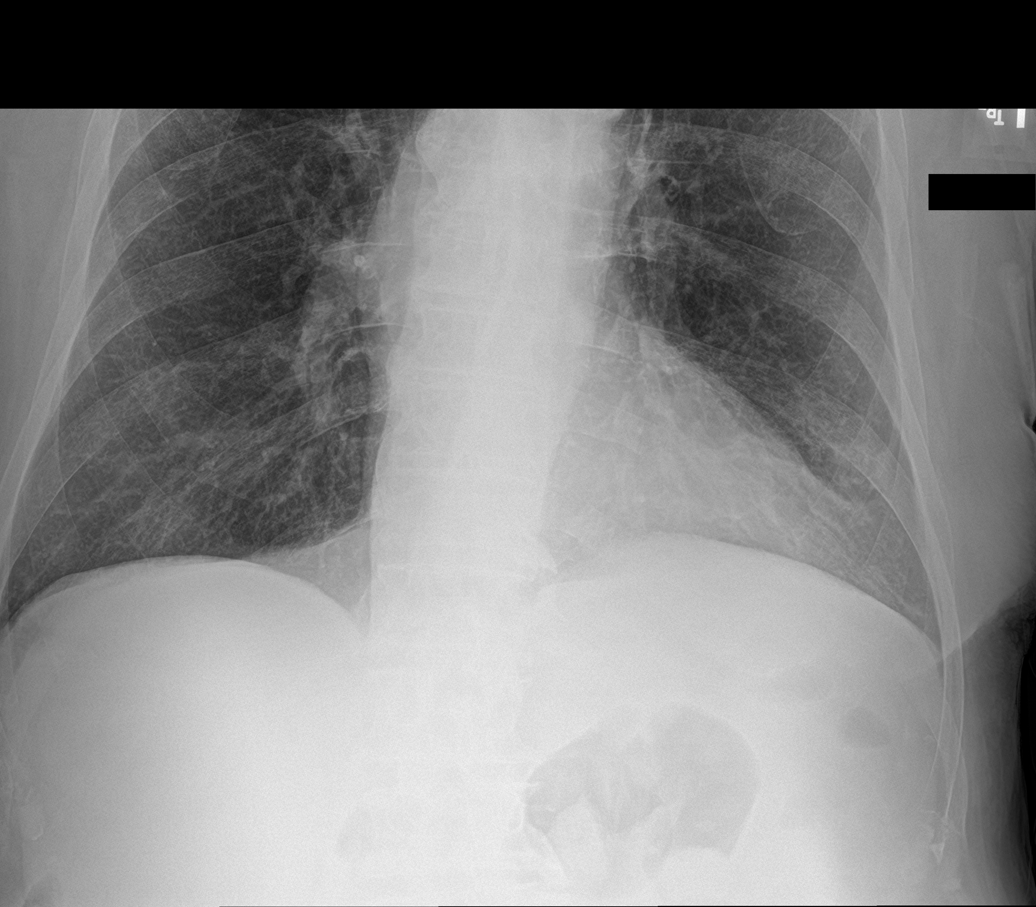

[4 of 4 positions shown; findings below may reference images not displayed]

FINDINGS: Frontal and lateral views of the chest demonstrate an unremarkable
cardiac silhouette. Atherosclerosis of the aortic arch. No acute
airspace disease, effusion, or pneumothorax. No acute bony
abnormalities.
IMPRESSION: 1. No acute intrathoracic process.

## 2021-03-08 IMAGING — CT CT ABD-PELV W/O CM
1 series · 5 of 34 positions shown, 7 images · non-contrast
Comparison: Chest evaluation from Thursday August, 2019, abdomen and pelvis
from August 14, 2019

CLINICAL DATA: Cough, persistent cough

EXAM:
CT CHEST, ABDOMEN AND PELVIS WITHOUT CONTRAST
TECHNIQUE: Multidetector CT imaging of the chest, abdomen and pelvis was
performed following the standard protocol without IV contrast.

[Series 6: sagittal · sagittal · 0.61mm/px · 5 of 229 slices shown, 7 images]
[im 29/229  mediastinal]
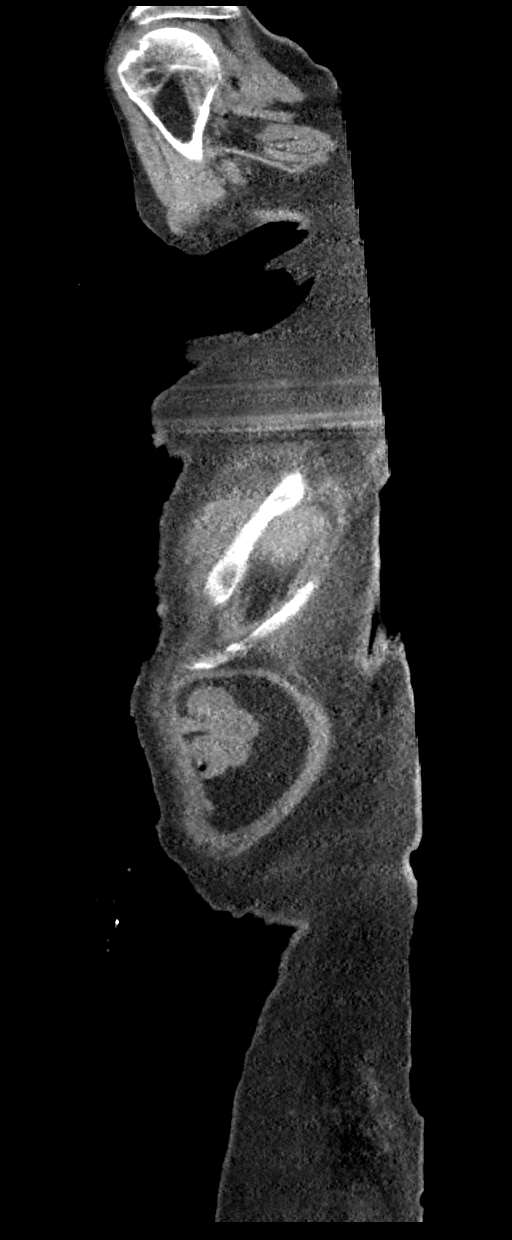
[im 29/229  lung]
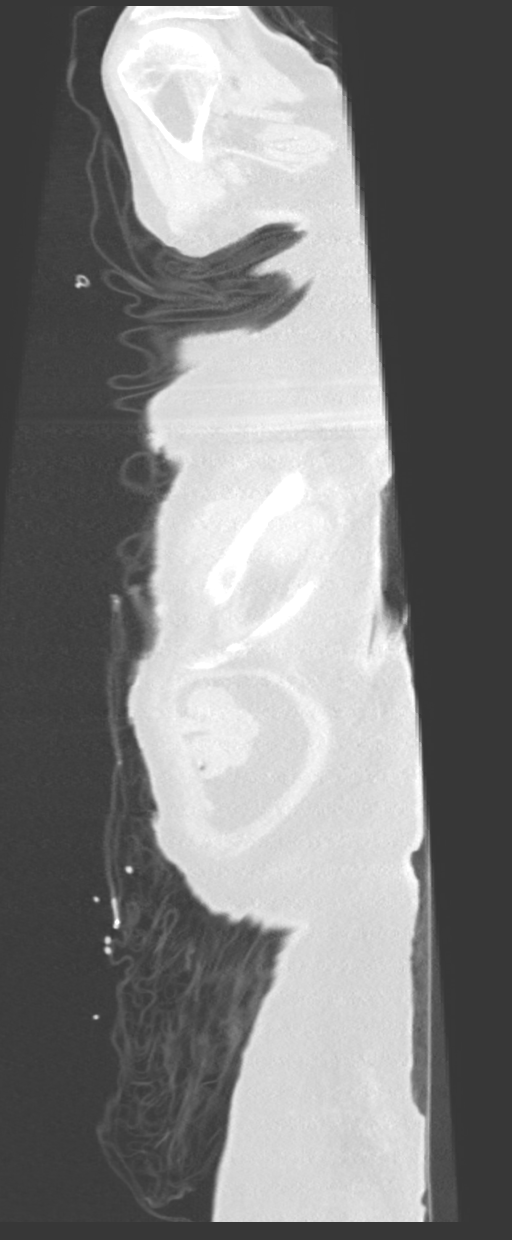
[im 68/229  lung]
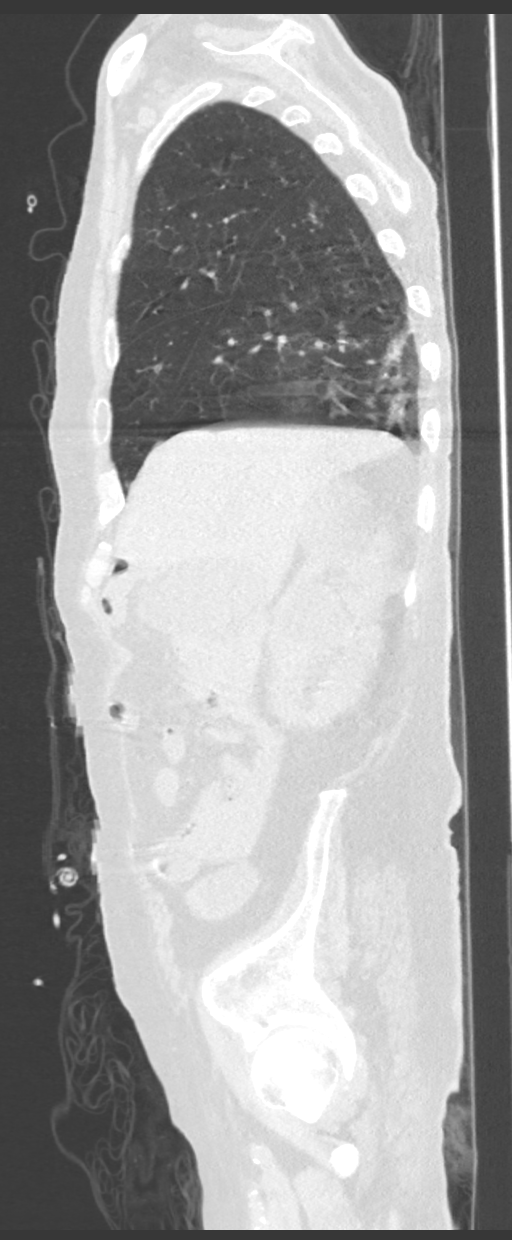
[im 119/229  lung]
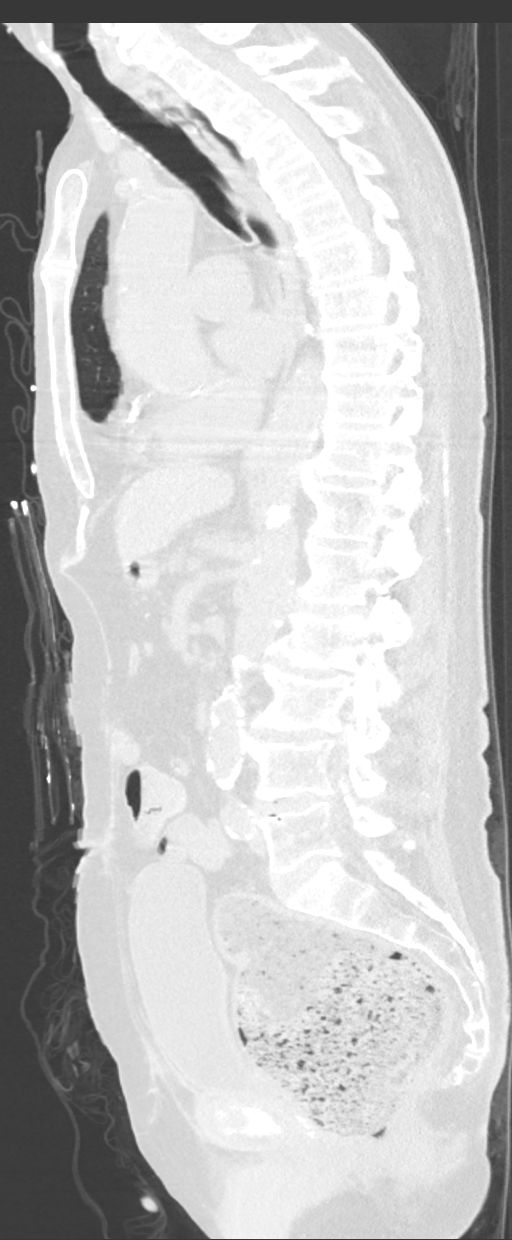
[im 161/229  lung]
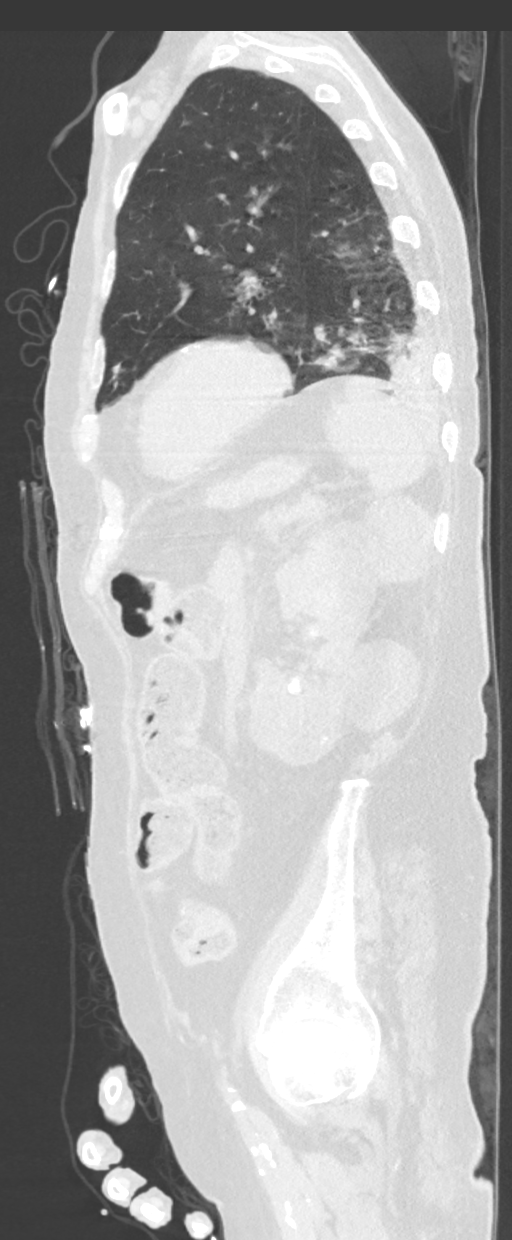
[im 201/229  mediastinal]
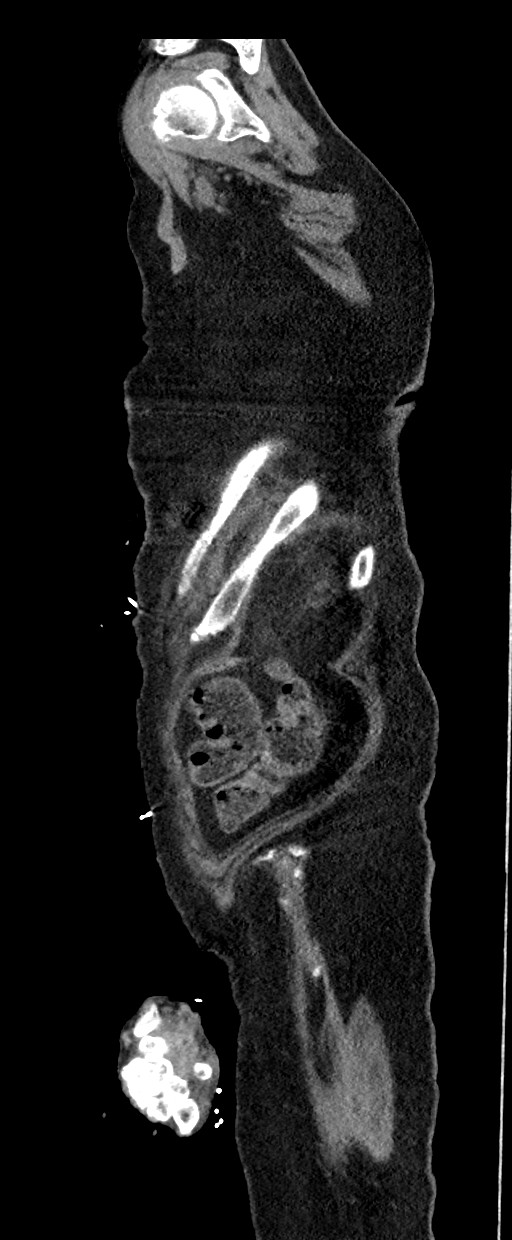
[im 201/229  lung]
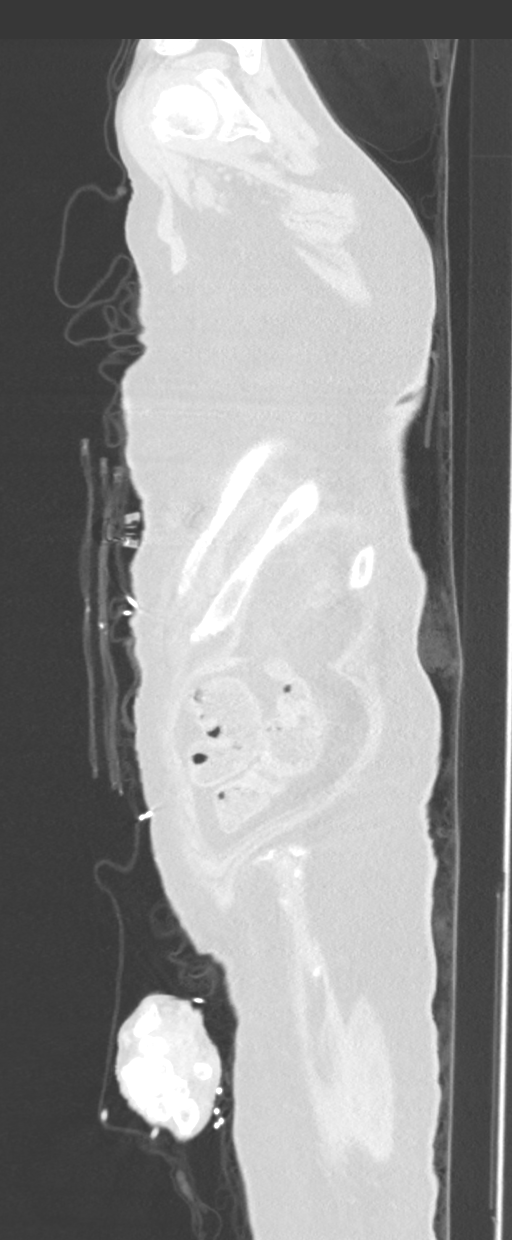

[5 of 34 positions shown; findings below may reference images not displayed]

FINDINGS: CT CHEST FINDINGS

Cardiovascular: Calcified atheromatous plaque of the thoracic aorta.
Calcified aortic valve. Calcified coronary artery disease. Normal
heart size. No pericardial effusion. Limited assessment of vascular
structures in the chest due to lack of intravenous contrast. 4.6 cm
ascending thoracic aorta.

Central pulmonary arteries dilated to 4.2 cm.

Mediastinum/Nodes: Thoracic inlet structures are normal. No axillary
lymphadenopathy. No mediastinal lymphadenopathy. No gross hilar
adenopathy. Assessment limited by lack of intravenous contrast.
Esophagus normal.

Lungs/Pleura: Patchy areas of tree-in-bud opacity and scattered
nodules in the upper lobes with similar appearance.

Interval development of similar process in the superior segment of
the LEFT lower lobe but with consolidative changes at the LEFT lung
base and signs of bronchial wall thickening that are new when
compared to previous imaging.

RIGHT lower lobe atelectasis.

RIGHT lower lobe bronchial wall thickening.  Airways are patent.

Musculoskeletal: No chest wall mass. See below for full
musculoskeletal details.

CT ABDOMEN PELVIS FINDINGS

Hepatobiliary: No focal, suspicious hepatic lesion on noncontrast
imaging.

Marked pericholecystic stranding in the setting of cholelithiasis
with distended gallbladder. Small amount of pericholecystic fluid.

Pancreas: Pancreatic atrophy without peripancreatic stranding.

Spleen: Spleen normal in size and contour.

Adrenals/Urinary Tract: Adrenal glands are normal.

Nephrolithiasis with large LEFT renal calculi. Resolution of
hydronephrosis that was seen on the previous imaging study on the
LEFT.

Dilated lower pole collecting systems that appears to be a chronic
finding similar to the prior study.

RIGHT-sided nephrolithiasis.

Intermediate density lesion arising from the medial upper pole of
the RIGHT kidney associated with calcification measuring
approximately 3.7 cm greatest dimension, similar size as measured by
this observer on the prior study.

Urinary bladder is normal.

Stomach/Bowel: Stomach under distended. Mild stranding adjacent to
the duodenum presumably related to adjacent acute cholecystitis. No
acute small bowel process. Post RIGHT hemicolectomy with secondary
thickening also of the ileum in the RIGHT upper quadrant.

Mild rectal thickening in the setting of marked rectal distension
with mild perirectal stranding. The rectum measuring approximately 9
x 11 cm. No current pneumatosis.

Vascular/Lymphatic: Marked calcific atheromatous plaque of the
abdominal aorta. No aneurysmal dilation. No adenopathy.

No pelvic adenopathy. Calcific atheromatous plaque tracks into the
pelvis, iliac vessels.

Reproductive: Calcifications of the prostate, mass-effect upon the
gland secondary to marked rectal distension. Prostate not well
evaluated.

Other: No free air.  No abscess.

Musculoskeletal: Spinal degenerative changes. No acute or
destructive bone finding.
IMPRESSION: 1. Acute cholecystitis. Interval development of consolidative
changes at the LEFT lung base and signs of bronchial wall
thickening. Findings may represent pneumonia or aspiration.
2. Signs of potential aspiration superimposed on chronic infection.
3. Fecal impaction with signs of stercoral colitis.
4. Bilateral nephrolithiasis with resolution of hydronephrosis that
was seen on the previous imaging study on the LEFT. Though still
with dilated lower pole collecting system elements, potentially due
to infundibular stricture and large calculi on the LEFT. The large
cystic area at the lower aspect of the LEFT kidney is likely related
to markedly dilated caliceal elements.
5. Bilateral nephrolithiasis otherwise similar to the previous
study.
6. **An incidental finding of potential clinical significance has
been found. Partially calcified lesion arising from the medial,
posterior hilar lip of the RIGHT kidney may represent a solid renal
neoplasm. Could consider renal sonogram as an initial means of
evaluation or follow-up MRI in 3-6 months. Findings unchanged
compared to [REDACTED] of 8988. **
7. 4.6 cm ascending thoracic aorta. Calcified aortic valve.
Calcified coronary artery disease. Ascending thoracic aortic
aneurysm. Recommend semi-annual imaging followup by CTA or MRA and
referral to cardiothoracic surgery if not already obtained as
warranted. This recommendation follows 2919
ACCF/AHA/AATS/ACR/ASA/SCA/SAPANA/SHIMPEI/AUJLA/MEHER Guidelines for the
Diagnosis and Management of Patients With Thoracic Aortic Disease.
Circulation. 2919; 121: E266-e369. Aortic aneurysm NOS (8GOHI-T5K.U)
8. Dilated main pulmonary artery can be seen in the setting of
pulmonary arterial hypertension.

Aortic Atherosclerosis (8GOHI-C77.7).
